# Patient Record
Sex: Male | Born: 1970 | Race: Black or African American | Hispanic: No | Marital: Married | Smoking: Former smoker
Health system: Southern US, Community
[De-identification: ages and names within clinical notes are randomized; demographics above are authoritative.]

## PROBLEM LIST (undated history)

## (undated) DIAGNOSIS — I5030 Unspecified diastolic (congestive) heart failure: Secondary | ICD-10-CM

## (undated) DIAGNOSIS — Z992 Dependence on renal dialysis: Secondary | ICD-10-CM

## (undated) DIAGNOSIS — N184 Chronic kidney disease, stage 4 (severe): Secondary | ICD-10-CM

## (undated) DIAGNOSIS — I77810 Thoracic aortic ectasia: Secondary | ICD-10-CM

## (undated) DIAGNOSIS — D649 Anemia, unspecified: Secondary | ICD-10-CM

## (undated) DIAGNOSIS — N186 End stage renal disease: Secondary | ICD-10-CM

## (undated) DIAGNOSIS — I340 Nonrheumatic mitral (valve) insufficiency: Secondary | ICD-10-CM

## (undated) DIAGNOSIS — I739 Peripheral vascular disease, unspecified: Secondary | ICD-10-CM

## (undated) DIAGNOSIS — E119 Type 2 diabetes mellitus without complications: Secondary | ICD-10-CM

## (undated) DIAGNOSIS — I10 Essential (primary) hypertension: Secondary | ICD-10-CM

## (undated) HISTORY — DX: Thoracic aortic ectasia: I77.810

## (undated) HISTORY — DX: Unspecified diastolic (congestive) heart failure: I50.30

## (undated) HISTORY — DX: Anemia, unspecified: D64.9

## (undated) HISTORY — DX: Peripheral vascular disease, unspecified: I73.9

## (undated) HISTORY — DX: Chronic kidney disease, stage 4 (severe): N18.4

## (undated) HISTORY — DX: Nonrheumatic mitral (valve) insufficiency: I34.0

## (undated) HISTORY — PX: LEG SURGERY: SHX1003

---

## 2008-02-05 ENCOUNTER — Emergency Department (HOSPITAL_COMMUNITY): Admission: EM | Admit: 2008-02-05 | Discharge: 2008-02-05 | Payer: Self-pay | Admitting: Emergency Medicine

## 2010-10-07 ENCOUNTER — Emergency Department (HOSPITAL_COMMUNITY)
Admission: EM | Admit: 2010-10-07 | Discharge: 2010-10-07 | Disposition: A | Discharge status: Home / Self Care | Payer: Self-pay | Attending: Emergency Medicine | Admitting: Emergency Medicine

## 2010-10-07 DIAGNOSIS — R5381 Other malaise: Secondary | ICD-10-CM | POA: Insufficient documentation

## 2010-10-07 DIAGNOSIS — R197 Diarrhea, unspecified: Secondary | ICD-10-CM | POA: Insufficient documentation

## 2010-10-07 DIAGNOSIS — Z833 Family history of diabetes mellitus: Secondary | ICD-10-CM | POA: Insufficient documentation

## 2010-10-07 DIAGNOSIS — R7309 Other abnormal glucose: Secondary | ICD-10-CM | POA: Insufficient documentation

## 2010-10-07 LAB — DIFFERENTIAL
Basophils Absolute: 0.1 10*3/uL (ref 0.0–0.1)
Eosinophils Relative: 3 % (ref 0–5)
Lymphocytes Relative: 48 % — ABNORMAL HIGH (ref 12–46)
Neutro Abs: 1.8 10*3/uL (ref 1.7–7.7)
Neutrophils Relative %: 34 % — ABNORMAL LOW (ref 43–77)

## 2010-10-07 LAB — BLOOD GAS, VENOUS
Bicarbonate: 21.1 mEq/L (ref 20.0–24.0)
O2 Saturation: 46.1 %
TCO2: 19.4 mmol/L (ref 0–100)
pH, Ven: 7.292 (ref 7.250–7.300)
pO2, Ven: 26 mmHg — CL (ref 30.0–45.0)

## 2010-10-07 LAB — URINALYSIS, ROUTINE W REFLEX MICROSCOPIC
Glucose, UA: >1000 mg/dL — AB
Hgb urine dipstick: NEGATIVE
Leukocytes, UA: NEGATIVE
Protein, ur: NEGATIVE mg/dL
Urobilinogen, UA: 0.2 mg/dL (ref 0.0–1.0)
pH: 5.5 (ref 5.0–8.0)

## 2010-10-07 LAB — BASIC METABOLIC PANEL
BUN: 18 mg/dL (ref 6–23)
CO2: 18 mEq/L — ABNORMAL LOW (ref 19–32)
Calcium: 10.1 mg/dL (ref 8.4–10.5)
Chloride: 94 mEq/L — ABNORMAL LOW (ref 96–112)
GFR calc Af Amer: >60 mL/min (ref >60)

## 2010-10-07 LAB — URINE MICROSCOPIC-ADD ON

## 2010-10-07 LAB — CBC
Hemoglobin: 15.4 g/dL (ref 13.0–17.0)
MCV: 81.6 fL (ref 78.0–100.0)
Platelets: 291 10*3/uL (ref 150–400)
RBC: 5.34 MIL/uL (ref 4.22–5.81)
WBC: 5.3 10*3/uL (ref 4.0–10.5)

## 2010-10-07 LAB — GLUCOSE, CAPILLARY
Glucose-Capillary: 212 mg/dL — ABNORMAL HIGH (ref 70–99)
Glucose-Capillary: 215 mg/dL — ABNORMAL HIGH (ref 70–99)

## 2014-11-09 ENCOUNTER — Emergency Department (INDEPENDENT_AMBULATORY_CARE_PROVIDER_SITE_OTHER)
Admission: EM | Admit: 2014-11-09 | Discharge: 2014-11-09 | Disposition: A | Discharge status: Home / Self Care | Payer: Self-pay | Source: Home / Self Care

## 2014-11-09 ENCOUNTER — Encounter (HOSPITAL_COMMUNITY): Payer: Self-pay | Admitting: Emergency Medicine

## 2014-11-09 DIAGNOSIS — R0789 Other chest pain: Secondary | ICD-10-CM

## 2014-11-09 DIAGNOSIS — M542 Cervicalgia: Secondary | ICD-10-CM

## 2014-11-09 MED ORDER — CYCLOBENZAPRINE HCL 10 MG PO TABS: 10.0000 mg | ORAL_TABLET | Freq: Two times a day (BID) | ORAL | Status: DC | PRN

## 2014-11-09 MED ORDER — NAPROXEN 375 MG PO TABS: 375.0000 mg | ORAL_TABLET | Freq: Two times a day (BID) | ORAL | Status: DC

## 2014-11-09 NOTE — Discharge Instructions (Signed)
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
## 2016-07-24 ENCOUNTER — Encounter (HOSPITAL_COMMUNITY): Payer: Self-pay

## 2016-07-24 ENCOUNTER — Emergency Department (HOSPITAL_COMMUNITY)
Admission: EM | Admit: 2016-07-24 | Discharge: 2016-07-24 | Disposition: A | Discharge status: Home / Self Care | Payer: Self-pay | Attending: Emergency Medicine | Admitting: Emergency Medicine

## 2016-07-24 DIAGNOSIS — F17210 Nicotine dependence, cigarettes, uncomplicated: Secondary | ICD-10-CM | POA: Insufficient documentation

## 2016-07-24 DIAGNOSIS — L0231 Cutaneous abscess of buttock: Secondary | ICD-10-CM | POA: Insufficient documentation

## 2016-07-24 DIAGNOSIS — E1165 Type 2 diabetes mellitus with hyperglycemia: Secondary | ICD-10-CM | POA: Insufficient documentation

## 2016-07-24 DIAGNOSIS — L03317 Cellulitis of buttock: Secondary | ICD-10-CM

## 2016-07-24 DIAGNOSIS — R739 Hyperglycemia, unspecified: Secondary | ICD-10-CM

## 2016-07-24 HISTORY — DX: Type 2 diabetes mellitus without complications: E11.9

## 2016-07-24 LAB — CBC WITH DIFFERENTIAL/PLATELET
BASOS PCT: 0 %
Basophils Absolute: 0 10*3/uL (ref 0.0–0.1)
EOS PCT: 2 %
Eosinophils Absolute: 0.3 10*3/uL (ref 0.0–0.7)
HCT: 38.9 % — ABNORMAL LOW (ref 39.0–52.0)
Hemoglobin: 13.7 g/dL (ref 13.0–17.0)
LYMPHS ABS: 2.3 10*3/uL (ref 0.7–4.0)
Lymphocytes Relative: 15 %
MCH: 29.6 pg (ref 26.0–34.0)
MCHC: 35.2 g/dL (ref 30.0–36.0)
MCV: 84 fL (ref 78.0–100.0)
MONO ABS: 1.2 10*3/uL — AB (ref 0.1–1.0)
Monocytes Relative: 8 %
NEUTROS ABS: 11.2 10*3/uL — AB (ref 1.7–7.7)
Neutrophils Relative %: 75 %
Platelets: 261 10*3/uL (ref 150–400)
RBC: 4.63 MIL/uL (ref 4.22–5.81)
RDW: 12.5 % (ref 11.5–15.5)
WBC: 15 10*3/uL — ABNORMAL HIGH (ref 4.0–10.5)

## 2016-07-24 LAB — BASIC METABOLIC PANEL
Anion gap: 11 (ref 5–15)
BUN: 11 mg/dL (ref 6–20)
CALCIUM: 8.9 mg/dL (ref 8.9–10.3)
CHLORIDE: 97 mmol/L — AB (ref 101–111)
CO2: 21 mmol/L — AB (ref 22–32)
CREATININE: 1.01 mg/dL (ref 0.61–1.24)
GFR calc non Af Amer: >60 mL/min (ref >60)
GLUCOSE: 390 mg/dL — AB (ref 65–99)
Potassium: 4.1 mmol/L (ref 3.5–5.1)
Sodium: 129 mmol/L — ABNORMAL LOW (ref 135–145)

## 2016-07-24 MED ORDER — HYDROMORPHONE HCL 1 MG/ML IJ SOLN
1.0000 mg | Freq: Once | INTRAMUSCULAR | Status: AC
  Administered 2016-07-24: 1 mg via INTRAMUSCULAR
  Filled 2016-07-24: qty 1

## 2016-07-24 MED ORDER — LIDOCAINE-EPINEPHRINE (PF) 2 %-1:200000 IJ SOLN
20.0000 mL | Freq: Once | INTRAMUSCULAR | Status: AC
  Administered 2016-07-24: 20 mL
  Filled 2016-07-24: qty 20

## 2016-07-24 MED ORDER — HYDROCODONE-ACETAMINOPHEN 5-325 MG PO TABS: 1.0000 | ORAL_TABLET | ORAL | 0 refills | Status: DC | PRN

## 2016-07-24 MED ORDER — IBUPROFEN 600 MG PO TABS: 600.0000 mg | ORAL_TABLET | Freq: Four times a day (QID) | ORAL | 0 refills | Status: DC | PRN

## 2016-07-24 MED ORDER — LIDOCAINE-EPINEPHRINE-TETRACAINE (LET) SOLUTION
3.0000 mL | Freq: Once | NASAL | Status: AC
  Administered 2016-07-24: 3 mL via TOPICAL
  Filled 2016-07-24: qty 3

## 2016-07-24 MED ORDER — SULFAMETHOXAZOLE-TRIMETHOPRIM 800-160 MG PO TABS: 1.0000 | ORAL_TABLET | Freq: Two times a day (BID) | ORAL | 0 refills | Status: AC

## 2016-07-24 MED ORDER — SULFAMETHOXAZOLE-TRIMETHOPRIM 800-160 MG PO TABS
1.0000 | ORAL_TABLET | Freq: Once | ORAL | Status: AC
  Administered 2016-07-24: 1 via ORAL
  Filled 2016-07-24: qty 1

## 2016-07-24 NOTE — Discharge Instructions (Signed)
Please come back in 2 days or to urgent care in 2 days for wound re-check. Your packing can be taken out in 2 days.  Take antibiotics as prescribed.do not forget to take your diabetes medication. Drink plenty of fluids.  Return without fail for worsening symptoms, including fever, intractable vomiting, confusion, worsening swelling/pain, or any other symptoms concerning ot you.

## 2016-07-24 NOTE — ED Notes (Signed)
ED Provider at bedside. 

## 2016-07-24 NOTE — ED Triage Notes (Signed)
Patient complains of abscess to buttocks x 4 days. No drainage, but pain and swelling. Described as near rectum, hx of same. NAD

## 2016-07-24 NOTE — ED Provider Notes (Signed)
[facility] DEPT Provider Note   CSN: [id number] Arrival date & time: 07/24/16  4098     History   Chief Complaint Chief Complaint  Patient presents with  . Abscess    HPI Samuel Gardner is a 46 y.o. male.  The history is provided by the patient.  Abscess  Location:  Pelvis Pelvic abscess location:  L buttock Abscess quality: fluctuance, induration, painful, redness and warmth   Abscess quality: not draining   Duration:  4 days Progression:  Worsening Pain details:    Quality:  Sharp, pressure and shooting   Severity:  Severe   Duration:  4 days   Timing:  Constant   Progression:  Worsening Chronicity:  New Context: diabetes   Relieved by:  Nothing Worsened by:  Nothing Ineffective treatments:  None tried Associated symptoms: fever (subjective)   Associated symptoms: no headaches   Risk factors: no prior abscess    46 year old male who presents with right buttock abscess. History of DM. Felt a pimple over right buttock 4 days ago, gradually enlarging and becoming more painful since trying to pop it. Subjective fevers and chills. No nausea, vomiting, malaise, confusion.   Past Medical History:  Diagnosis Date  . Diabetes mellitus without complication ([place])     There are no active problems to display for this patient.   History reviewed. No pertinent surgical history.     Home Medications    Prior to Admission medications   Medication Sig Start Date End Date Taking? Authorizing Provider  cyclobenzaprine (FLEXERIL) 10 MG tablet Take 1 tablet (10 mg total) by mouth 2 (two) times daily as needed for muscle spasms. Patient not taking: Reported on 07/24/2016 11/09/14   Lysbeth Penner, FNP  HYDROcodone-acetaminophen (NORCO/VICODIN) 5-325 MG tablet Take 1-2 tablets by mouth every 4 (four) hours as needed for severe pain. 07/24/16   Forde Dandy, MD  ibuprofen (ADVIL,MOTRIN) 600 MG tablet Take 1 tablet (600 mg total) by mouth every 6 (six) hours as needed for  moderate pain. 07/24/16   Forde Dandy, MD  naproxen (NAPROSYN) 375 MG tablet Take 1 tablet (375 mg total) by mouth 2 (two) times daily. Patient not taking: Reported on 07/24/2016 11/09/14   Lysbeth Penner, FNP  sulfamethoxazole-trimethoprim (BACTRIM DS,SEPTRA DS) 800-160 MG tablet Take 1 tablet by mouth 2 (two) times daily. 07/24/16 07/31/16  Forde Dandy, MD    Family History No family history on file.  Social History Social History  Substance Use Topics  . Smoking status: Current Every Day Smoker    Packs/day: 1.00    Types: Cigarettes  . Smokeless tobacco: Never Used  . Alcohol use Yes     Allergies   Patient has no known allergies.   Review of Systems Review of Systems  Constitutional: Positive for fever (subjective).  Respiratory: Negative for shortness of breath.   Cardiovascular: Negative for chest pain.  Skin:       Abscess   Allergic/Immunologic: Positive for immunocompromised state.  Neurological: Negative for headaches.  Hematological: Does not bruise/bleed easily.  Psychiatric/Behavioral: Negative for confusion.  All other systems reviewed and are negative.    Physical Exam Updated Vital Signs BP (!) 150/89 (BP Location: Right Arm)   Pulse (!) 105   Temp 99.5 F (37.5 C) (Oral)   Resp 16   SpO2 99%   Physical Exam Physical Exam  Nursing note and vitals reviewed. Constitutional: Well developed, well nourished, non-toxic, and in no acute distress Head: Normocephalic  and atraumatic.  Mouth/Throat: Oropharynx is clear and moist.  Neck: Normal range of motion. Neck supple.  Cardiovascular: Normal rate and regular rhythm.   Pulmonary/Chest: Effort normal and breath sounds normal.  Abdominal: Soft. There is no tenderness. There is no rebound and no guarding.  Musculoskeletal: Normal range of motion.  Neurological: Alert, no facial droop, fluent speech, moves all extremities symmetrically Skin: Skin is warm and dry. area of warmth induration w/  underlying fluctuanceoverlying left buttock periannally, with erythema and induration surrounding  Psychiatric: Cooperative   ED Treatments / Results  Labs (all labs ordered are listed, but only abnormal results are displayed) Labs Reviewed  CBC WITH DIFFERENTIAL/PLATELET - Abnormal; Notable for the following:       Result Value   WBC 15.0 (*)    HCT 38.9 (*)    All other components within normal limits  BASIC METABOLIC PANEL - Abnormal; Notable for the following:    Sodium 129 (*)    Chloride 97 (*)    CO2 21 (*)    Glucose, Bld 390 (*)    All other components within normal limits    EKG  EKG Interpretation None       Radiology No results found.  Procedures .Marland KitchenIncision and Drainage Date/Time: 07/24/2016 12:03 PM Performed by: Brantley Stage DUO Authorized by: Brantley Stage DUO   Consent:    Consent obtained:  Verbal   Consent given by:  Patient   Risks discussed:  Bleeding, incomplete drainage and infection   Alternatives discussed:  No treatment Location:    Type:  Abscess   Location:  Anogenital   Anogenital location:  Perianal Pre-procedure details:    Skin preparation:  Betadine Anesthesia (see MAR for exact dosages):    Anesthesia method:  Local infiltration and topical application   Topical anesthetic:  LET   Local anesthetic:  Lidocaine 2% WITH epi Procedure type:    Complexity:  Simple Procedure details:    Needle aspiration: no     Incision types:  Single straight   Incision depth:  Submucosal   Scalpel blade:  11   Wound management:  Probed and deloculated   Drainage:  Purulent   Drainage amount:  Copious   Wound treatment:  Wound left open   Packing materials:  1/2 in gauze Post-procedure details:    Patient tolerance of procedure:  Tolerated well, no immediate complications   (including critical care time) EMERGENCY DEPARTMENT US SOFT TISSUE INTERPRETATION "Study: Limited Soft Tissue Ultrasound"  INDICATIONS: Soft tissue infection Multiple  views of the body part were obtained in real-time with a multi-frequency linear probe  PERFORMED BY: Myself IMAGES ARCHIVED?: No SIDE:Right  BODY PART:buttock INTERPRETATION:  Abcess present and Cellulitis present    Medications Ordered in ED Medications  sulfamethoxazole-trimethoprim (BACTRIM DS,SEPTRA DS) 800-160 MG per tablet 1 tablet (not administered)  HYDROmorphone (DILAUDID) injection 1 mg (1 mg Intramuscular Given 07/24/16 1107)  lidocaine-EPINEPHrine (XYLOCAINE W/EPI) 2 %-1:200000 (PF) injection 20 mL (20 mLs Infiltration Given 07/24/16 1109)  lidocaine-EPINEPHrine-tetracaine (LET) solution (3 mLs Topical Given 07/24/16 1110)     Initial Impression / Assessment and Plan / ED Course  I have reviewed the triage vital signs and the nursing notes.  Pertinent labs & imaging results that were available during my care of the patient were reviewed by me and considered in my medical decision making (see chart for details).     History of diabetes with 4 days of right buttock swelling. There is evidence of cellulitis and  abscess involving the right buttock perianally. Incision and drainage performed. Will start on Bactrim. I do feel he is appropriate for outpatient treatment is no fevers or major signs or symptoms of systemic illness. He does have hyperglycemia, but no obvious DKA is no anion gap and bicarbonate 21. Discussed return to ED or urgent care in 2 days for wound recheck. Strict return and follow-up instructions reviewed. He expressed understanding of all discharge instructions and felt comfortable with the plan of care.   Final Clinical Impressions(s) / ED Diagnoses   Final diagnoses:  Abscess of buttock, right  Cellulitis of buttock  Hyperglycemia    New Prescriptions New Prescriptions   HYDROCODONE-ACETAMINOPHEN (NORCO/VICODIN) 5-325 MG TABLET    Take 1-2 tablets by mouth every 4 (four) hours as needed for severe pain.   IBUPROFEN (ADVIL,MOTRIN) 600 MG TABLET    Take  1 tablet (600 mg total) by mouth every 6 (six) hours as needed for moderate pain.   SULFAMETHOXAZOLE-TRIMETHOPRIM (BACTRIM DS,SEPTRA DS) 800-160 MG TABLET    Take 1 tablet by mouth 2 (two) times daily.     Forde Dandy, MD 07/24/16 [phone]

## 2018-07-13 ENCOUNTER — Emergency Department (HOSPITAL_COMMUNITY): Payer: No Typology Code available for payment source

## 2018-07-13 ENCOUNTER — Other Ambulatory Visit: Payer: Self-pay

## 2018-07-13 ENCOUNTER — Emergency Department (HOSPITAL_COMMUNITY)
Admission: EM | Admit: 2018-07-13 | Discharge: 2018-07-13 | Disposition: A | Discharge status: Home / Self Care | Payer: No Typology Code available for payment source | Attending: Emergency Medicine | Admitting: Emergency Medicine

## 2018-07-13 ENCOUNTER — Encounter (HOSPITAL_COMMUNITY): Payer: Self-pay | Admitting: Emergency Medicine

## 2018-07-13 DIAGNOSIS — M25512 Pain in left shoulder: Secondary | ICD-10-CM | POA: Insufficient documentation

## 2018-07-13 DIAGNOSIS — Y929 Unspecified place or not applicable: Secondary | ICD-10-CM | POA: Insufficient documentation

## 2018-07-13 DIAGNOSIS — M542 Cervicalgia: Secondary | ICD-10-CM | POA: Diagnosis not present

## 2018-07-13 DIAGNOSIS — M549 Dorsalgia, unspecified: Secondary | ICD-10-CM | POA: Diagnosis not present

## 2018-07-13 DIAGNOSIS — E119 Type 2 diabetes mellitus without complications: Secondary | ICD-10-CM | POA: Diagnosis not present

## 2018-07-13 DIAGNOSIS — Y999 Unspecified external cause status: Secondary | ICD-10-CM | POA: Insufficient documentation

## 2018-07-13 DIAGNOSIS — Y939 Activity, unspecified: Secondary | ICD-10-CM | POA: Diagnosis not present

## 2018-07-13 DIAGNOSIS — S4992XA Unspecified injury of left shoulder and upper arm, initial encounter: Secondary | ICD-10-CM | POA: Diagnosis present

## 2018-07-13 MED ORDER — OXYCODONE-ACETAMINOPHEN 5-325 MG PO TABS
1.0000 | ORAL_TABLET | Freq: Once | ORAL | Status: AC
  Administered 2018-07-13: 1 via ORAL
  Filled 2018-07-13: qty 1

## 2018-07-13 MED ORDER — HYDROCODONE-ACETAMINOPHEN 5-325 MG PO TABS: 1.0000 | ORAL_TABLET | Freq: Four times a day (QID) | ORAL | 0 refills | Status: DC | PRN
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
## 2018-07-13 NOTE — ED Notes (Signed)
Patient transported to X-ray 

## 2018-07-13 NOTE — Discharge Instructions (Addendum)
You were seen today for pain in your left shoulder. Please read and follow all provided instructions.    1. Medications: alternate ibuprofen and tylenol for pain control, usual home medications  2. Treatment: rest, ice, elevate and use sling, drink plenty of fluids, gentle stretching using the attached exercises.  Make sure you move the shoulder multiple times throughout the day to avoid getting frozen shoulder.  3. Follow Up: Please followup with orthopedics in the next 2-5 days for discussion of your diagnoses and further evaluation after today's visit; Please return to the ER for worsening symptoms or other concerns

## 2018-07-17 ENCOUNTER — Ambulatory Visit: Payer: Self-pay | Admitting: Orthopaedic Surgery

## 2018-07-17 ENCOUNTER — Other Ambulatory Visit: Payer: Self-pay | Admitting: Physician Assistant

## 2018-07-17 ENCOUNTER — Other Ambulatory Visit: Payer: Self-pay

## 2018-07-17 ENCOUNTER — Encounter: Payer: Self-pay | Admitting: Orthopaedic Surgery

## 2018-07-17 ENCOUNTER — Telehealth: Payer: Self-pay | Admitting: Orthopaedic Surgery

## 2018-07-17 VITALS — Ht 67.0 in | Wt 165.0 lb

## 2018-07-17 DIAGNOSIS — S43102A Unspecified dislocation of left acromioclavicular joint, initial encounter: Secondary | ICD-10-CM | POA: Insufficient documentation

## 2018-07-17 MED ORDER — HYDROCODONE-ACETAMINOPHEN 5-325 MG PO TABS: 1.0000 | ORAL_TABLET | Freq: Three times a day (TID) | ORAL | 0 refills | Status: DC | PRN

## 2018-07-17 NOTE — Telephone Encounter (Signed)
Please advise 

## 2018-07-17 NOTE — Telephone Encounter (Signed)
Patient called left voicemail message needing something for pain. The number to contact patient is [phone]
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
## 2018-07-17 NOTE — Telephone Encounter (Signed)
Just sent in.  Can tear up paper rx

## 2018-07-27 ENCOUNTER — Telehealth: Payer: Self-pay | Admitting: Orthopaedic Surgery

## 2018-07-27 NOTE — Telephone Encounter (Signed)
Patient called to request refill of Hydrocodone.  Please call patient to advise.  [phone]

## 2018-07-30 MED ORDER — HYDROCODONE-ACETAMINOPHEN 5-325 MG PO TABS: 1.0000 | ORAL_TABLET | Freq: Two times a day (BID) | ORAL | 0 refills | Status: DC | PRN

## 2018-07-30 NOTE — Telephone Encounter (Signed)
Please advise 

## 2018-07-30 NOTE — Telephone Encounter (Signed)
Just sent in

## 2018-08-07 ENCOUNTER — Encounter: Payer: Self-pay | Admitting: Orthopaedic Surgery

## 2018-08-07 ENCOUNTER — Ambulatory Visit (INDEPENDENT_AMBULATORY_CARE_PROVIDER_SITE_OTHER): Payer: Self-pay | Admitting: Orthopaedic Surgery

## 2018-08-07 ENCOUNTER — Other Ambulatory Visit: Payer: Self-pay

## 2018-08-07 DIAGNOSIS — S43102D Unspecified dislocation of left acromioclavicular joint, subsequent encounter: Secondary | ICD-10-CM

## 2018-08-07 MED ORDER — TRAMADOL HCL 50 MG PO TABS: 50.0000 mg | ORAL_TABLET | Freq: Four times a day (QID) | ORAL | 0 refills | Status: DC | PRN

## 2018-08-07 NOTE — Progress Notes (Signed)
   Office Visit Note   Patient: Samuel Gardner           Date of Birth: January 12, 1971           MRN: [id number] Visit Date: 08/07/2018              Requested by: No referring provider defined for this encounter. PCP: Patient, No Pcp Per   Assessment & Plan: Visit Diagnoses:  1. AC separation, type 2, left, subsequent encounter     Plan: Impression is 3-1/2 weeks status post grade 2 AC separation, left shoulder.  We will start the patient in formal physical therapy for range of motion strengthening exercises.  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  He notes that there is no light duty option.  He will follow-up with [place] in 3 weeks time for repeat evaluation.  Call with concerns or questions in the meantime.  Follow-Up Instructions: Return in about 3 weeks (around 08/28/2018).   Orders:  No orders of the defined types were placed in this encounter.  Meds ordered this encounter  Medications  . traMADol (ULTRAM) 50 MG tablet    Sig: Take 1 tablet (50 mg total) by mouth every 6 (six) hours as needed.    Dispense:  20 tablet    Refill:  0      Procedures: No procedures performed   Clinical Data: No additional findings.   Subjective: Chief Complaint  Patient presents with  . Left Shoulder - Pain    HPI patient is a pleasant 48 year old gentleman who presents our clinic today approximately 3-1/2 weeks out left shoulder grade 2 AC separation, date of injury 07/13/2018.  He has been doing well.  He has been compliant on weightbearing left upper extremity.  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  He has been taking Norco for pain.  Review of Systems as detailed in HPI.  All others reviewed and are negative.   Objective: Vital Signs: There were no vitals taken for this visit.  Physical Exam well-developed well-nourished gentleman in no acute distress.  Alert and oriented  x3.  Ortho Exam examination of his left shoulder reveals moderate tenderness the AC joint.  He has approximately 75% active range of motion, but with pain.  He has neurovascularly intact distally.  Specialty Comments:  No specialty comments available.  Imaging: No new imaging   PMFS History: There are no active problems to display for this patient.  Past Medical History:  Diagnosis Date  . Diabetes mellitus without complication ([place])     History reviewed. No pertinent family history.  Past Surgical History:  Procedure Laterality Date  . LEG SURGERY Right    teenager   Social History   Occupational History  . Not on file  Tobacco Use  . Smoking status: Current Every Day Smoker    Packs/day: 1.00    Types: Cigarettes  . Smokeless tobacco: Never Used  Substance and Sexual Activity  . Alcohol use: Yes  . Drug use: No  . Sexual activity: Not on file

## 2018-08-11 ENCOUNTER — Telehealth: Payer: Self-pay | Admitting: Cardiology

## 2018-08-11 NOTE — Telephone Encounter (Signed)
Attempted to call pt, no answer and voice mail not set up.  Call was in regards to recent office visit 5/26 and possible Covid exposure and to offer testing.

## 2018-08-17 ENCOUNTER — Telehealth: Payer: Self-pay | Admitting: *Deleted
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
## 2018-08-21 ENCOUNTER — Telehealth: Payer: Self-pay | Admitting: Orthopaedic Surgery

## 2018-08-21 ENCOUNTER — Other Ambulatory Visit: Payer: Self-pay

## 2018-08-21 ENCOUNTER — Telehealth: Payer: Self-pay | Admitting: *Deleted

## 2018-08-21 ENCOUNTER — Ambulatory Visit: Payer: No Typology Code available for payment source | Admitting: Physical Therapy

## 2018-08-21 DIAGNOSIS — Z20822 Contact with and (suspected) exposure to covid-19: Secondary | ICD-10-CM

## 2018-08-21 MED ORDER — TRAMADOL HCL 50 MG PO TABS: 50.0000 mg | ORAL_TABLET | Freq: Three times a day (TID) | ORAL | 0 refills | Status: DC | PRN

## 2018-08-21 NOTE — Telephone Encounter (Signed)
Patient called requesting a refill on pain medication traMADol (ULTRAM) 50 MG tablet. Please call patient if patient can get refill or not

## 2018-08-21 NOTE — Telephone Encounter (Signed)
Just sent in

## 2018-08-21 NOTE — Telephone Encounter (Signed)
Pt scheduled for covid 19 testing at [facility] site today. Possible exposure through [facility] visit. States is 14 days out asymptomatic but P.T would not see until pt tested.   Testing process reviewed; directions, drive through, advised to wear mask,  as well as any passengers in vehicle. Verbalizes understanding.   Pts Cb # [phone]

## 2018-08-22 LAB — NOVEL CORONAVIRUS, NAA: SARS-CoV-2, NAA: NOT DETECTED

## 2018-08-23 ENCOUNTER — Telehealth: Payer: Self-pay | Admitting: General Practice

## 2018-08-23 NOTE — Telephone Encounter (Signed)
Advised patient COVID 19 test results are negative.

## 2018-08-27 ENCOUNTER — Telehealth: Payer: Self-pay | Admitting: Orthopaedic Surgery

## 2018-08-27 NOTE — Telephone Encounter (Signed)
Patient called in wanting to know if someone could get his PT set up being that his Covid results came back negative,. Also patient is requesting to come in tomorrow because his work note releases him tomorrow and he doesn't think he should be release just yet.  Please call patient and advise.

## 2018-08-27 NOTE — Telephone Encounter (Signed)
Talked with patient and advised him that he would need to call PT and R/S his appointment.  Patient has appointment for 08/28/2018 with Dr. Erlinda Hong.

## 2018-08-28 ENCOUNTER — Other Ambulatory Visit: Payer: Self-pay

## 2018-08-28 ENCOUNTER — Ambulatory Visit (INDEPENDENT_AMBULATORY_CARE_PROVIDER_SITE_OTHER): Payer: Self-pay | Admitting: Orthopaedic Surgery

## 2018-08-28 ENCOUNTER — Encounter: Payer: Self-pay | Admitting: Orthopaedic Surgery

## 2018-08-28 DIAGNOSIS — S43102D Unspecified dislocation of left acromioclavicular joint, subsequent encounter: Secondary | ICD-10-CM

## 2018-08-28 NOTE — Progress Notes (Signed)
   Office Visit Note   Patient: Samuel Gardner           Date of Birth: 08/26/1970           MRN: [id number] Visit Date: 08/28/2018              Requested by: No referring provider defined for this encounter. PCP: Patient, No Pcp Per   Assessment & Plan: Visit Diagnoses:  1. AC separation, type 2, left, subsequent encounter     Plan: Impression is 6 weeks status post left type II AC separation.  Patient is doing well.  At this point he may return to work on light duty with less than 10 pounds of lifting but it sounds like there is no light duty at his job therefore we will hold him out for another 6 weeks until he can go back to work 100%.  Follow-Up Instructions: Return in about 6 weeks (around 10/09/2018).   Orders:  No orders of the defined types were placed in this encounter.  No orders of the defined types were placed in this encounter.     Procedures: No procedures performed   Clinical Data: No additional findings.   Subjective: Chief Complaint  Patient presents with  . Left Shoulder - Follow-up    Samuel Gardner follows up today for his type II left AC separation.  He is 6 weeks from the injury.  He states that he is feeling much better and physical therapy has improved his symptoms significantly.  He still has a constant soreness that is worse with lifting.   Review of Systems   Objective: Vital Signs: There were no vitals taken for this visit.  Physical Exam  Ortho Exam Left shoulder exam is unchanged.  He has a lot less discomfort with cross body adduction. Specialty Comments:  No specialty comments available.  Imaging: No results found.   PMFS History: There are no active problems to display for this patient.  Past Medical History:  Diagnosis Date  . Diabetes mellitus without complication ([place])     History reviewed. No pertinent family history.  Past Surgical History:  Procedure Laterality Date  . LEG SURGERY Right    teenager   Social  History   Occupational History  . Not on file  Tobacco Use  . Smoking status: Current Every Day Smoker    Packs/day: 1.00    Types: Cigarettes  . Smokeless tobacco: Never Used  Substance and Sexual Activity  . Alcohol use: Yes  . Drug use: No  . Sexual activity: Not on file

## 2018-08-30 ENCOUNTER — Other Ambulatory Visit: Payer: Self-pay

## 2018-08-30 ENCOUNTER — Ambulatory Visit: Payer: No Typology Code available for payment source | Attending: Orthopaedic Surgery | Admitting: Physical Therapy

## 2018-08-30 ENCOUNTER — Encounter: Payer: Self-pay | Admitting: Physical Therapy

## 2018-08-30 DIAGNOSIS — M6281 Muscle weakness (generalized): Secondary | ICD-10-CM | POA: Insufficient documentation

## 2018-08-30 DIAGNOSIS — M25512 Pain in left shoulder: Secondary | ICD-10-CM | POA: Diagnosis not present
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
## 2018-09-06 ENCOUNTER — Ambulatory Visit: Payer: No Typology Code available for payment source | Admitting: Physical Therapy

## 2018-09-06 ENCOUNTER — Other Ambulatory Visit: Payer: Self-pay

## 2018-09-06 ENCOUNTER — Encounter: Payer: Self-pay | Admitting: Physical Therapy

## 2018-09-06 ENCOUNTER — Telehealth: Payer: Self-pay | Admitting: Orthopaedic Surgery

## 2018-09-06 DIAGNOSIS — M25512 Pain in left shoulder: Secondary | ICD-10-CM | POA: Diagnosis not present

## 2018-09-06 DIAGNOSIS — M6281 Muscle weakness (generalized): Secondary | ICD-10-CM
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
## 2018-09-06 NOTE — Telephone Encounter (Signed)
pls advise

## 2018-09-06 NOTE — Telephone Encounter (Signed)
Patient called needing Rx refilled (Hydrocodone) The number to contact patient is [phone]

## 2018-09-07 ENCOUNTER — Other Ambulatory Visit: Payer: Self-pay

## 2018-09-07 ENCOUNTER — Ambulatory Visit: Payer: No Typology Code available for payment source | Admitting: Physical Therapy

## 2018-09-07 ENCOUNTER — Telehealth: Payer: Self-pay | Admitting: Orthopaedic Surgery

## 2018-09-07 ENCOUNTER — Encounter: Payer: Self-pay | Admitting: Physical Therapy

## 2018-09-07 DIAGNOSIS — M6281 Muscle weakness (generalized): Secondary | ICD-10-CM

## 2018-09-07 DIAGNOSIS — M25512 Pain in left shoulder: Secondary | ICD-10-CM

## 2018-09-07 MED ORDER — TRAMADOL HCL 50 MG PO TABS: 50.0000 mg | ORAL_TABLET | Freq: Three times a day (TID) | ORAL | 0 refills | Status: DC | PRN

## 2018-09-07 NOTE — Telephone Encounter (Signed)
Called patient back VM not set up.

## 2018-09-07 NOTE — Telephone Encounter (Signed)
Can call in tramadol, but nothing stronger.  Let me know if he would like that

## 2018-09-07 NOTE — Therapy (Signed)
[place], [place], [address] Phone: [phone]   Fax:  [phone]  Physical Therapy Treatment  Patient Details  Name: Samuel Gardner MRN: [id number] Date of Birth: 09-Sep-1970 Referring Provider (PT): Frankey Shown, MD   Encounter Date: 09/07/2018  PT End of Session - 09/07/18 1134    Visit Number  3    Number of Visits  12    Date for PT Re-Evaluation  10/11/18    Authorization Type  Medpay/Medpay assurance    PT Start Time  1128    PT Stop Time  1213    PT Time Calculation (min)  45 min    Activity Tolerance  Patient tolerated treatment well    Behavior During Therapy  [facility] for tasks assessed/performed       Past Medical History:  Diagnosis Date  . Diabetes mellitus without complication [facility])     Past Surgical History:  Procedure Laterality Date  . LEG SURGERY Right    teenager    There were no vitals filed for this visit.  Subjective Assessment - 09/07/18 1129    Subjective  Pt arriving to therapy reporting 2/10 in his left shoulder. Pt reporting more tightness than pain. Pt stated he has been doing his HEP and working with the bands but he didn't want to "over do it."    Pertinent History  Diabetic, no other significant PMH    Limitations  Lifting    Diagnostic tests  X-rays    Patient Stated Goals  Improve shoulder strength and reaching ability, return to work and PLOF    Currently in Pain?  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    Referring Provider (PT)  Frankey Shown, MD    Onset Date/Surgical Date  07/13/18    Hand Dominance  Right      AROM   Left Shoulder Flexion  160 Degrees    Left Shoulder ABduction  150 Degrees    Left Shoulder  External Rotation  55 Degrees                   [facility] Adult PT Treatment/Exercise - 09/07/18 0001      Exercises   Exercises  Shoulder      Shoulder Exercises: Prone   Other Prone Exercises   Y's, T's and W's x15 reps      Shoulder Exercises: Standing   External Rotation  Strengthening;15 reps;Theraband    Theraband Level (Shoulder External Rotation)  Level 3 (Green)    Internal Rotation  Strengthening;15 reps;Theraband    Theraband Level (Shoulder Internal Rotation)  Level 3 (Green)    Flexion  Strengthening;15 reps;Theraband    Theraband Level (Shoulder Flexion)  Level 3 (Green)    Extension  Strengthening;15 reps;Theraband    Theraband Level (Shoulder Extension)  Level 3 (Green)    Row  Strengthening;Both;15 reps;Theraband    Theraband Level (Shoulder Row)  Level 3 (Green)    Diagonals  Strengthening;Left;15 reps;Theraband   DI and D2   Theraband Level (Shoulder Diagonals)  Level 2 (Red)  Diagonals Limitations  D2 with yellow theraband    Other Standing Exercises  wall pushups elbows in and then out x 15 reps    Other Standing Exercises  rebounder 5 pound ball both hands, red ball left hand only [id number]      Shoulder Exercises: Pulleys   Flexion  2 minutes    Scaption  2 minutes      Shoulder Exercises: ROM/Strengthening   UBE (Upper Arm Bike)  Level 1.0, 2 minutes forward and back      Manual Therapy   Manual Therapy  Passive ROM    Passive ROM  flexion, IR/ER, abduction             PT Education - 09/07/18 1134    Education Details  posture awareness    Person(s) Educated  Patient    Methods  Explanation;Demonstration    Comprehension  Verbalized understanding;Returned demonstration          PT Long Term Goals - 09/07/18 1140      PT LONG TERM GOAL #1   Title  Independent with HEP including advanced HEP for strengthening progression to assist return to work duties    Time  6    Period  Weeks    Status  On-going      PT LONG TERM GOAL  #2   Title  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    Baseline  42% limitation on 09/06/2018    Time  6    Period  Weeks    Status  Unable to assess            Plan - 09/07/18 1135    Clinical Impression Statement  Pt presenting today with more tightness in his L shoulder reporting 2/10 pain. Pt with improved AROM measurment today L shoulder flexion: 160 degrees, L shoulder ER 55degrees, L shoulder abduction: 150 degrees.  Pt tolerating all exercises well with no increased pain reported during session. Pt reporting feeling better after PROM. Continue with skilled PT to progress toward back to work goals.    Personal Factors and Comorbidities  Comorbidity 1    Comorbidities  Diabetic    Examination-Activity Limitations  Bathing;Hygiene/Grooming;Lift;Sleep;Dressing;Reach Overhead    Stability/Clinical Decision Making  Stable/Uncomplicated    PT Frequency  2x / week    PT Duration  6 weeks    PT Treatment/Interventions  ADLs/Self Care Home Management;Electrical Stimulation;Iontophoresis 4mg /ml Dexamethasone;Moist Heat;Ultrasound;Cryotherapy;Therapeutic activities;Therapeutic exercise;Neuromuscular re-education;Manual techniques;Dry needling;Passive range of motion;Taping    PT Next Visit Plan  Review HEP as needed, focus  left shoulder ROM and strengthening as pain permits, for flexion try pulleys, PROM as  needed, AROM most limited in flexion and IR, modalities prn for pain and inflammation    PT Home Exercise Plan  Theraband ER, IR, row, ext, [place] flexion, bicep curl, tricep ext, performing ROM also with wall walks/slides    Consulted and Agree with Plan of Care  Patient       Patient will benefit from skilled therapeutic intervention in order to improve the following deficits and impairments:  Pain, Impaired UE functional use, Decreased strength, Decreased range of motion  Visit Diagnosis: 1. Acute pain of left shoulder   2. Muscle weakness (generalized)        Problem List There are no active problems to display for this patient.   Oretha Caprice, PT 09/07/2018, 12:23 PM  [facility] [address] [place], [place], [address] Phone: [phone]   Fax:  [phone]  Name: Mayjor Ager MRN: [id number] Date of Birth: 1970-09-06

## 2018-09-07 NOTE — Telephone Encounter (Signed)
Called patient to advise on message below. No answer. Could not leave voicemail. Mailbox is not set up.

## 2018-09-10 ENCOUNTER — Other Ambulatory Visit: Payer: Self-pay

## 2018-09-10 ENCOUNTER — Encounter: Payer: Self-pay | Admitting: Physical Therapy

## 2018-09-10 ENCOUNTER — Ambulatory Visit: Payer: No Typology Code available for payment source | Admitting: Physical Therapy

## 2018-09-10 DIAGNOSIS — M25512 Pain in left shoulder: Secondary | ICD-10-CM

## 2018-09-10 DIAGNOSIS — M6281 Muscle weakness (generalized): Secondary | ICD-10-CM

## 2018-09-10 NOTE — Therapy (Signed)
[place], [place], [address] Phone: [phone]   Fax:  [phone]  Physical Therapy Treatment  Patient Details  Name: Samuel Gardner MRN: [id number] Date of Birth: Mar 15, 1970 Referring Provider (PT): Frankey Shown, MD   Encounter Date: 09/10/2018  PT End of Session - 09/10/18 1601    Visit Number  4    Number of Visits  12    Date for PT Re-Evaluation  10/11/18    Authorization Type  Medpay/Medpay assurance    PT Start Time  0358    PT Stop Time  0436    PT Time Calculation (min)  38 min       Past Medical History:  Diagnosis Date  . Diabetes mellitus without complication [facility])     Past Surgical History:  Procedure Laterality Date  . LEG SURGERY Right    teenager    There were no vitals filed for this visit.  Subjective Assessment - 09/10/18 1559    Subjective  Shoulder is getting better. I can reach better and I can lay down without as much pain.    Currently in Pain?  Yes    Pain Score  2     Pain Location  Shoulder    Pain Orientation  Left    Pain Descriptors / Indicators  Tightness    Pain Type  Acute pain    Aggravating Factors   holding arm out too long    Pain Relieving Factors  exercises, meds                       [facility] Adult PT Treatment/Exercise - 09/10/18 0001      Shoulder Exercises: Supine   Flexion  20 reps    Shoulder Flexion Weight (lbs)  2    Other Supine Exercises  flexion pullovers with green band isometric hold x 20       Shoulder Exercises: Prone   Other Prone Exercises  I, Y, T, W x 20 each       Shoulder Exercises: Sidelying   External Rotation  20 reps    External Rotation Weight (lbs)  2    ABduction  20 reps    ABduction Weight (lbs)  2      Shoulder Exercises: Standing   External Rotation  20 reps    Theraband Level (Shoulder External Rotation)  Level 3 (Green)    Internal Rotation  20 reps    Theraband Level (Shoulder Internal  Rotation)  Level 3 (Green)    Flexion  Strengthening;20 reps;Theraband    Theraband Level (Shoulder Flexion)  Level 3 (Green)    Extension  20 reps    Theraband Level (Shoulder Extension)  Level 3 (Green)    Row  20 reps    Theraband Level (Shoulder Row)  Level 3 (Green)    Diagonals  Strengthening;Left;15 reps;Theraband   DI and D2   Theraband Level (Shoulder Diagonals)  Level 2 (Red)    Diagonals Limitations  D2 with yellow theraband    Other Standing Exercises  wall pushups elbows in and then out x 15 reps      Shoulder Exercises: ROM/Strengthening   UBE (Upper Arm Bike)  Level 2.0, 2 minutes forward and back      Shoulder Exercises: Stretch   Corner Stretch Limitations  door way stretch x 3     Internal Rotation Stretch  3 reps    Internal Rotation Stretch Limitations  30 sec     Other Shoulder Stretches  flexion stretch in doorway, overhead x 3       Manual Therapy   Passive ROM  flexion, IR/ER, abduction                  PT Long Term Goals - 09/07/18 1140      PT LONG TERM GOAL #1   Title  Independent with HEP including advanced HEP for strengthening progression to assist return to work duties    Time  6    Period  Weeks    Status  On-going      PT LONG TERM GOAL #2   Title  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    Baseline  42% limitation on 09/06/2018    Time  6     Period  Weeks    Status  Unable to assess            Plan - 09/10/18 1638    Clinical Impression Statement  Pt reports significant improvement in pain. He no longer has pain with reaching or laying. He does note pain with sustaining a prolonged reach. Continued with scapular and RTC strengthening. Pain with end range IR. Began internal rotation stretching.    PT Next Visit Plan  Review HEP as needed, focus left shoulder ROM and strengthening, progress    PT Home Exercise Plan  Theraband ER, IR, row, ext, [place] flexion, bicep curl, tricep ext, performing ROM also with wall walks/slides       Patient will benefit from skilled therapeutic intervention in order to improve the following deficits and impairments:  Pain, Impaired UE functional use, Decreased strength, Decreased range of motion  Visit Diagnosis: 1. Acute pain of left shoulder   2. Muscle weakness (generalized)        Problem List There are no active problems to display for this patient.   Hessie Diener [place], [place] 09/10/2018, 4:42 PM  [facility] [address] [place], [place], [address] Phone: [phone]   Fax:  [phone]  Name: Samuel Gardner MRN: [id number] Date of Birth: 1970/11/16

## 2018-09-12 ENCOUNTER — Other Ambulatory Visit: Payer: Self-pay

## 2018-09-12 ENCOUNTER — Ambulatory Visit: Payer: No Typology Code available for payment source | Attending: Orthopaedic Surgery | Admitting: Physical Therapy

## 2018-09-12 ENCOUNTER — Encounter: Payer: Self-pay | Admitting: Physical Therapy

## 2018-09-12 DIAGNOSIS — M25512 Pain in left shoulder: Secondary | ICD-10-CM | POA: Diagnosis present

## 2018-09-12 DIAGNOSIS — M6281 Muscle weakness (generalized): Secondary | ICD-10-CM | POA: Insufficient documentation

## 2018-09-12 NOTE — Therapy (Signed)
[place], [place], [address] Phone: [phone]   Fax:  [phone]  Physical Therapy Treatment  Patient Details  Name: Samuel Gardner MRN: [id number] Date of Birth: 07/17/1970 Referring Provider (PT): Frankey Shown, MD   Encounter Date: 09/12/2018  PT End of Session - 09/12/18 1606    Visit Number  5    Number of Visits  12    Date for PT Re-Evaluation  10/11/18    Authorization Type  Medpay/Medpay assurance    PT Start Time  1522    PT Stop Time  1600    PT Time Calculation (min)  38 min    Activity Tolerance  Patient tolerated treatment well    Behavior During Therapy  [facility] for tasks assessed/performed       Past Medical History:  Diagnosis Date  . Diabetes mellitus without complication [facility])     Past Surgical History:  Procedure Laterality Date  . LEG SURGERY Right    teenager    There were no vitals filed for this visit.  Subjective Assessment - 09/12/18 1523    Subjective  Some increased stiffness in shoulder noted after last weekend but reports shoulder continues to get better.         [facility] PT Assessment - 09/12/18 0001      Strength   Left Shoulder Flexion  4/5    Left Shoulder ABduction  4/5    Left Shoulder Internal Rotation  4+/5    Left Shoulder External Rotation  4+/5    Left Elbow Flexion  5/5    Left Elbow Extension  4+/5                   [facility] Adult PT Treatment/Exercise - 09/12/18 0001      Elbow Exercises   Elbow Flexion  AROM;Strengthening;Left   3x10 with 4 lb. weight     Shoulder Exercises: Supine   Flexion  20 reps    Shoulder Flexion Weight (lbs)  3    Flexion Limitations  short arc flexion punch to serratus punch    Other Supine Exercises  supine rhythmic stabilization at 90 deg flexion 20 sec x 3    Other Supine Exercises  flexion pullovers with green band isometric hold x 20       Shoulder Exercises: Sidelying   External Rotation  20 reps     External Rotation Weight (lbs)  2    ABduction  20 reps    ABduction Weight (lbs)  2      Shoulder Exercises: Standing   External Rotation  AROM;Strengthening;20 reps    Theraband Level (Shoulder External Rotation)  Level 3 (Green)    External Rotation Limitations  with isometric into flexion punch every 3rd rep    Internal Rotation  Both;20 reps    Theraband Level (Shoulder Internal Rotation)  Level 4 (Blue)    Internal Rotation Limitations  crossing bilat. IR    Row  Strengthening;Left   3x10   Row Limitations  cable row on Freemotion with 7 lbs.    Diagonals  Strengthening;Left;20 reps   D1   Theraband Level (Shoulder Diagonals)  Level 2 (Red)    Other Standing Exercises  lift crate back and forth from 30 in. to 66 in. shelf height-started 10 lbs. but no challeng so increased to 20 lbs. total including crate weight and performed 8 repetitions    Other Standing Exercises  left unilat. Rockwood flexion with Freemotion  cable 7 lbs. 3x10      Shoulder Exercises: ROM/Strengthening   UBE (Upper Arm Bike)  Level 2.0, 5 min total with 2.5 min ea. fw/rev      Manual Therapy   Passive ROM  4-way PROM             PT Education - 09/12/18 1606    Education Details  exercises, progress/status with strength    Person(s) Educated  Patient    Methods  Explanation;Demonstration;Verbal cues    Comprehension  Verbalized understanding;Returned demonstration          PT Long Term Goals - 09/12/18 1609      PT LONG TERM GOAL #1   Title  Independent with HEP including advanced HEP for strengthening progression to assist return to work duties    Baseline  updated at eval    Time  6    Period  Weeks    Status  On-going      PT LONG TERM GOAL #2   Title  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 today but still with intermittent soreness    Time  6    Period  Weeks    Status  On-going      PT LONG TERM GOAL #4   Title  Return to work full duty pending MD clearance    Baseline  unable to work    Time  6    Period  Weeks    Status  On-going      PT LONG TERM GOAL #5   Title  Pt will improve FOTO score to </= 29% limitation.    Baseline  42% limitation on 09/06/2018    Time  6    Period  Weeks    Status  On-going            Plan - 09/12/18 1606    Clinical Impression Statement  Improving from baseline with left shoulder ROM. Strength also progressing but still with left shoulder weakness (see objective for MMTs) with associated functional limitations particularly for lifting requirements for return to full duty at work. Porgressed functional strengthening with crate lifts to simulate work activities-able to perform lifts with 20 lbs. using bilat. UE to 66 in. height but with associated muscle fatigue.    Stability/Clinical Decision Making  Stable/Uncomplicated    Clinical Decision Making  Low    Rehab Potential  Good    PT Frequency  2x / week    PT Duration  6 weeks    PT Treatment/Interventions  ADLs/Self Care Home Management;Electrical Stimulation;Iontophoresis 4mg /ml Dexamethasone;Moist Heat;Ultrasound;Cryotherapy;Therapeutic activities;Therapeutic exercise;Neuromuscular re-education;Manual techniques;Dry needling;Passive range of motion;Taping    PT Next Visit Plan  Continut strengthening progression emphasis    PT Home Exercise Plan  Theraband ER, IR, row, ext, [place] flexion, bicep curl, tricep ext, performing ROM also with wall walks/slides    Consulted and Agree with Plan of Care  Patient       Patient will benefit from skilled therapeutic intervention in order to improve the  following deficits and  impairments:  Pain, Impaired UE functional use, Decreased strength, Decreased range of motion  Visit Diagnosis: 1. Acute pain of left shoulder   2. Muscle weakness (generalized)        Problem List There are no active problems to display for this patient.   Beaulah Dinning, PT, DPT 09/12/18 4:11 PM  [facility] [address] [place], [place], [address] Phone: [phone]   Fax:  [phone]  Name: Sasha Rueth MRN: [id number] Date of Birth: 1970/04/02

## 2018-09-17 ENCOUNTER — Other Ambulatory Visit: Payer: Self-pay

## 2018-09-17 ENCOUNTER — Ambulatory Visit: Payer: No Typology Code available for payment source | Admitting: Physical Therapy

## 2018-09-17 ENCOUNTER — Encounter: Payer: Self-pay | Admitting: Physical Therapy

## 2018-09-17 DIAGNOSIS — M25512 Pain in left shoulder: Secondary | ICD-10-CM | POA: Diagnosis not present

## 2018-09-17 DIAGNOSIS — M6281 Muscle weakness (generalized): Secondary | ICD-10-CM

## 2018-09-17 NOTE — Therapy (Signed)
[place], [place], [address] Phone: [phone]   Fax:  [phone]  Physical Therapy Treatment  Patient Details  Name: Samuel Gardner MRN: [id number] Date of Birth: 1970-07-29 Referring Provider (PT): Frankey Shown, MD   Encounter Date: 09/17/2018  PT End of Session - 09/17/18 1021    Visit Number  6    Number of Visits  12    Date for PT Re-Evaluation  10/11/18    Authorization Type  Medpay/Medpay assurance    PT Start Time  1007    PT Stop Time  1047    PT Time Calculation (min)  40 min    Activity Tolerance  Patient tolerated treatment well    Behavior During Therapy  [facility] for tasks assessed/performed       Past Medical History:  Diagnosis Date  . Diabetes mellitus without complication [facility])     Past Surgical History:  Procedure Laterality Date  . LEG SURGERY Right    teenager    There were no vitals filed for this visit.  Subjective Assessment - 09/17/18 1008    Subjective  Pt reporting some stiffness/tightness over the weekend, but arm feels much better.    Pertinent History  Diabetic, no other significant PMH    Limitations  Lifting    Diagnostic tests  X-rays    Patient Stated Goals  Improve shoulder strength and reaching ability, return to work and PLOF    Currently in Pain?  No/denies    Aggravating Factors   lifting, reaching    Pain Relieving Factors  exercises, meds    Multiple Pain Sites  No                       [facility] Adult PT Treatment/Exercise - 09/17/18 0001      Shoulder Exercises: Supine   Flexion  20 reps    Shoulder Flexion Weight (lbs)  3    Other Supine Exercises  supine rhythmic stabilization at 90 deg flexion 20 sec x 3      Shoulder Exercises: Sidelying   External Rotation  20 reps    External Rotation Weight (lbs)  3    ABduction  20 reps    ABduction Weight (lbs)  3      Shoulder Exercises: Standing   External Rotation   AROM;Strengthening;20 reps    Theraband Level (Shoulder External Rotation)  Level 3 (Green)    External Rotation Limitations  with isometric into flexion punch every 3rd rep    Internal Rotation  Both;20 reps    Theraband Level (Shoulder Internal Rotation)  Level 4 (Blue)    Internal Rotation Limitations  crossing bilat. IR    Row  Strengthening;Left   3x10   Row Limitations  cable row on Freemotion with 7 lbs.    Diagonals  Strengthening;Left;20 reps   D1   Theraband Level (Shoulder Diagonals)  Level 2 (Red)    Other Standing Exercises  lifting crate from 33 inches to 66 inches with 20lbs including crate x 20 reps, pt reporting shoulder fatigue    Other Standing Exercises  lifting crate from floor to 33 inch height 20lbs x 20 reps, pt reporting fatigue during and after exercise      Shoulder Exercises: ROM/Strengthening   UBE (Upper Arm Bike)  Level 2.0, 6 min total with 2.5 min ea. fw/rev      Manual Therapy   Manual therapy comments  5 minutes  Passive ROM  PROM - 4 way   pt reporting PROM releaves the stiffness            PT Education - 09/17/18 1020    Education Details  exercise progression and PT POC    Person(s) Educated  Patient    Methods  Explanation    Comprehension  Verbalized understanding          PT Long Term Goals - 09/17/18 1028      PT LONG TERM GOAL #1   Title  Independent with HEP including advanced HEP for strengthening progression to assist return to work duties    Baseline  updated at eval    Time  6    Period  Weeks    Status  On-going      PT LONG TERM GOAL #2   Title  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 today but still with intermittent soreness    Time  6    Period  Weeks    Status  On-going      PT LONG TERM GOAL #4   Title  Return to work full duty pending MD clearance    Baseline  unable to work    Time  6    Period  Weeks      PT LONG TERM GOAL #5   Title  Pt will improve FOTO score to </= 29% limitation.    Baseline  42% limitation on 09/06/2018    Time  6    Period  Weeks    Status  On-going            Plan - 09/17/18 1022    Clinical Impression Statement  Pt making progress toward baseline level of functional mobility. Pt still not able to meet requirements for work due to lifting requirements. Pt reporting no pain during session only fatiue in left shoulder after exercises and lifting.    Personal Factors and Comorbidities  Comorbidity 1    Comorbidities  Diabetic    Examination-Activity Limitations  Bathing;Hygiene/Grooming;Lift;Sleep;Dressing;Reach Overhead    Stability/Clinical Decision Making  Stable/Uncomplicated    Rehab Potential  Good    PT Frequency  2x / week    PT Duration  6 weeks    PT Treatment/Interventions  ADLs/Self Care Home Management;Electrical Stimulation;Iontophoresis 4mg /ml Dexamethasone;Moist Heat;Ultrasound;Cryotherapy;Therapeutic activities;Therapeutic exercise;Neuromuscular re-education;Manual techniques;Dry needling;Passive range of motion;Taping    PT Next Visit Plan  Continut strengthening progression emphasis    PT Home Exercise Plan  Theraband ER, IR, row, ext, [place] flexion, bicep curl, tricep ext, performing ROM also with wall walks/slides    Consulted and Agree with Plan of Care  Patient       Patient will benefit from skilled therapeutic intervention in order to improve the following deficits and impairments:  Pain, Impaired UE functional use, Decreased strength, Decreased range of motion  Visit Diagnosis: 1. Acute pain of left shoulder   2. Muscle weakness (generalized)        Problem List There are  no active problems  to display for this patient.   Oretha Caprice, PT 09/17/2018, 10:52 AM  [facility] [address] [place], [place], [address] Phone: [phone]   Fax:  [phone]  Name: Samuel Gardner MRN: [id number] Date of Birth: 1970/04/05

## 2018-09-20 ENCOUNTER — Encounter: Payer: Self-pay | Admitting: Physical Therapy

## 2018-09-20 ENCOUNTER — Other Ambulatory Visit: Payer: Self-pay

## 2018-09-20 ENCOUNTER — Ambulatory Visit: Payer: No Typology Code available for payment source | Admitting: Physical Therapy

## 2018-09-20 DIAGNOSIS — M6281 Muscle weakness (generalized): Secondary | ICD-10-CM

## 2018-09-20 DIAGNOSIS — M25512 Pain in left shoulder: Secondary | ICD-10-CM | POA: Diagnosis not present

## 2018-09-20 NOTE — Therapy (Signed)
[place], [place], [address] Phone: [phone]   Fax:  [phone]  Physical Therapy Treatment  Patient Details  Name: Samuel Gardner MRN: [id number] Date of Birth: 19-Feb-1971 Referring Provider (PT): Frankey Shown, MD   Encounter Date: 09/20/2018  PT End of Session - 09/20/18 1312    Visit Number  7    Number of Visits  12    Date for PT Re-Evaluation  10/11/18    Authorization Type  Medpay/Medpay assurance    PT Start Time  1301    PT Stop Time  1340    PT Time Calculation (min)  39 min    Activity Tolerance  Patient tolerated treatment well    Behavior During Therapy  [facility] for tasks assessed/performed       Past Medical History:  Diagnosis Date  . Diabetes mellitus without complication [facility])     Past Surgical History:  Procedure Laterality Date  . LEG SURGERY Right    teenager    There were no vitals filed for this visit.  Subjective Assessment - 09/20/18 1302    Subjective  Shoulder doing "pretty good" today. No major soreness noted after last tx. session on Monday.                       [facility] Adult PT Treatment/Exercise - 09/20/18 0001      Elbow Exercises   Elbow Flexion  Strengthening;Left   5 lbs. 3x10     Shoulder Exercises: Supine   Other Supine Exercises  supine rhythmic stabilization at 90 deg flexion 20 sec x 3    Other Supine Exercises  serratus punch 4 lbs. 2x10      Shoulder Exercises: Standing   External Rotation  Strengthening;Left   3x10   Theraband Level (Shoulder External Rotation)  Level 4 (Blue)    Internal Rotation  Strengthening;Both;20 reps    Theraband Level (Shoulder Internal Rotation)  Level 4 (Blue)    Internal Rotation Limitations  crossing bilat. IR    Flexion  Strengthening;Left;20 reps    Shoulder Flexion Weight (lbs)  3    ABduction  Strengthening;Left;20 reps    Shoulder ABduction Weight (lbs)  3    Row  Strengthening;Left   3x10    Row Limitations  cable row on Freemotion with 7 lbs.    Diagonals  Strengthening;Left;20 reps    Theraband Level (Shoulder Diagonals)  Level 2 (Red)    Other Standing Exercises  lift crate from 33" to 66" height, 20.5 lbs. total including empty crate weight, performed 2x10    Other Standing Exercises  lift crate floot to 33" shelf woith 90 deg turn 20.5 lbs. 2x10 (includes empty crate weight), cable press left unilat. 7 lbs. 3x10      Shoulder Exercises: ROM/Strengthening   UBE (Upper Arm Bike)  L2 x 5 min, 2.5 min ea. 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 today but still with intermittent soreness    Time  6    Period  Weeks    Status  On-going      PT LONG TERM GOAL #4   Title  Return to work full duty pending MD clearance    Baseline  unable to work    Time  6    Period  Weeks      PT LONG TERM GOAL #5   Title  Pt will improve FOTO score to </= 29% limitation.    Baseline  42% limitation on 09/06/2018    Time  6    Period  Weeks    Status  On-going             Plan - 09/20/18 1316    Clinical Impression Statement  Continued previous tx. emphasis on left UE strengthening to assist return to work. Some leg soreness noted with crate lifts from floor to 33 in. shelf likely consistent with delayed onset muscle soreness after session earlier this week. Continues to improve with shoulder strength/function but will need further/ongoing strengthening due to heavy lifting requirements for job. For ROM IR reach behind back but still with some posterior capsular tightness so included manual tx. to address.    Stability/Clinical Decision Making  Stable/Uncomplicated    Clinical Decision Making  Low    Rehab Potential  Good    PT Frequency  2x / week    PT Duration  6 weeks    PT Treatment/Interventions  ADLs/Self Care Home Management;Electrical Stimulation;Iontophoresis 4mg /ml Dexamethasone;Moist Heat;Ultrasound;Cryotherapy;Therapeutic activities;Therapeutic exercise;Neuromuscular re-education;Manual techniques;Dry needling;Passive range of motion;Taping    PT Next Visit Plan  Continut strengthening progression emphasis, monitor leg soreness with crate lifts    PT Home Exercise Plan  Theraband ER, IR, row, ext, [place] flexion, bicep curl, tricep ext, performing ROM also with wall walks/slides    Consulted and Agree with Plan of Care  Patient       Patient will benefit from skilled therapeutic intervention in order to improve the following deficits and impairments:  Pain, Impaired UE functional use, Decreased strength, Decreased range of motion  Visit Diagnosis: 1. Acute pain of left shoulder   2. Muscle weakness (generalized)        Problem List There are no active problems to display for this patient.   Beaulah Dinning, PT, DPT 09/20/18 1:43 PM  [facility] [address] [place], [place], [address] Phone: [phone]   Fax:  [phone]  Name: Samuel Gardner MRN:  [id number] Date of Birth: 08/09/1970

## 2018-09-21 ENCOUNTER — Telehealth: Payer: Self-pay | Admitting: Orthopaedic Surgery

## 2018-09-21 NOTE — Telephone Encounter (Signed)
Pt called in requesting a refill on his medication of Tramadol 50MG .  Please have that sent to [facility] on [address].  [phone]

## 2018-09-21 NOTE — Telephone Encounter (Signed)
Send refill requests to [facility] if you don't mind.  Thanks.

## 2018-09-21 NOTE — Telephone Encounter (Signed)
Please advise 

## 2018-09-21 NOTE — Telephone Encounter (Signed)
Can you advise please  

## 2018-09-22 ENCOUNTER — Other Ambulatory Visit: Payer: Self-pay | Admitting: Physician Assistant

## 2018-09-22 MED ORDER — TRAMADOL HCL 50 MG PO TABS: 50.0000 mg | ORAL_TABLET | Freq: Three times a day (TID) | ORAL | 0 refills | Status: DC | PRN

## 2018-09-22 NOTE — Telephone Encounter (Signed)
Sent in

## 2018-09-24 ENCOUNTER — Other Ambulatory Visit: Payer: Self-pay

## 2018-09-24 ENCOUNTER — Ambulatory Visit: Payer: No Typology Code available for payment source | Admitting: Physical Therapy

## 2018-09-24 ENCOUNTER — Encounter: Payer: Self-pay | Admitting: Physical Therapy

## 2018-09-24 DIAGNOSIS — M6281 Muscle weakness (generalized): Secondary | ICD-10-CM

## 2018-09-24 DIAGNOSIS — M25512 Pain in left shoulder: Secondary | ICD-10-CM | POA: Diagnosis not present

## 2018-09-24 NOTE — Therapy (Signed)
[place], [place], [address] Phone: [phone]   Fax:  [phone]  Physical Therapy Treatment  Patient Details  Name: Samuel Gardner MRN: [id number] Date of Birth: 1970-08-04 Referring Provider (PT): Frankey Shown, MD   Encounter Date: 09/24/2018  PT End of Session - 09/24/18 1347    Visit Number  8    Number of Visits  12    Date for PT Re-Evaluation  10/11/18    Authorization Type  Medpay/Medpay assurance    PT Start Time  6010    PT Stop Time  1355    PT Time Calculation (min)  38 min    Activity Tolerance  Patient tolerated treatment well    Behavior During Therapy  [facility] for tasks assessed/performed       Past Medical History:  Diagnosis Date  . Diabetes mellitus without complication [facility])     Past Surgical History:  Procedure Laterality Date  . LEG SURGERY Right    teenager    There were no vitals filed for this visit.  Subjective Assessment - 09/24/18 1316    Subjective  Pt. reports still having some stiffness in shoulder. Still improving but as previously shoulder/left UE weakness has been limiting factor. Leg soreness previously noted after lifting crates in therapy improved.    Diagnostic tests  X-rays    Patient Stated Goals  Improve shoulder strength and reaching ability, return to work and PLOF    Currently in Pain?  No/denies         [facility] PT Assessment - 09/24/18 0001      Strength   Left Shoulder Flexion  4+/5    Left Shoulder ABduction  4+/5    Left Shoulder Internal Rotation  5/5    Left Shoulder External Rotation  4+/5                   [facility] Adult PT Treatment/Exercise - 09/24/18 0001      Elbow Exercises   Elbow Flexion  Strengthening;Left   3x10 with 5 lbs.     Shoulder Exercises: Standing   External Rotation  Strengthening;Left   3x10   Theraband Level (Shoulder External Rotation)  Level 4 (Blue)    Internal Rotation Limitations  crossing  bilat. IR with blue band x 20    Flexion  Strengthening;Left;20 reps    Shoulder Flexion Weight (lbs)  3    ABduction  Strengthening;Left;20 reps    Shoulder ABduction Weight (lbs)  3    Row Limitations  see machines    Diagonals  Strengthening;Left;20 reps   D1   Diagonals Weight (lbs)  3    Other Standing Exercises  lift/move crate 20.5 lb. total including crate weight 2x10 from 33 in. to 66 in. shelves      Shoulder Exercises: ROM/Strengthening   UBE (Upper Arm Bike)  L2 x 5 min    Lat Pull Limitations  Freemotion left unilat. lat pulldown in staggered stande 10 lbs. 3x10    Cybex Press Limitations  machine chest press vertical handles 3x10 with 20 lbs.    Cybex Row Limitations  3x10 25 lb. vertical grips      Manual Therapy   Manual Therapy  Joint mobilization;Soft tissue mobilization;Passive ROM    Joint Mobilization  grade I-III AP GH mobilization    Soft tissue mobilization  trigger point STM left infraspinatus    Passive ROM  Left shoulder IR focus  PT Education - 09/24/18 1347    Education Details  exercises, OK massage at home but avoid excesive pressure to [facility] region    Person(s) Educated  Patient    Methods  Explanation    Comprehension  Verbalized understanding          PT Long Term Goals - 09/24/18 1357      PT LONG TERM GOAL #1   Title  Independent with HEP including advanced HEP for strengthening progression to assist return to work duties    Baseline  updates ongoing    Time  6    Period  Weeks    Status  On-going      PT LONG TERM GOAL #2   Title  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 today but still with intermittent soreness    Time  6    Period  Weeks    Status  On-going       PT LONG TERM GOAL #4   Title  Return to work full duty pending MD clearance    Baseline  unable to work, no light duty available    Time  6    Period  Weeks    Status  On-going      PT LONG TERM GOAL #5   Title  Pt will improve FOTO score to </= 29% limitation.    Baseline  42% limitation on 09/06/2018    Time  6    Period  Weeks    Status  On-going            Plan - 09/24/18 1359    Clinical Impression Statement  Pt. doing well with exercise/strengthening progression but still with weakness in left UE vs. contralteral UE. Stiffness most noted in IR with posterior capsulae tightness.    Personal Factors and Comorbidities  Comorbidity 1    Comorbidities  Diabetic    Examination-Activity Limitations  Bathing;Hygiene/Grooming;Lift;Sleep;Dressing;Reach Overhead    Stability/Clinical Decision Making  Stable/Uncomplicated    Clinical Decision Making  Low    Rehab Potential  Good    PT Frequency  2x / week    PT Duration  6 weeks    PT Treatment/Interventions  ADLs/Self Care Home Management;Electrical Stimulation;Iontophoresis 4mg /ml Dexamethasone;Moist Heat;Ultrasound;Cryotherapy;Therapeutic activities;Therapeutic exercise;Neuromuscular re-education;Manual techniques;Dry needling;Passive range of motion;Taping    PT Next Visit Plan  Check FOTO, continue strengthening progression emphasis as tolerated, manual tx. as needed for stiffness    PT Home Exercise Plan  Theraband ER, IR, row, ext, [place] flexion, bicep curl, tricep ext, performing ROM also with wall walks/slides    Consulted and Agree with Plan of Care  Patient       Patient will benefit from skilled therapeutic intervention in order to improve the following deficits and impairments:  Pain, Impaired UE functional use, Decreased strength, Decreased range of motion  Visit Diagnosis: 1. Acute pain of left shoulder   2. Muscle weakness (generalized)        Problem List There are no active problems to display for  this patient.   Beaulah Dinning, PT, DPT 09/24/18 2:02 PM  [facility]  [place], [place], [address] Phone: [phone]   Fax:  [phone]  Name: Samuel Gardner MRN: [id number] Date of Birth: 1971-02-11

## 2018-09-26 ENCOUNTER — Encounter: Payer: Self-pay | Admitting: Physical Therapy

## 2018-09-26 ENCOUNTER — Ambulatory Visit: Payer: No Typology Code available for payment source | Admitting: Physical Therapy

## 2018-09-26 ENCOUNTER — Other Ambulatory Visit: Payer: Self-pay

## 2018-09-26 DIAGNOSIS — M25512 Pain in left shoulder: Secondary | ICD-10-CM | POA: Diagnosis not present

## 2018-09-26 DIAGNOSIS — M6281 Muscle weakness (generalized): Secondary | ICD-10-CM

## 2018-09-26 NOTE — Therapy (Signed)
[place], [place], [address] Phone: [phone]   Fax:  [phone]  Physical Therapy Treatment  Patient Details  Name: Samuel Gardner MRN: [id number] Date of Birth: 1970/10/10 Referring Provider (PT): Frankey Shown, MD   Encounter Date: 09/26/2018  PT End of Session - 09/26/18 1454    Visit Number  9    Number of Visits  12    Date for PT Re-Evaluation  10/11/18    Authorization Type  Medpay/Medpay assurance    PT Start Time  1452    PT Stop Time  1530    PT Time Calculation (min)  38 min    Activity Tolerance  Patient tolerated treatment well    Behavior During Therapy  [facility] for tasks assessed/performed       Past Medical History:  Diagnosis Date  . Diabetes mellitus without complication [facility])     Past Surgical History:  Procedure Laterality Date  . LEG SURGERY Right    teenager    There were no vitals filed for this visit.  Subjective Assessment - 09/26/18 1453    Subjective  Shoulder feeling looser today. No pain pre-tx.    Currently in Pain?  No/denies         [facility] PT Assessment - 09/26/18 0001      Observation/Other Assessments   Focus on Therapeutic Outcomes (FOTO)   8% limited                   [facility] Adult PT Treatment/Exercise - 09/26/18 0001      Exercises   Exercises  Elbow      Elbow Exercises   Elbow Flexion  Strengthening;Left   3x10 freemotion cable with 10 lbs. resistance    Elbow Flexion Limitations  unilat. for 1st 2 sets, bilat. for last set    Elbow Extension  Strengthening;Left   3x8 with freemotion cable with 13 lbs. resistance     Shoulder Exercises: Supine   Other Supine Exercises  supine rhythmic stabilization at 90 deg flexion 30 sec x 3    Other Supine Exercises  serratus punch 4 lbs. 2x10      Shoulder Exercises: Standing   External Rotation  Strengthening;Left;20 reps   2x10 with freemotion cable 3 lbs.   Internal Rotation   Strengthening;Left;20 reps   freemotion cable 2x10 with 7 lbs.   Flexion  Strengthening;Left;20 reps    Shoulder Flexion Weight (lbs)  3    ABduction  Strengthening;Left;20 reps    Shoulder ABduction Weight (lbs)  3    Other Standing Exercises  lift/move crate 20.5 lb. total including crate weight 2x10 from 33 in. to 66 in. shelves      Shoulder Exercises: ROM/Strengthening   UBE (Upper Arm Bike)  L3 x 5 min with 2.5 fw/2.5 rev    Lat Pull Limitations  Freemotion left unilat. lat pulldown in staggered stande 17 lbs. 3x10    Cybex Press Limitations  machine chest press vertical handles 3x10 with 25 lbs.    Cybex Row Limitations  3x10 with 45 lbs.      Manual Therapy   Joint Mobilization  grade I-III AP GH mobilization    Soft tissue mobilization  trigger point STM left infraspinatus    Passive ROM  Left shoulder IR focus             PT Education - 09/26/18 1533    Education Details  POC, theracane use to help with muscle  tightness    Person(s) Educated  Patient    Methods  Explanation    Comprehension  Verbalized understanding          PT Long Term Goals - 09/26/18 1535      PT LONG TERM GOAL #1   Title  Independent with HEP including advanced HEP for strengthening progression to assist return to work duties    Baseline  updates ongoing    Time  6    Period  Weeks    Status  On-going      PT LONG TERM GOAL #2   Title  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 today but still with intermittent soreness    Time  6    Period  Weeks    Status  On-going      PT LONG TERM GOAL #4   Title  Return to work full duty pending MD  clearance    Baseline  unable to work, no light duty available    Time  6    Period  Weeks    Status  On-going      PT LONG TERM GOAL #5   Title  Pt will improve FOTO score to </= 29% limitation.    Baseline  8%    Time  6    Period  Weeks    Status  Achieved            Plan - 09/26/18 1534    Clinical Impression Statement  Pt. continues to improve with shoulder strengthening from previous status. Functional status improving as noted by FOTO score improvements. In discussing status with patient tentative plan is to finish out PT plan of care and follow up with Dr. Erlinda Hong regarding work status pending continued progress with therapy.    Stability/Clinical Decision Making  Stable/Uncomplicated    Clinical Decision Making  Low    PT Frequency  2x / week    PT Duration  6 weeks    PT Treatment/Interventions  ADLs/Self Care Home Management;Electrical Stimulation;Iontophoresis 4mg /ml Dexamethasone;Moist Heat;Ultrasound;Cryotherapy;Therapeutic activities;Therapeutic exercise;Neuromuscular re-education;Manual techniques;Dry needling;Passive range of motion;Taping    PT Next Visit Plan  continue strengthening progression emphasis as tolerated, manual tx. as needed for stiffness    PT Home Exercise Plan  Theraband ER, IR, row, ext, [place] flexion, bicep curl, tricep ext, performing ROM also with wall walks/slides    Consulted and Agree with Plan of Care  Patient       Patient will benefit from skilled therapeutic intervention in order to improve the following deficits and impairments:  Pain, Impaired UE functional use, Decreased strength, Decreased range of motion  Visit Diagnosis: 1. Acute pain of left shoulder   2. Muscle weakness (generalized)        Problem List There are no active problems to display for this patient.   Beaulah Dinning, PT, DPT 09/26/18 3:37 PM  [facility] [address] [place], [place], [address] Phone:  [phone]   Fax:  [phone]  Name: Samuel Gardner  MRN: [id number] Date of Birth: [birth year]

## 2018-10-03 ENCOUNTER — Other Ambulatory Visit: Payer: Self-pay

## 2018-10-03 ENCOUNTER — Encounter: Payer: Self-pay | Admitting: Orthopaedic Surgery

## 2018-10-03 ENCOUNTER — Ambulatory Visit (INDEPENDENT_AMBULATORY_CARE_PROVIDER_SITE_OTHER): Payer: Self-pay | Admitting: Orthopaedic Surgery

## 2018-10-03 DIAGNOSIS — S43102D Unspecified dislocation of left acromioclavicular joint, subsequent encounter: Secondary | ICD-10-CM

## 2018-10-03 NOTE — Progress Notes (Signed)
   Office Visit Note   Patient: Samuel Gardner           Date of Birth: 12-09-1970           MRN: [id number] Visit Date: 10/03/2018              Requested by: No referring provider defined for this encounter. PCP: Patient, No Pcp Per   Assessment & Plan: Visit Diagnoses:  1. AC separation, type 2, left, subsequent encounter     Plan: Impression is healed type II left AC separation.  The patient will advance with activity as tolerated.  We will allow him to return to work without restrictions this coming Monday, 10/08/2018.  Work note provided.  He will follow-up with [place] as needed.  Follow-Up Instructions: Return if symptoms worsen or fail to improve.   Orders:  No orders of the defined types were placed in this encounter.  No orders of the defined types were placed in this encounter.     Procedures: No procedures performed   Clinical Data: No additional findings.   Subjective: Chief Complaint  Patient presents with  . Left Shoulder - Follow-up    HPI patient is a pleasant 48 year old gentleman who presents our clinic today nearly 12 weeks status post left type 2 AC joint separation.  He has recently finished formal physical therapy.  He has regained full strength and range of motion.  He denies any pain.  He is ready to return to work full duty.  Review of Systems as detailed in HPI.  All others reviewed and are negative.   Objective: Vital Signs: There were no vitals taken for this visit.  Physical Exam well-developed and well-nourished gentleman in no acute distress.  Alert and oriented x3.  Ortho Exam examination of the left shoulder reveals a palpable deformity.  This is painless.  He has full active range of motion in all planes.  He has full strength throughout.  He is neurovascularly intact distally.  Specialty Comments:  No specialty comments available.  Imaging: No new imaging   PMFS History: There are no active problems to display for this patient.   Past Medical History:  Diagnosis Date  . Diabetes mellitus without complication ([place])     History reviewed. No pertinent family history.  Past Surgical History:  Procedure Laterality Date  . LEG SURGERY Right    teenager   Social History   Occupational History  . Not on file  Tobacco Use  . Smoking status: Current Every Day Smoker    Packs/day: 1.00    Types: Cigarettes  . Smokeless tobacco: Never Used  Substance and Sexual Activity  . Alcohol use: Yes  . Drug use: No  . Sexual activity: Not on file

## 2018-10-05 ENCOUNTER — Telehealth: Payer: Self-pay | Admitting: Orthopaedic Surgery

## 2018-10-05 NOTE — Telephone Encounter (Signed)
Please advise 

## 2018-10-05 NOTE — Telephone Encounter (Signed)
FYI I called patient and advised. He states that he was just trying to prepare himself in the instance he needed anything when he resumed work. I advised he would need to follow up if problems after work resumption.

## 2018-10-05 NOTE — Telephone Encounter (Signed)
He has healed. No need to refill any longer

## 2018-10-05 NOTE — Telephone Encounter (Signed)
Patient called requesting refill Tramadol.  Please call patient @ [phone]

## 2018-10-05 NOTE — Telephone Encounter (Signed)
Ok, thanks.

## 2018-10-05 NOTE — Telephone Encounter (Signed)
noted 

## 2018-11-07 NOTE — Therapy (Signed)
[place], [place], [address] Phone: [phone]   Fax:  [phone]  Physical Therapy Treatment/Discharge  Patient Details  Name: Samuel Gardner MRN: [id number] Date of Birth: 08-08-1970 Referring Provider (PT): Frankey Shown, MD   Encounter Date: 09/26/2018    Past Medical History:  Diagnosis Date  . Diabetes mellitus without complication [facility])     Past Surgical History:  Procedure Laterality Date  . LEG SURGERY Right    teenager    There were no vitals filed for this visit.                                 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 today but still with intermittent soreness    Time  6    Period  Weeks    Status  On-going      PT LONG TERM GOAL #4   Title  Return to work full duty pending MD clearance    Baseline  unable to work, no light duty available    Time  6    Period  Weeks    Status  On-going      PT LONG TERM GOAL #5   Title  Pt will improve FOTO score to </= 29% limitation.    Baseline  8%    Time  6    Period  Weeks    Status  Achieved              Patient will benefit from skilled therapeutic intervention in order to improve the following deficits and impairments:  Pain, Impaired UE functional use,  Decreased strength, Decreased range of motion  Visit Diagnosis: Acute pain of left shoulder  Muscle weakness (generalized)     Problem List There are no active problems to display for this patient.     PHYSICAL THERAPY DISCHARGE SUMMARY  Visits from Start of Care: 9  Current functional level related to goals / functional outcomes: Patient did not return for further therapy after last visit 09/26/18-he has returned to work without restrictions and no further formal therapy planned at this time.   Remaining deficits: NA   Education / Equipment: NA Plan: Patient agrees to discharge.  Patient goals were partially met. Patient is being discharged due to being pleased with the current functional level.  ?????           Beaulah Dinning, PT, DPT 11/07/18 1:22 PM    [facility] [place],  [place], [address] Phone: [phone]   Fax:  [phone]  Name: Samuel Gardner MRN: [id number] Date of Birth: 12/07/1970
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
## 2022-01-29 ENCOUNTER — Ambulatory Visit (HOSPITAL_COMMUNITY)
Admission: EM | Admit: 2022-01-29 | Discharge: 2022-01-29 | Disposition: A | Discharge status: Home / Self Care | Payer: Self-pay | Attending: Family Medicine | Admitting: Family Medicine

## 2022-01-29 ENCOUNTER — Encounter (HOSPITAL_COMMUNITY): Payer: Self-pay

## 2022-01-29 ENCOUNTER — Other Ambulatory Visit: Payer: Self-pay

## 2022-01-29 ENCOUNTER — Inpatient Hospital Stay (HOSPITAL_COMMUNITY)
Admission: EM | Admit: 2022-01-29 | Discharge: 2022-02-02 | DRG: 291 | Disposition: A | Discharge status: Home / Self Care | Payer: Self-pay | Attending: Internal Medicine | Admitting: Internal Medicine

## 2022-01-29 ENCOUNTER — Emergency Department (HOSPITAL_COMMUNITY): Payer: Self-pay

## 2022-01-29 DIAGNOSIS — R0600 Dyspnea, unspecified: Secondary | ICD-10-CM

## 2022-01-29 DIAGNOSIS — I160 Hypertensive urgency: Secondary | ICD-10-CM | POA: Diagnosis present

## 2022-01-29 DIAGNOSIS — E1122 Type 2 diabetes mellitus with diabetic chronic kidney disease: Secondary | ICD-10-CM | POA: Diagnosis present

## 2022-01-29 DIAGNOSIS — J441 Chronic obstructive pulmonary disease with (acute) exacerbation: Secondary | ICD-10-CM | POA: Diagnosis present

## 2022-01-29 DIAGNOSIS — R7989 Other specified abnormal findings of blood chemistry: Secondary | ICD-10-CM

## 2022-01-29 DIAGNOSIS — I10 Essential (primary) hypertension: Secondary | ICD-10-CM

## 2022-01-29 DIAGNOSIS — I509 Heart failure, unspecified: Secondary | ICD-10-CM

## 2022-01-29 DIAGNOSIS — N183 Chronic kidney disease, stage 3 unspecified: Secondary | ICD-10-CM | POA: Diagnosis present

## 2022-01-29 DIAGNOSIS — N179 Acute kidney failure, unspecified: Secondary | ICD-10-CM | POA: Diagnosis present

## 2022-01-29 DIAGNOSIS — N189 Chronic kidney disease, unspecified: Secondary | ICD-10-CM

## 2022-01-29 DIAGNOSIS — Z79899 Other long term (current) drug therapy: Secondary | ICD-10-CM

## 2022-01-29 DIAGNOSIS — I130 Hypertensive heart and chronic kidney disease with heart failure and stage 1 through stage 4 chronic kidney disease, or unspecified chronic kidney disease: Principal | ICD-10-CM | POA: Diagnosis present

## 2022-01-29 DIAGNOSIS — F17210 Nicotine dependence, cigarettes, uncomplicated: Secondary | ICD-10-CM | POA: Diagnosis present

## 2022-01-29 DIAGNOSIS — Z91199 Patient's noncompliance with other medical treatment and regimen due to unspecified reason: Secondary | ICD-10-CM

## 2022-01-29 DIAGNOSIS — Z833 Family history of diabetes mellitus: Secondary | ICD-10-CM

## 2022-01-29 DIAGNOSIS — D631 Anemia in chronic kidney disease: Secondary | ICD-10-CM | POA: Diagnosis present

## 2022-01-29 DIAGNOSIS — I5033 Acute on chronic diastolic (congestive) heart failure: Secondary | ICD-10-CM | POA: Diagnosis present

## 2022-01-29 DIAGNOSIS — Z91148 Patient's other noncompliance with medication regimen for other reason: Secondary | ICD-10-CM

## 2022-01-29 DIAGNOSIS — I2489 Other forms of acute ischemic heart disease: Secondary | ICD-10-CM | POA: Diagnosis present

## 2022-01-29 DIAGNOSIS — E1165 Type 2 diabetes mellitus with hyperglycemia: Secondary | ICD-10-CM | POA: Diagnosis present

## 2022-01-29 DIAGNOSIS — R609 Edema, unspecified: Secondary | ICD-10-CM

## 2022-01-29 DIAGNOSIS — I5031 Acute diastolic (congestive) heart failure: Secondary | ICD-10-CM

## 2022-01-29 DIAGNOSIS — E785 Hyperlipidemia, unspecified: Secondary | ICD-10-CM | POA: Diagnosis present

## 2022-01-29 HISTORY — DX: Essential (primary) hypertension: I10

## 2022-01-29 LAB — CBC
HCT: 37.1 % — ABNORMAL LOW (ref 39.0–52.0)
Hemoglobin: 12.4 g/dL — ABNORMAL LOW (ref 13.0–17.0)
MCH: 30 pg (ref 26.0–34.0)
MCHC: 33.4 g/dL (ref 30.0–36.0)
MCV: 89.8 fL (ref 80.0–100.0)
Platelets: 233 10*3/uL (ref 150–400)
RBC: 4.13 MIL/uL — ABNORMAL LOW (ref 4.22–5.81)
RDW: 12.9 % (ref 11.5–15.5)
WBC: 9.6 10*3/uL (ref 4.0–10.5)
nRBC: 0 % (ref 0.0–0.2)

## 2022-01-29 LAB — BASIC METABOLIC PANEL
Anion gap: 8 (ref 5–15)
BUN: 24 mg/dL — ABNORMAL HIGH (ref 6–20)
CO2: 22 mmol/L (ref 22–32)
Calcium: 8.5 mg/dL — ABNORMAL LOW (ref 8.9–10.3)
Chloride: 107 mmol/L (ref 98–111)
Creatinine, Ser: 2.5 mg/dL — ABNORMAL HIGH (ref 0.61–1.24)
GFR, Estimated: 30 mL/min — ABNORMAL LOW (ref >60)
Glucose, Bld: 111 mg/dL — ABNORMAL HIGH (ref 70–99)
Potassium: 4.7 mmol/L (ref 3.5–5.1)
Sodium: 137 mmol/L (ref 135–145)

## 2022-01-29 NOTE — ED Triage Notes (Signed)
Patient here with complaint of intermittent shortness of breath that started three days ago. Currently speaking in complete sentences, patient is alert, oriented, and in no apparent distress at this time.

## 2022-01-29 NOTE — ED Notes (Signed)
Patient is being discharged from the [facility]gent [facility] and sent to the Emergency Department via private vehicle with significant other. Per Dr Mannie Stabile, patient is stable but in need of higher level of care due to HTN, dyspnea, BLE edema. Patient is aware and verbalizes understanding of plan of care.  Vitals:   01/29/22 1636 01/29/22 1640  BP: (!) 232/104 (!) 240/108  Pulse: 86 93  Resp: 18 18  Temp: 98.4 F (36.9 C)   SpO2: 99% 98%

## 2022-01-29 NOTE — ED Triage Notes (Signed)
C/O dyspnea and BLE swelling x 3 days.  Denies any pain. Skin warm and dry.

## 2022-01-30 ENCOUNTER — Encounter (HOSPITAL_COMMUNITY): Payer: Self-pay | Admitting: Family Medicine

## 2022-01-30 ENCOUNTER — Inpatient Hospital Stay (HOSPITAL_COMMUNITY): Payer: Self-pay

## 2022-01-30 ENCOUNTER — Other Ambulatory Visit: Payer: Self-pay

## 2022-01-30 DIAGNOSIS — I160 Hypertensive urgency: Secondary | ICD-10-CM | POA: Diagnosis present

## 2022-01-30 DIAGNOSIS — N183 Chronic kidney disease, stage 3 unspecified: Secondary | ICD-10-CM | POA: Insufficient documentation

## 2022-01-30 DIAGNOSIS — I10 Essential (primary) hypertension: Secondary | ICD-10-CM

## 2022-01-30 DIAGNOSIS — N19 Unspecified kidney failure: Secondary | ICD-10-CM

## 2022-01-30 DIAGNOSIS — R0789 Other chest pain: Secondary | ICD-10-CM

## 2022-01-30 DIAGNOSIS — N189 Chronic kidney disease, unspecified: Secondary | ICD-10-CM

## 2022-01-30 DIAGNOSIS — I5031 Acute diastolic (congestive) heart failure: Secondary | ICD-10-CM

## 2022-01-30 DIAGNOSIS — N179 Acute kidney failure, unspecified: Secondary | ICD-10-CM

## 2022-01-30 DIAGNOSIS — I509 Heart failure, unspecified: Secondary | ICD-10-CM

## 2022-01-30 DIAGNOSIS — R7989 Other specified abnormal findings of blood chemistry: Secondary | ICD-10-CM

## 2022-01-30 LAB — LIPID PANEL
Cholesterol: 208 mg/dL — ABNORMAL HIGH (ref 0–200)
HDL: 55 mg/dL (ref >40)
LDL Cholesterol: 132 mg/dL — ABNORMAL HIGH (ref 0–99)
Total CHOL/HDL Ratio: 3.8 RATIO
Triglycerides: 106 mg/dL (ref <150)
VLDL: 21 mg/dL (ref 0–40)

## 2022-01-30 LAB — CBC WITH DIFFERENTIAL/PLATELET
Abs Immature Granulocytes: 0.04 10*3/uL (ref 0.00–0.07)
Basophils Absolute: 0.1 10*3/uL (ref 0.0–0.1)
Basophils Relative: 1 %
Eosinophils Absolute: 0.1 10*3/uL (ref 0.0–0.5)
Eosinophils Relative: 2 %
HCT: 34.4 % — ABNORMAL LOW (ref 39.0–52.0)
Hemoglobin: 11.2 g/dL — ABNORMAL LOW (ref 13.0–17.0)
Immature Granulocytes: 0 %
Lymphocytes Relative: 14 %
Lymphs Abs: 1.3 10*3/uL (ref 0.7–4.0)
MCH: 29.6 pg (ref 26.0–34.0)
MCHC: 32.6 g/dL (ref 30.0–36.0)
MCV: 91 fL (ref 80.0–100.0)
Monocytes Absolute: 1 10*3/uL (ref 0.1–1.0)
Monocytes Relative: 11 %
Neutro Abs: 6.9 10*3/uL (ref 1.7–7.7)
Neutrophils Relative %: 72 %
Platelets: 217 10*3/uL (ref 150–400)
RBC: 3.78 MIL/uL — ABNORMAL LOW (ref 4.22–5.81)
RDW: 13.1 % (ref 11.5–15.5)
WBC: 9.4 10*3/uL (ref 4.0–10.5)
nRBC: 0 % (ref 0.0–0.2)

## 2022-01-30 LAB — CREATININE, SERUM
Creatinine, Ser: 2.62 mg/dL — ABNORMAL HIGH (ref 0.61–1.24)
GFR, Estimated: 29 mL/min — ABNORMAL LOW (ref >60)

## 2022-01-30 LAB — RAPID URINE DRUG SCREEN, HOSP PERFORMED
Amphetamines: NOT DETECTED
Barbiturates: NOT DETECTED
Benzodiazepines: NOT DETECTED
Cocaine: POSITIVE — AB
Opiates: NOT DETECTED
Tetrahydrocannabinol: NOT DETECTED

## 2022-01-30 LAB — CBG MONITORING, ED
Glucose-Capillary: 80 mg/dL (ref 70–99)
Glucose-Capillary: 99 mg/dL (ref 70–99)
Glucose-Capillary: 82 mg/dL (ref 70–99)

## 2022-01-30 LAB — TROPONIN I (HIGH SENSITIVITY)
Troponin I (High Sensitivity): 81 ng/L — ABNORMAL HIGH (ref <18)
Troponin I (High Sensitivity): 90 ng/L — ABNORMAL HIGH (ref <18)

## 2022-01-30 LAB — ECHOCARDIOGRAM COMPLETE
Area-P 1/2: 4.06 cm2
S' Lateral: 3.4 cm

## 2022-01-30 LAB — HIV ANTIBODY (ROUTINE TESTING W REFLEX): HIV Screen 4th Generation wRfx: NONREACTIVE

## 2022-01-30 LAB — BRAIN NATRIURETIC PEPTIDE: B Natriuretic Peptide: 758.7 pg/mL — ABNORMAL HIGH (ref 0.0–100.0)

## 2022-01-30 LAB — MAGNESIUM: Magnesium: 1.8 mg/dL (ref 1.7–2.4)

## 2022-01-30 LAB — GLUCOSE, CAPILLARY: Glucose-Capillary: 111 mg/dL — ABNORMAL HIGH (ref 70–99)

## 2022-01-30 LAB — PHOSPHORUS: Phosphorus: 3.7 mg/dL (ref 2.5–4.6)

## 2022-01-30 MED ORDER — ALBUTEROL SULFATE (2.5 MG/3ML) 0.083% IN NEBU: 2.5000 mg | INHALATION_SOLUTION | RESPIRATORY_TRACT | Status: DC | PRN

## 2022-01-30 MED ORDER — HYDRALAZINE HCL 20 MG/ML IJ SOLN
10.0000 mg | INTRAMUSCULAR | Status: DC | PRN
  Administered 2022-01-30 – 2022-01-31 (×4): 10 mg via INTRAVENOUS
  Filled 2022-01-30 (×4): qty 1

## 2022-01-30 MED ORDER — IPRATROPIUM-ALBUTEROL 0.5-2.5 (3) MG/3ML IN SOLN
3.0000 mL | Freq: Four times a day (QID) | RESPIRATORY_TRACT | Status: DC
  Administered 2022-01-30 – 2022-02-01 (×6): 3 mL via RESPIRATORY_TRACT
  Filled 2022-01-30 (×8): qty 3

## 2022-01-30 MED ORDER — SODIUM CHLORIDE 0.9% FLUSH
3.0000 mL | Freq: Two times a day (BID) | INTRAVENOUS | Status: DC
  Administered 2022-01-30 – 2022-02-01 (×5): 3 mL via INTRAVENOUS

## 2022-01-30 MED ORDER — SODIUM CHLORIDE 0.9 % IV SOLN: 250.0000 mL | INTRAVENOUS | Status: DC | PRN

## 2022-01-30 MED ORDER — ONDANSETRON HCL 4 MG/2ML IJ SOLN: 4.0000 mg | Freq: Four times a day (QID) | INTRAMUSCULAR | Status: DC | PRN

## 2022-01-30 MED ORDER — INSULIN ASPART 100 UNIT/ML IJ SOLN: 0.0000 [IU] | Freq: Every day | INTRAMUSCULAR | Status: DC

## 2022-01-30 MED ORDER — FUROSEMIDE 10 MG/ML IJ SOLN
40.0000 mg | INTRAMUSCULAR | Status: AC
  Administered 2022-01-30: 40 mg via INTRAVENOUS
  Filled 2022-01-30: qty 4

## 2022-01-30 MED ORDER — NICOTINE 14 MG/24HR TD PT24
14.0000 mg | MEDICATED_PATCH | Freq: Every day | TRANSDERMAL | Status: DC
  Filled 2022-01-30 (×4): qty 1

## 2022-01-30 MED ORDER — FUROSEMIDE 10 MG/ML IJ SOLN
40.0000 mg | Freq: Two times a day (BID) | INTRAMUSCULAR | Status: DC
  Administered 2022-01-30 (×2): 40 mg via INTRAVENOUS
  Filled 2022-01-30 (×2): qty 4

## 2022-01-30 MED ORDER — AMLODIPINE BESYLATE 10 MG PO TABS
10.0000 mg | ORAL_TABLET | Freq: Every day | ORAL | Status: DC
  Administered 2022-01-30 – 2022-02-02 (×4): 10 mg via ORAL
  Filled 2022-01-30 (×2): qty 1
  Filled 2022-01-30: qty 2
  Filled 2022-01-30: qty 1

## 2022-01-30 MED ORDER — HEPARIN SODIUM (PORCINE) 5000 UNIT/ML IJ SOLN
5000.0000 [IU] | Freq: Three times a day (TID) | INTRAMUSCULAR | Status: DC
  Administered 2022-01-30 – 2022-02-01 (×8): 5000 [IU] via SUBCUTANEOUS
  Filled 2022-01-30 (×8): qty 1

## 2022-01-30 MED ORDER — SODIUM CHLORIDE 0.9% FLUSH: 3.0000 mL | INTRAVENOUS | Status: DC | PRN

## 2022-01-30 MED ORDER — INSULIN ASPART 100 UNIT/ML IJ SOLN
0.0000 [IU] | Freq: Three times a day (TID) | INTRAMUSCULAR | Status: DC
  Administered 2022-01-31: 2 [IU] via SUBCUTANEOUS
  Administered 2022-01-31: 3 [IU] via SUBCUTANEOUS
  Administered 2022-02-01 – 2022-02-02 (×3): 2 [IU] via SUBCUTANEOUS

## 2022-01-30 MED ORDER — CARVEDILOL 6.25 MG PO TABS
6.2500 mg | ORAL_TABLET | Freq: Two times a day (BID) | ORAL | Status: DC
  Administered 2022-01-30 – 2022-02-01 (×5): 6.25 mg via ORAL
  Filled 2022-01-30 (×2): qty 2
  Filled 2022-01-30 (×3): qty 1
  Filled 2022-01-30: qty 2

## 2022-01-30 MED ORDER — ACETAMINOPHEN 325 MG PO TABS: 650.0000 mg | ORAL_TABLET | ORAL | Status: DC | PRN

## 2022-01-30 NOTE — Progress Notes (Signed)
Patient admitted early this morning for hypertensive urgency and CHF exacerbation and has been started on IV Lasix.  Cardiology has been consulted.  I have reviewed this patient's medical records including this morning's H&P, current vitals, labs and medications myself.  Continue IV Lasix.  Follow cardiology recommendations.  Strict input and output.  Daily weights.  Follow 2D echo.

## 2022-01-30 NOTE — Progress Notes (Incomplete)
{  Select Note:[id number]} 

## 2022-01-30 NOTE — Consult Note (Signed)
Cardiology Consultation   Patient ID: Samuel Gardner MRN: [id number]; DOB: 11-19-1970  Admit date: 01/29/2022 Date of Consult: 01/30/2022  PCP:  Patient, No Pcp Per   [facility] Prov[facility] Cardiologist:  New to [facility] - Dr. Radford Pax  Patient Profile:   Samuel Gardner is a 51 y.o. male with a hx of HTN, Type 2 DM, tobacco use and medication noncompliance who is being seen 01/30/2022 for the evaluation of CHF at the request of Dr. Claria Dice.  History of Present Illness:   [name] presented to [facility] ED on 01/29/2022 for evaluation of worsening dyspnea and lower extremity edema for the past 3 days. 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 Denies any exertional chest pain or dyspnea on exertion with these activities. Over the past 3 days, he started to experience dyspnea on exertion along with orthopnea, PND and lower extremity edema. Says he has been out of work for the past several days and was consuming more fast food. Reports having a nonproductive cough as well and has experienced some chest discomfort with coughing but there is no association with exertion.  Says he was told he had elevated BP in the past but was on medical therapy for this. He is also aware of being told he had diabetes in the past but has not been on medications. He reports a strong family history of hypertension and kidney disease. No specific family history of cardiac issues.  BP was initially elevated at 234/102 while in the ED. Initial labs showed WBC 9.6, Hgb 12.4, platelets 233, Na+ 137, K+ 4.7 and creatinine 2.50 (no recent values available for comparison but was previously at 1.01 in 2018). BNP 758. Initial and repeat Hs Troponin values at 90 and 81. Hgb A1c pending. FLP shows total cholesterol 208, triglycerides 106, HDL 55 and LDL 132. CXR with no  active cardiopulmonary disease. EKG shows normal sinus rhythm, heart rate 84 with LVH and slight ST depression along the inferior leads which is possibly secondary to repolarization abnormalities. No prior tracings available for comparison.  He received 40 mg of IV Lasix while in the ED and has been started on 40 mg twice daily. He was also started on Amlodipine 10 mg daily and Coreg 6.25 mg twice daily for BP control. BP was at 157/94 on most recent check. While I's and O's has not been recorded, he reports filling his urinal at least 3 times since this morning.   Past Medical History:  Diagnosis Date   Diabetes mellitus without complication ([place])    Hypertension     Past Surgical History:  Procedure Laterality Date   LEG SURGERY Right    teenager     Home Medications:  Prior to Admission medications   Medication Sig Start Date End Date Taking? Authorizing Provider  cyclobenzaprine (FLEXERIL) 10 MG tablet Take 1 tablet (10 mg total) by mouth 2 (two) times daily as needed for muscle spasms. Patient not taking: Reported on 07/24/2016 11/09/14   Lysbeth Penner, FNP  HYDROcodone-acetaminophen (NORCO) 5-325 MG tablet Take 1 tablet by mouth 2 (two) times daily as needed for moderate pain. Patient not taking: Reported on 01/30/2022 07/30/18   Aundra Dubin, [name]  ibuprofen (ADVIL,MOTRIN) 600 MG tablet Take 1 tablet (600 mg total) by mouth every 6 (six) hours as needed for moderate  pain. Patient not taking: Reported on 07/13/2018 07/24/16   Forde Dandy, MD  naproxen (NAPROSYN) 375 MG tablet Take 1 tablet (375 mg total) by mouth 2 (two) times daily. Patient not taking: Reported on 07/24/2016 11/09/14   Lysbeth Penner, FNP  traMADol (ULTRAM) 50 MG tablet Take 1 tablet (50 mg total) by mouth 3 (three) times daily as needed. Patient not taking: Reported on 01/30/2022 09/22/18   Aundra Dubin, [name]    Inpatient Medications: Scheduled Meds:  amLODipine  10 mg Oral Daily   carvedilol  6.25  mg Oral BID WC   furosemide  40 mg Intravenous BID   heparin  5,000 Units Subcutaneous Q8H   insulin aspart  0-15 Units Subcutaneous TID WC   insulin aspart  0-5 Units Subcutaneous QHS   nicotine  14 mg Transdermal Daily   sodium chloride flush  3 mL Intravenous Q12H   Continuous Infusions:  sodium chloride     PRN Meds: sodium chloride, acetaminophen, hydrALAZINE, ondansetron (ZOFRAN) IV, sodium chloride flush  Allergies:   No Known Allergies  Social History:   Social History   Socioeconomic History   Marital status: Single    Spouse name: Not on file   Number of children: Not on file   Years of education: Not on file   Highest education level: Not on file  Occupational History   Not on file  Tobacco Use   Smoking status: Every Day    Packs/day: 1.00    Types: Cigarettes   Smokeless tobacco: Never  Substance and Sexual Activity   Alcohol use: Yes   Drug use: No   Sexual activity: Not on file  Other Topics Concern   Not on file  Social History Narrative   Not on file   Social Determinants of Health   Financial Resource Strain: Not on file  Food Insecurity: Not on file  Transportation Needs: Not on file  Physical Activity: Not on file  Stress: Not on file  Social Connections: Not on file  Intimate Partner Violence: Not on file    Family History:    Family History  Problem Relation Age of Onset   Diabetes Mother    Hypertension Mother    Kidney disease Mother    Diabetes Sister    Hypertension Sister      ROS:  Please see the history of present illness.   All other ROS reviewed and negative.     Physical Exam/Data:   Vitals:   01/30/22 0630 01/30/22 0645 01/30/22 0700 01/30/22 0715  BP: (!) 149/84 (!) 150/82 (!) 150/83 (!) 157/94  Pulse: 70 81 71 74  Resp: 15 (!) 21 18 20   Temp:      TempSrc:      SpO2: 97% 96% 98% 98%   No intake or output data in the 24 hours ending 01/30/22 0943    07/17/2018    8:54 AM 07/13/2018    3:20 PM  Last 3  Weights  Weight (lbs) 165 lb 165 lb  Weight (kg) 74.844 kg 74.844 kg     There is no height or weight on file to calculate BMI.  General: Pleasant male appearing in no acute distress.  HEENT: normal Neck: JVD at 9-10 cm.  Vascular: No carotid bruits; Distal pulses 2+ bilaterally Cardiac:  normal S1, S2; RRR;  Lungs:  clear to auscultation bilaterally, no wheezing, rhonchi or rales  Abd: soft, nontender, no hepatomegaly  Ext: 1+ pitting edema bilaterally.  Musculoskeletal:  No deformities, BUE and BLE strength normal and equal Skin: warm and dry  Neuro:  CNs 2-12 intact, no focal abnormalities noted Psych:  Normal affect   EKG:  The EKG was personally reviewed and demonstrates: Normal sinus rhythm, heart rate 84 with LVH and slight ST depression along the inferior leads which is possibly secondary to repolarization abnormalities.  Telemetry:  Telemetry was personally reviewed and demonstrates: NSR, HR in 70's to 80's.   Relevant CV Studies:  Echocardiogram: Pending  Laboratory Data:  High Sensitivity Troponin:   Recent Labs  Lab 01/30/22 0003 01/30/22 0351  TROPONINIHS 90* 81*     Chemistry Recent Labs  Lab 01/29/22 1716 01/30/22 0351 01/30/22 0630  NA 137  --   --   K 4.7  --   --   CL 107  --   --   CO2 22  --   --   GLUCOSE 111*  --   --   BUN 24*  --   --   CREATININE 2.50* 2.62*  --   CALCIUM 8.5*  --   --   MG  --   --  1.8  GFRNONAA 30* 29*  --   ANIONGAP 8  --   --     No results for input(s): "PROT", "ALBUMIN", "AST", "ALT", "ALKPHOS", "BILITOT" in the last 168 hours. Lipids  Recent Labs  Lab 01/30/22 0351  CHOL 208*  TRIG 106  HDL 55  LDLCALC 132*  CHOLHDL 3.8    Hematology Recent Labs  Lab 01/29/22 1716 01/30/22 0351  WBC 9.6 9.4  RBC 4.13* 3.78*  HGB 12.4* 11.2*  HCT 37.1* 34.4*  MCV 89.8 91.0  MCH 30.0 29.6  MCHC 33.4 32.6  RDW 12.9 13.1  PLT 233 217   Thyroid No results for input(s): "TSH", "FREET4" in the last 168 hours.   BNP Recent Labs  Lab 01/30/22 0003  BNP 758.7*    DDimer No results for input(s): "DDIMER" in the last 168 hours.   Radiology/Studies:  DG Chest 2 View  Result Date: 01/29/2022 CLINICAL DATA:  Shortness of breath.  Cough. EXAM: CHEST - 2 VIEW COMPARISON:  February 05, 2008 FINDINGS: Mild cardiomegaly. The hila and mediastinum are unremarkable. No pneumothorax. No nodules or masses. No focal infiltrates. IMPRESSION: No active cardiopulmonary disease. Electronically Signed   By: Dorise Bullion III M.D.   On: 01/29/2022 17:53     Assessment and Plan:   1. Acute CHF Exacerbation (Echo pending to determine HFpEF or HFrEF) - Presents with dyspnea and lower extremity edema which is overall concerning for an acute CHF exacerbation. BNP elevated at 758. An echocardiogram is pending to assess for any structural abnormalities. Suspect this is secondary to his uncontrolled hypertension. - He has been started on IV Lasix 40 mg twice daily and will follow I's and O's along with daily weights. Continue to follow renal function closely as it is unclear what his baseline is. Will adjust and titrate GDMT based off echocardiogram results and reassessment of renal function. Hold off on adding SGLT2 for now.   2. Hypertensive Emergency - His BP was at 234/102 while in the ED and had improved to 157/94 on most recent check. Would not lower too quickly as this is likely longstanding. He has been started Amlodipine 10 mg daily and Coreg 6.25 mg twice daily (not ideal in the setting of acute CHF but overall tolerating well at this time). Would add Hydralazine next.  3. Elevated Troponin Values -  Initial and repeat troponin values were flat at 90 and 81. Suspect this is secondary to demand ischemia in the setting of hypertensive emergency in the setting of his acute CHF exacerbation. Recent chest pain is more pleuritic and not consistent with angina. An echocardiogram is pending to assess for any structural  abnormalities.    4. AKI - His creatinine was elevated at 2.50 on admission with no recent values available for comparison. Was previously at 1.01 in 2018. Hopefully this will improve with diuresis.  For questions or updates, please contact [place] Please consult [email or web address] for contact info under    Signed, Erma Heritage, [name]  01/30/2022 9:43 AM

## 2022-01-30 NOTE — H&P (Addendum)
PCP:   Patient, No Pcp Per   Chief Complaint:  Atypical chest pains  HPI: This is a 51 year old male with past medical history of hypertension for 5 years, diabetes mellitus 8 years.  He has never treated either.  3 days ago he developed shortness of breath and sweating.  His legs also started swelling.  Earlier today while doing chores he developed central located chest pains, shortness of breath and again became diaphoretic.  He attributes the chest pain to dry hacking cough he has developed in the past 3 days.  He states his chest is sore after coughing.  His chest pain is not affected by movement he denies heart palpitation.  He he vaguely endorsed some nausea.  At its worst the pain was 6/10, currently 1/10.  Patient has received no nitro in the ER.  He denies fever, chills, lightheadedness or diarrhea.  On presenting to the ER patient's blood pressure was 234/102, EKG without acute ischemic changes.  BNP 758, which initial troponin 90, second troponin 81.  Chest x-ray without evidence of edema.  History provided by the patient is alert and oriented.  Review of Systems:  The patient denies anorexia, fever, weight loss,, vision loss, decreased hearing, hoarseness, chest pain, syncope, dyspnea on exertion, peripheral edema, balance deficits, hemoptysis, abdominal pain, melena, hematochezia, severe indigestion/heartburn, hematuria, incontinence, genital sores, muscle weakness, suspicious skin lesions, transient blindness, difficulty walking, depression, unusual weight change, abnormal bleeding, enlarged lymph nodes, angioedema, and breast masses. Positives: Headaches, chest pain, shortness of breath, diaphoresis  Past Medical History: Past Medical History:  Diagnosis Date   Diabetes mellitus without complication ([place])    Hypertension    Past Surgical History:  Procedure Laterality Date   LEG SURGERY Right    teenager    Medications: Prior to Admission medications   Medication Sig  Start Date End Date Taking? Authorizing Provider  cyclobenzaprine (FLEXERIL) 10 MG tablet Take 1 tablet (10 mg total) by mouth 2 (two) times daily as needed for muscle spasms. Patient not taking: Reported on 07/24/2016 11/09/14   Lysbeth Penner, FNP  HYDROcodone-acetaminophen (NORCO) 5-325 MG tablet Take 1 tablet by mouth 2 (two) times daily as needed for moderate pain. 07/30/18   Aundra Dubin, [name]  ibuprofen (ADVIL,MOTRIN) 600 MG tablet Take 1 tablet (600 mg total) by mouth every 6 (six) hours as needed for moderate pain. Patient not taking: Reported on 07/13/2018 07/24/16   Forde Dandy, MD  naproxen (NAPROSYN) 375 MG tablet Take 1 tablet (375 mg total) by mouth 2 (two) times daily. Patient not taking: Reported on 07/24/2016 11/09/14   Lysbeth Penner, FNP  traMADol (ULTRAM) 50 MG tablet Take 1 tablet (50 mg total) by mouth 3 (three) times daily as needed. 09/22/18   Aundra Dubin, [name]    Allergies:  No Known Allergies  Social History:  reports that he has been smoking cigarettes. He has been smoking an average of 1 pack per day. He has never used smokeless tobacco. He reports current alcohol use. He reports that he does not use drugs.  Family History: DM   Physical Exam: Vitals:   01/29/22 1710 01/29/22 2104  BP: (!) 198/136 (!) 208/105  Pulse: 73 92  Resp: 20 18  Temp: 98.9 F (37.2 C) 98.3 F (36.8 C)  TempSrc: Oral Oral  SpO2: 99% 99%    General:  Alert and oriented times three, well developed and nourished, no acute distress Eyes: PERRLA, pink conjunctiva, no scleral icterus ENT:  Moist oral mucosa, neck supple, no thyromegaly Lungs: clear to ascultation, no wheeze, no crackles, no use of accessory muscles Cardiovascular: regular rate and rhythm, no regurgitation, no gallops, no murmurs. No carotid bruits, positive 1+ JVD, some reproducible chest wall pain Abdomen: soft, positive BS, non-tender, non-distended, no organomegaly, not an acute abdomen GU: not  examined Neuro: CN II - XII grossly intact, sensation intact Musculoskeletal: strength 5/5 all extremities, no clubbing, cyanosis. B/L LE edema R>L (right w chronic swelling secondary to ORIF) edema. 2+ B/L Skin: no rash, no subcutaneous crepitation, no decubitus Psych: appropriate patient   Labs on Admission:  Recent Labs    01/29/22 1716  NA 137  K 4.7  CL 107  CO2 22  GLUCOSE 111*  BUN 24*  CREATININE 2.50*  CALCIUM 8.5*    Recent Labs    01/29/22 1716  WBC 9.6  HGB 12.4*  HCT 37.1*  MCV 89.8  PLT 233      Radiological Exams on Admission: DG Chest 2 View  Result Date: 01/29/2022 CLINICAL DATA:  Shortness of breath.  Cough. EXAM: CHEST - 2 VIEW COMPARISON:  February 05, 2008 FINDINGS: Mild cardiomegaly. The hila and mediastinum are unremarkable. No pneumothorax. No nodules or masses. No focal infiltrates. IMPRESSION: No active cardiopulmonary disease. Electronically Signed   By: Dorise Bullion III M.D.   On: 01/29/2022 17:53    Assessment/Plan Present on Admission:  Hypertensive urgency -admit to med telemetry -coreg and norvasc started, 1st doses now -As needed Lopressor SBP>160 -no ACEI/ARB, unclear if acute or chronic kidney failure  CHF/fluid overload, mild -CHF ordset initiated -2D echo ordered -IV lasix, I/O, daily weight -Nutrition consulted for CHF, DM, HTN dietary education -Swelling RLE>LLE.  Patient with rod RLE, resulting in chronic swelling.  Atypical chest pain/elevated troponin -Elevated troponins secondary to elevated blood pressure -Doubt NSTEMI -Cardiology consult placed  DM uncontrolled -SSI, A1c -lipid panel ordered  CKD stage 3 -unclear if acute or chronic -add mag, phos level -ACE/ARB not initiated -Inpatient vs outpatient nephrology consult -Avoid nephrotoxic agents -Unable to hydrate given fluid overload -I/O's  Tobacco use -nicotine patch -Tobacco cessation recommended  Rhyan Wolters 01/30/2022, 1:55 AM

## 2022-01-30 NOTE — ED Provider Notes (Signed)
[facility] Provider Note   CSN: [id number] Arrival date & time: 01/29/22  1704     History  Chief Complaint  Patient presents with   Shortness of Breath    Samuel Gardner is a 51 y.o. male.  The history is provided by the patient and medical records.  Shortness of Breath  51 y.o. M with hx of DM and HTN (not currently on any medications) presenting to the ED for SOB.  States for the past 3 days he has felt SOB.  Some cough and fells congestion in the chest but not not producing any mucus.  He denies any fever.  States symptoms are worse with lying flat or exertional activity.  Also has some lower extremity edema.  Initially went to urgent care and sent here due to significantly elevated blood pressure.  States he has not been on medications for this in quite some time.  He is a daily smoker, approx 1 PPD.  Denies drug use, specifically crack/cocaine use.  Home Medications Prior to Admission medications   Medication Sig Start Date End Date Taking? Authorizing Provider  cyclobenzaprine (FLEXERIL) 10 MG tablet Take 1 tablet (10 mg total) by mouth 2 (two) times daily as needed for muscle spasms. Patient not taking: Reported on 07/24/2016 11/09/14   Lysbeth Penner, FNP  HYDROcodone-acetaminophen (NORCO) 5-325 MG tablet Take 1 tablet by mouth 2 (two) times daily as needed for moderate pain. 07/30/18   Aundra Dubin, [name]  ibuprofen (ADVIL,MOTRIN) 600 MG tablet Take 1 tablet (600 mg total) by mouth every 6 (six) hours as needed for moderate pain. Patient not taking: Reported on 07/13/2018 07/24/16   Forde Dandy, MD  naproxen (NAPROSYN) 375 MG tablet Take 1 tablet (375 mg total) by mouth 2 (two) times daily. Patient not taking: Reported on 07/24/2016 11/09/14   Lysbeth Penner, FNP  traMADol (ULTRAM) 50 MG tablet Take 1 tablet (50 mg total) by mouth 3 (three) times daily as needed. 09/22/18   Aundra Dubin, [name]      Allergies    Patient has  no known allergies.    Review of Systems   Review of Systems  Respiratory:  Positive for shortness of breath.   All other systems reviewed and are negative.   Physical Exam Updated Vital Signs BP (!) 208/105 (BP Location: Left Arm)   Pulse 92   Temp 98.3 F (36.8 C) (Oral)   Resp 18   SpO2 99%   Physical Exam Vitals and nursing note reviewed.  Constitutional:      Appearance: He is well-developed.  HENT:     Head: Normocephalic and atraumatic.  Eyes:     Conjunctiva/sclera: Conjunctivae normal.     Pupils: Pupils are equal, round, and reactive to light.  Cardiovascular:     Rate and Rhythm: Normal rate and regular rhythm.     Heart sounds: Normal heart sounds.  Pulmonary:     Effort: Pulmonary effort is normal.     Breath sounds: Normal breath sounds. No wheezing, rhonchi or rales.  Abdominal:     General: Bowel sounds are normal.     Palpations: Abdomen is soft.  Musculoskeletal:        General: Normal range of motion.     Cervical back: Normal range of motion.     Comments: 2+ pitting edema BLE, grossly symmetric, no overlying erythema/induration  Skin:    General: Skin is warm and dry.  Neurological:  Mental Status: He is alert and oriented to person, place, and time.     ED Results / Procedures / Treatments   Labs (all labs ordered are listed, but only abnormal results are displayed) Labs Reviewed  BASIC METABOLIC PANEL - Abnormal; Notable for the following components:      Result Value   Glucose, Bld 111 (*)    BUN 24 (*)    Creatinine, Ser 2.50 (*)    Calcium 8.5 (*)    GFR, Estimated 30 (*)    All other components within normal limits  CBC - Abnormal; Notable for the following components:   RBC 4.13 (*)    Hemoglobin 12.4 (*)    HCT 37.1 (*)    All other components within normal limits  BRAIN NATRIURETIC PEPTIDE - Abnormal; Notable for the following components:   B Natriuretic Peptide 758.7 (*)    All other components within normal limits   TROPONIN I (HIGH SENSITIVITY) - Abnormal; Notable for the following components:   Troponin I (High Sensitivity) 90 (*)    All other components within normal limits  TROPONIN I (HIGH SENSITIVITY)    EKG None  Radiology DG Chest 2 View  Result Date: 01/29/2022 CLINICAL DATA:  Shortness of breath.  Cough. EXAM: CHEST - 2 VIEW COMPARISON:  February 05, 2008 FINDINGS: Mild cardiomegaly. The hila and mediastinum are unremarkable. No pneumothorax. No nodules or masses. No focal infiltrates. IMPRESSION: No active cardiopulmonary disease. Electronically Signed   By: Dorise Bullion III M.D.   On: 01/29/2022 17:53    Procedures Procedures    CRITICAL CARE Performed by: Larene Pickett   Total critical care time: 35 minutes  Critical care time was exclusive of separately billable procedures and treating other patients.  Critical care was necessary to treat or prevent imminent or life-threatening deterioration.  Critical care was time spent personally by me on the following activities: development of treatment plan with patient and/or surrogate as well as nursing, discussions with consultants, evaluation of patient's response to treatment, examination of patient, obtaining history from patient or surrogate, ordering and performing treatments and interventions, ordering and review of laboratory studies, ordering and review of radiographic studies, pulse oximetry and re-evaluation of patient's condition.   Medications Ordered in ED Medications  furosemide (LASIX) injection 40 mg (has no administration in time range)  nicotine (NICODERM CQ - dosed in mg/24 hours) patch 14 mg (has no administration in time range)  sodium chloride flush (NS) 0.9 % injection 3 mL (has no administration in time range)  sodium chloride flush (NS) 0.9 % injection 3 mL (has no administration in time range)  0.9 %  sodium chloride infusion (has no administration in time range)  acetaminophen (TYLENOL) tablet 650 mg  (has no administration in time range)  ondansetron (ZOFRAN) injection 4 mg (has no administration in time range)  heparin injection 5,000 Units (has no administration in time range)  furosemide (LASIX) injection 40 mg (has no administration in time range)  carvedilol (COREG) tablet 6.25 mg (has no administration in time range)  hydrALAZINE (APRESOLINE) injection 10 mg (has no administration in time range)  insulin aspart (novoLOG) injection 0-15 Units (has no administration in time range)  insulin aspart (novoLOG) injection 0-5 Units (has no administration in time range)    ED Course/ Medical Decision Making/ A&P                           Medical Decision  Making Amount and/or Complexity of Data Reviewed Labs: ordered. Radiology: ordered and independent interpretation performed. ECG/medicine tests: ordered and independent interpretation performed.  Risk Prescription drug management. Decision regarding hospitalization.   51 year old male presenting to the ED for shortness of breath.  Ongoing over the past 3 days.  He also endorses lower extremity edema, nighttime orthopnea, and dyspnea on exertion.  History of hypertension, has not been on medication in quite some time.  He is awake, alert, oriented on my exam.  He is nontoxic in appearance.  He does not appear to be in any acute distress on air.  He is able to speak in full sentences.  He does have 2+ pitting edema of the bilateral lower extremities.  He is hypertensive, 564'P systolic.  EKG non-ischemic.  Labs as above-- SrCr 2.5, last on record from 5 years ago and was normal at that time.  BNP 758.  Trop 90.  Suspect likely some demand ischemia.  He does not have any active chest pain.  He does appear fluid overloaded on exam.  Will give dose of lasix.  Suspect clinical picture brought on by uncontrolled HTN-- has not taken meds in quite some time.  Will require admission for ongoing care.  Discussed with Dr. Claria Dice-- will admit for  ongoing care.  Final Clinical Impression(s) / ED Diagnoses Final diagnoses:  Acute congestive heart failure, unspecified heart failure type ([place])  Hypertension, unspecified type    Rx / DC Orders ED Discharge Orders     None         Larene Pickett, [name] 01/30/22 0346    Orpah Greek, MD 01/30/22 [phone]

## 2022-01-30 NOTE — Progress Notes (Signed)
  Echocardiogram 2D Echocardiogram has been performed.  Samuel Gardner 01/30/2022, 3:50 PM

## 2022-01-31 ENCOUNTER — Inpatient Hospital Stay (HOSPITAL_COMMUNITY): Payer: Self-pay

## 2022-01-31 DIAGNOSIS — N183 Chronic kidney disease, stage 3 unspecified: Secondary | ICD-10-CM

## 2022-01-31 DIAGNOSIS — I5031 Acute diastolic (congestive) heart failure: Secondary | ICD-10-CM

## 2022-01-31 LAB — GLUCOSE, CAPILLARY
Glucose-Capillary: 165 mg/dL — ABNORMAL HIGH (ref 70–99)
Glucose-Capillary: 135 mg/dL — ABNORMAL HIGH (ref 70–99)
Glucose-Capillary: 122 mg/dL — ABNORMAL HIGH (ref 70–99)
Glucose-Capillary: 170 mg/dL — ABNORMAL HIGH (ref 70–99)

## 2022-01-31 LAB — HEPATIC FUNCTION PANEL
ALT: 18 U/L (ref 0–44)
AST: 25 U/L (ref 15–41)
Albumin: 2.1 g/dL — ABNORMAL LOW (ref 3.5–5.0)
Alkaline Phosphatase: 57 U/L (ref 38–126)
Bilirubin, Direct: <0.1 mg/dL (ref 0.0–0.2)
Total Bilirubin: 0.4 mg/dL (ref 0.3–1.2)
Total Protein: 4.8 g/dL — ABNORMAL LOW (ref 6.5–8.1)

## 2022-01-31 LAB — BASIC METABOLIC PANEL
Anion gap: 7 (ref 5–15)
BUN: 29 mg/dL — ABNORMAL HIGH (ref 6–20)
CO2: 23 mmol/L (ref 22–32)
Calcium: 8.4 mg/dL — ABNORMAL LOW (ref 8.9–10.3)
Chloride: 109 mmol/L (ref 98–111)
Creatinine, Ser: 3.21 mg/dL — ABNORMAL HIGH (ref 0.61–1.24)
GFR, Estimated: 22 mL/min — ABNORMAL LOW (ref >60)
Glucose, Bld: 87 mg/dL (ref 70–99)
Potassium: 4.5 mmol/L (ref 3.5–5.1)
Sodium: 139 mmol/L (ref 135–145)

## 2022-01-31 LAB — MAGNESIUM: Magnesium: 1.7 mg/dL (ref 1.7–2.4)

## 2022-01-31 NOTE — Progress Notes (Addendum)
Rounding Note    Patient Name: Samuel Gardner Date of Encounter: 01/31/2022  [facility] Cardiologist: Fransico Him, MD   Subjective   Found sitting up in bed, feels well, no CP or SOB  Inpatient Medications    Scheduled Meds:  amLODipine  10 mg Oral Daily   carvedilol  6.25 mg Oral BID WC   heparin  5,000 Units Subcutaneous Q8H   insulin aspart  0-15 Units Subcutaneous TID WC   insulin aspart  0-5 Units Subcutaneous QHS   ipratropium-albuterol  3 mL Nebulization Q6H   nicotine  14 mg Transdermal Daily   sodium chloride flush  3 mL Intravenous Q12H   Continuous Infusions:  sodium chloride     PRN Meds: sodium chloride, acetaminophen, albuterol, hydrALAZINE, ondansetron (ZOFRAN) IV, sodium chloride flush   Vital Signs    Vitals:   01/31/22 0021 01/31/22 0414 01/31/22 0749 01/31/22 0841  BP: (!) 142/63 (!) 153/60 (!) 159/70   Pulse: 92 87 86   Resp: 16 16 18    Temp: 100 F (37.8 C) 99.4 F (37.4 C) 99.4 F (37.4 C)   TempSrc: Oral Oral Oral   SpO2: 96% 99% 100% 97%  Weight:      Height:        Intake/Output Summary (Last 24 hours) at 01/31/2022 1007 Last data filed at 01/31/2022 0500 Gross per 24 hour  Intake 960 ml  Output 2150 ml  Net -1190 ml      01/30/2022    8:22 PM 01/30/2022    4:55 PM 07/17/2018    8:54 AM  Last 3 Weights  Weight (lbs) 162 lb 6.4 oz 164 lb 14.5 oz 165 lb  Weight (kg) 73.664 kg 74.8 kg 74.844 kg      Telemetry    Sinus rhythm 60-80s - Personally Reviewed  ECG    Sinus rhythm with HR  95, LVH, old anterior infarct, Inferior and lateral STD, no prior for comparison - Personally Reviewed  Physical Exam   GEN: No acute distress.   Neck: No JVD Cardiac: RRR, no murmurs, rubs, or gallops.  Respiratory: wheezing throughout GI: Soft, nontender, non-distended  MS: No edema; No deformity. Neuro:  Nonfocal  Psych: Normal affect   Labs    High Sensitivity Troponin:   Recent Labs  Lab 01/30/22 0003  01/30/22 0351  TROPONINIHS 90* 81*     Chemistry Recent Labs  Lab 01/29/22 1716 01/30/22 0351 01/30/22 0630 01/31/22 0207  NA 137  --   --  139  K 4.7  --   --  4.5  CL 107  --   --  109  CO2 22  --   --  23  GLUCOSE 111*  --   --  87  BUN 24*  --   --  29*  CREATININE 2.50* 2.62*  --  3.21*  CALCIUM 8.5*  --   --  8.4*  MG  --   --  1.8 1.7  PROT  --   --   --  4.8*  ALBUMIN  --   --   --  2.1*  AST  --   --   --  25  ALT  --   --   --  18  ALKPHOS  --   --   --  57  BILITOT  --   --   --  0.4  GFRNONAA 30* 29*  --  22*  ANIONGAP 8  --   --  7    Lipids  Recent Labs  Lab 01/30/22 0351  CHOL 208*  TRIG 106  HDL 55  LDLCALC 132*  CHOLHDL 3.8    Hematology Recent Labs  Lab 01/29/22 1716 01/30/22 0351  WBC 9.6 9.4  RBC 4.13* 3.78*  HGB 12.4* 11.2*  HCT 37.1* 34.4*  MCV 89.8 91.0  MCH 30.0 29.6  MCHC 33.4 32.6  RDW 12.9 13.1  PLT 233 217   Thyroid No results for input(s): "TSH", "FREET4" in the last 168 hours.  BNP Recent Labs  Lab 01/30/22 0003  BNP 758.7*    DDimer No results for input(s): "DDIMER" in the last 168 hours.   Radiology    US RENAL  Result Date: 01/31/2022 CLINICAL DATA:  Renal failure EXAM: RENAL / URINARY TRACT ULTRASOUND COMPLETE COMPARISON:  None available FINDINGS: Right Kidney: Renal measurements: 11.0 x 5.1 x 5.7 cm = volume: 166 mL. Normal cortical thickness. Increased cortical echogenicity. No mass, hydronephrosis, or shadowing calcification. Left Kidney: Renal measurements: 10.8 x 6.3 x 7.1 cm = volume: 252 mL. Normal cortical thickness. Increased cortical echogenicity. No mass, hydronephrosis, or shadowing calcification. Bladder: Appears normal for degree of bladder distention. Other: N/A IMPRESSION: Medical renal disease changes of both kidneys. No evidence of renal mass or hydronephrosis. Electronically Signed   By: Lavonia Dana M.D.   On: 01/31/2022 08:45   ECHOCARDIOGRAM COMPLETE  Result Date: 01/30/2022     ECHOCARDIOGRAM REPORT   Patient Name:   Samuel Gardner Date of Exam: 01/30/2022 Medical Rec #:  [id number]       Height:       67.0 in Accession #:    [id number]      Weight:       165.0 lb Date of Birth:  01-18-1971        BSA:          1.863 m Patient Age:    51 years        BP:           178/102 mmHg Patient Gender: M               HR:           91 bpm. Exam Location:  Inpatient Procedure: 2D Echo, 3D Echo, Cardiac Doppler and Color Doppler Indications:    CHF-Acute Diastolic X38.18  History:        Patient has no prior history of Echocardiogram examinations.                 Signs/Symptoms:Shortness of Breath and Chest Pain; Risk                 Factors:Current Smoker.  Sonographer:    Greer Pickerel Referring Phys: [phone] DEBBY CROSLEY  Sonographer Comments: Image acquisition challenging due to respiratory motion. IMPRESSIONS  1. False tendon in the LV apex of no clinical significance     . Left ventricular ejection fraction, by estimation, is 55 to 60%. The left ventricle has normal function. The left ventricle has no regional wall motion abnormalities. There is severe left ventricular hypertrophy of the infero-lateral segment. Left ventricular diastolic parameters are consistent with Grade I diastolic dysfunction (impaired relaxation). Elevated left ventricular end-diastolic pressure.  2. Right ventricular systolic function is normal. The right ventricular size is normal. Tricuspid regurgitation signal is inadequate for assessing PA pressure.  3. Left atrial size was mildly dilated.  4. A small pericardial effusion is present. The pericardial effusion is anterior to the right  ventricle and localized near the right atrium.  5. The mitral valve is normal in structure. Trivial mitral valve regurgitation. No evidence of mitral stenosis.  6. The aortic valve is tricuspid. Aortic valve regurgitation is not visualized. Aortic valve sclerosis/calcification is present, without any evidence of aortic stenosis.  7. The  inferior vena cava is normal in size with greater than 50% respiratory variability, suggesting right atrial pressure of 3 mmHg. FINDINGS  Left Ventricle: False tendon in the LV apex of no clinical significance. Left ventricular ejection fraction, by estimation, is 55 to 60%. The left ventricle has normal function. The left ventricle has no regional wall motion abnormalities. 3D left ventricular ejection fraction analysis performed but not reported based on interpreter judgement due to suboptimal tracking. The left ventricular internal cavity size was normal in size. There is severe left ventricular hypertrophy of the infero-lateral segment. Left ventricular diastolic parameters are consistent with Grade I diastolic dysfunction (impaired relaxation). Elevated left ventricular end-diastolic pressure. Right Ventricle: The right ventricular size is normal. No increase in right ventricular wall thickness. Right ventricular systolic function is normal. Tricuspid regurgitation signal is inadequate for assessing PA pressure. Left Atrium: Left atrial size was mildly dilated. Right Atrium: Right atrial size was normal in size. Pericardium: A small pericardial effusion is present. The pericardial effusion is anterior to the right ventricle and localized near the right atrium. Mitral Valve: The mitral valve is normal in structure. Trivial mitral valve regurgitation. No evidence of mitral valve stenosis. Tricuspid Valve: The tricuspid valve is normal in structure. Tricuspid valve regurgitation is not demonstrated. No evidence of tricuspid stenosis. Aortic Valve: The aortic valve is tricuspid. Aortic valve regurgitation is not visualized. Aortic valve sclerosis/calcification is present, without any evidence of aortic stenosis. Pulmonic Valve: The pulmonic valve was normal in structure. Pulmonic valve regurgitation is not visualized. No evidence of pulmonic stenosis. Aorta: The aortic root is normal in size and structure. Venous:  The inferior vena cava is normal in size with greater than 50% respiratory variability, suggesting right atrial pressure of 3 mmHg. IAS/Shunts: No atrial level shunt detected by color flow Doppler. Additional Comments: There is pleural effusion in the left lateral region.  LEFT VENTRICLE PLAX 2D LVIDd:         4.60 cm   Diastology LVIDs:         3.40 cm   LV e' medial:    5.77 cm/s LV PW:         1.60 cm   LV E/e' medial:  18.4 LV IVS:        1.20 cm   LV e' lateral:   6.20 cm/s LVOT diam:     1.70 cm   LV E/e' lateral: 17.1 LV SV:         47 LV SV Index:   25 LVOT Area:     2.27 cm                           3D Volume EF:                          3D EF:        49 %                          LV EDV:       177 ml  LV ESV:       91 ml                          LV SV:        87 ml RIGHT VENTRICLE RV S prime:     21.40 cm/s TAPSE (M-mode): 3.1 cm LEFT ATRIUM             Index        RIGHT ATRIUM           Index LA diam:        3.70 cm 1.99 cm/m   RA Area:     18.30 cm LA Vol (A2C):   83.8 ml 44.97 ml/m  RA Volume:   47.60 ml  25.54 ml/m LA Vol (A4C):   57.3 ml 30.75 ml/m LA Biplane Vol: 72.6 ml 38.96 ml/m  AORTIC VALVE LVOT Vmax:   137.00 cm/s LVOT Vmean:  89.400 cm/s LVOT VTI:    0.209 m  AORTA Ao Root diam: 3.50 cm Ao Asc diam:  2.70 cm MITRAL VALVE MV Area (PHT): 4.06 cm     SHUNTS MV Decel Time: 187 msec     Systemic VTI:  0.21 m MV E velocity: 106.00 cm/s  Systemic Diam: 1.70 cm MV A velocity: 145.00 cm/s MV E/A ratio:  0.73 Fransico Him MD Electronically signed by Fransico Him MD Signature Date/Time: 01/30/2022/5:46:32 PM    Final    DG Chest 2 View  Result Date: 01/29/2022 CLINICAL DATA:  Shortness of breath.  Cough. EXAM: CHEST - 2 VIEW COMPARISON:  February 05, 2008 FINDINGS: Mild cardiomegaly. The hila and mediastinum are unremarkable. No pneumothorax. No nodules or masses. No focal infiltrates. IMPRESSION: No active cardiopulmonary disease. Electronically Signed   By: Dorise Bullion III M.D.   On: 01/29/2022 17:53    Cardiac Studies   Echo 01/30/22: 1. False tendon in the LV apex of no clinical significance     Left ventricular ejection fraction, by estimation, is 55 to 60%. The  left ventricle has normal function. The left ventricle has no regional  wall motion abnormalities. There is severe left ventricular hypertrophy of  the infero-lateral segment. Left  ventricular diastolic parameters are consistent with Grade I diastolic  dysfunction (impaired relaxation). Elevated left ventricular end-diastolic  pressure.   2. Right ventricular systolic function is normal. The right ventricular  size is normal. Tricuspid regurgitation signal is inadequate for assessing  PA pressure.   3. Left atrial size was mildly dilated.   4. A small pericardial effusion is present. The pericardial effusion is  anterior to the right ventricle and localized near the right atrium.   5. The mitral valve is normal in structure. Trivial mitral valve  regurgitation. No evidence of mitral stenosis.   6. The aortic valve is tricuspid. Aortic valve regurgitation is not  visualized. Aortic valve sclerosis/calcification is present, without any  evidence of aortic stenosis.   7. The inferior vena cava is normal in size with greater than 50%  respiratory variability, suggesting right atrial pressure of 3 mmHg.   Patient Profile     51 y.o. male  with a hx of HTN, Type 2 DM, tobacco use and medication noncompliance who is being seen for the evaluation of CHF and HTN.  Assessment & Plan    Uncontrolled hypertension Hypertensive urgency - presenting BP was 234/102 - echo this admission with preserved EF, mild DD, and severe LVH - 10  mg amlodipine, 6.25 mg coreg (should be OK in the setting of cocaine) - SBP now in the 140-150s - no ACEI/ARB/ARNI/MRA give renal function - HR in the 80s on telemetry, but in the 60s on exam, could potentially increase coreg vs addition of  hydralazine/nitrate, although TID dosing will likely cause compliance issues   Severe LVH, Grade 1 DD Hypertensive heart disease Likely due to longstanding uncontrolled hypertension BNP 760, albumin 2.1 Was diuresing on 40 mg IV lasix BID, now held for worsening renal function    Shortness of breath, chest pain Elevated troponin HST 90 --> 81 - suspect demand ischemia in the setting of hypertensive urgency - echo with preserved EF and no RWMA - hold off on LHC, but may consider nuclear stress test vs CT coronary if renal function improves - no further chest pain or SOB this admission   AKI Normal renal function in 2018 sCr rising 2.50 --> 3.21 today Question a component of CKD given uncontrolled HTN and DM Renal ultrasound, per primary   DM with hyperglycemia A1c in process Per primary May benefit from SGLT2i from a cardiac standpoint   Medication noncompliance Stressed importance of controlling BP and DM He states he is able to take medications twice daily, he is not working right now.   Substance abuse UDS positive for cocaine this admission   Tobacco abuse / current smoker Suspect likely COPD   Disposition We had a discussion regarding drug use, smoking, medication compliance, low salt diet, and blood pressure monitoring, He seems motivated and reports that he will attempt to stop smoking. I also described risk of BB with cocaine use and cautioned against continued cocaine use. He expressed understanding. Wife is at bedside. He does not have a primary care provider and is open to case management help to get established. I also mentioned hypertension clinic at follow up.     For questions or updates, please contact [place] Please consult [email or web address] for contact info under        Signed, Ledora Bottcher, PA  01/31/2022, 10:07 AM    I have seen and examined the patient along with Ledora Bottcher, PA .  I have reviewed the chart, notes  and new data.  I agree with PA/NP's note.  Key new complaints: no current dyspnea or chest pain Key examination changes: fair BP control, SBP slightly above target Key new findings / data: worsening renal dysfunction. Creat was 1.0 in 2018, nothing available in the intervening 5 years.  PLAN: Diuretics on hold for AKI. Suspect that long term he will require a diuretic as part of his BP control regimen. Plan amlodipine 10 mg daily. There is room to increase the carvedilol to 12.5 mg twice daily. Agree we should avoid hydralazine due to compliance issues. Not on RAAS inh due to AKI. Minor increase in hsTrop I is not indicative of CAD. Not a candidate for contrast based procedures at this time. Avoid cocaine/other stimulants. Low Na diet.  Sanda Klein, MD, [place] [phone] 01/31/2022, 12:55 PM

## 2022-01-31 NOTE — Progress Notes (Signed)
PROGRESS NOTE    Marvel Mcphillips  GDJ:[id number] DOB: 1970-04-10 DOA: 01/29/2022 PCP: Patient, No Pcp Per   Brief Narrative:  51 year old male with history of hypertension, diabetes mellitus type 2, not on any medications for these presented with chest pains along with shortness of breath.  On presentation, blood pressure was 234/102.  EKG showed no acute ischemic changes.  BNP of 758, initial troponin of 92nd troponin of 81.  Chest x-ray without evidence of edema.  He was started on IV Lasix.  Cardiology was consulted.  Assessment & Plan:   Acute unspecified CHF exacerbation -Echo showed EF of 55 to 60% with grade 1 diastolic dysfunction.  Cardiology following.  Strict input and output.  Daily weights.  Fluid restriction.  Continue Coreg. -Negative balance of 1190 cc has admission. -Hold IV Lasix today because of worsening kidney function.  Elevated troponin/chest pain/shortness of breath -Possibly from demand ischemia from above and hypertensive urgency -Echo as above.  Cardiology following.  Troponins did not trend upwards.  Hypertensive urgency -Presented with blood pressure of 234/102.  Does not take any medications at home.  Blood pressure improving.  Continue amlodipine, Coreg.  IV Lasix on hold.  AKI with possible chronic kidney disease stage IIIa or b (unknown) -Creatinine 2.5 on presentation.  Probably has some sort of chronic kidney disease; last creatinine documented was 1.01 in 2015. -Creatinine worsening to 3.21.  Check renal ultrasound.  Possible anemia of chronic disease -Possibly from kidney disease  Tobacco abuse -Currently on nicotine patch.  Tobacco cessation recommended  Hyperlipidemia -LDL 130.  Cholesterol 208.  She will follow-up with PCP for treatment of the same: Diet and exercise with or without medications  DVT prophylaxis: Heparin subcutaneous Code Status: Full Family Communication: None at bedside  disposition Plan: Status is: Inpatient Remains  inpatient appropriate because: Of severity of illness    Consultants: Cardiology  Procedures: Echo  Antimicrobials: None   Subjective: Patient seen and examined at bedside.  Had some fever overnight.  Still complains of intermittent shortness of breath but improving.  Denies any current chest pain.  No abdominal pain, vomiting reported.  Objective: Vitals:   01/30/22 2022 01/30/22 2215 01/31/22 0021 01/31/22 0414  BP: (!) 170/99 (!) 158/137 (!) 142/63 (!) 153/60  Pulse: 95 84 92 87  Resp:   16 16  Temp: (!) 100.4 F (38 C)  100 F (37.8 C) 99.4 F (37.4 C)  TempSrc: Oral  Oral Oral  SpO2: 96% 93% 96% 99%  Weight: 73.7 kg     Height: 5\' 6"  (1.676 m)       Intake/Output Summary (Last 24 hours) at 01/31/2022 0731 Last data filed at 01/31/2022 0500 Gross per 24 hour  Intake 960 ml  Output 2150 ml  Net -1190 ml   Filed Weights   01/30/22 1655 01/30/22 2022  Weight: 74.8 kg 73.7 kg    Examination:  General exam: Appears calm and comfortable.  Currently on room air Respiratory system: Bilateral decreased breath sounds at baseswith scattered crackles Cardiovascular system: S1 & S2 heard, Rate controlled Gastrointestinal system: Abdomen is nondistended, soft and nontender. Normal bowel sounds heard. Extremities: No cyanosis, clubbing; bilateral lower extremity present Central nervous system: Alert and oriented. No focal neurological deficits. Moving extremities Skin: No rashes, lesions or ulcers Psychiatry: Judgement and insight appear normal. Mood & affect appropriate.     Data Reviewed: I have personally reviewed following labs and imaging studies  CBC: Recent Labs  Lab 01/29/22 1716 01/30/22 0351  WBC 9.6 9.4  NEUTROABS  --  6.9  HGB 12.4* 11.2*  HCT 37.1* 34.4*  MCV 89.8 91.0  PLT 233 604   Basic Metabolic Panel: Recent Labs  Lab 01/29/22 1716 01/30/22 0351 01/30/22 0630 01/31/22 0207  NA 137  --   --  139  K 4.7  --   --  4.5  CL 107  --   --   109  CO2 22  --   --  23  GLUCOSE 111*  --   --  87  BUN 24*  --   --  29*  CREATININE 2.50* 2.62*  --  3.21*  CALCIUM 8.5*  --   --  8.4*  MG  --   --  1.8 1.7  PHOS  --   --  3.7  --    GFR: Estimated Creatinine Clearance: 24.6 mL/min (A) (by C-G formula based on SCr of 3.21 mg/dL (H)). Liver Function Tests: Recent Labs  Lab 01/31/22 0207  AST 25  ALT 18  ALKPHOS 57  BILITOT 0.4  PROT 4.8*  ALBUMIN 2.1*   No results for input(s): "LIPASE", "AMYLASE" in the last 168 hours. No results for input(s): "AMMONIA" in the last 168 hours. Coagulation Profile: No results for input(s): "INR", "PROTIME" in the last 168 hours. Cardiac Enzymes: No results for input(s): "CKTOTAL", "CKMB", "CKMBINDEX", "TROPONINI" in the last 168 hours. BNP (last 3 results) No results for input(s): "PROBNP" in the last 8760 hours. HbA1C: No results for input(s): "HGBA1C" in the last 72 hours. CBG: Recent Labs  Lab 01/30/22 0931 01/30/22 1144 01/30/22 1635 01/30/22 2219  GLUCAP 80 99 82 111*   Lipid Profile: Recent Labs    01/30/22 0351  CHOL 208*  HDL 55  LDLCALC 132*  TRIG 106  CHOLHDL 3.8   Thyroid Function Tests: No results for input(s): "TSH", "T4TOTAL", "FREET4", "T3FREE", "THYROIDAB" in the last 72 hours. Anemia Panel: No results for input(s): "VITAMINB12", "FOLATE", "FERRITIN", "TIBC", "IRON", "RETICCTPCT" in the last 72 hours. Sepsis Labs: No results for input(s): "PROCALCITON", "LATICACIDVEN" in the last 168 hours.  No results found for this or any previous visit (from the past 240 hour(s)).       Radiology Studies: ECHOCARDIOGRAM COMPLETE  Result Date: 01/30/2022    ECHOCARDIOGRAM REPORT   Patient Name:   CORYDON SCHWEISS Date of Exam: 01/30/2022 Medical Rec #:  [id number]       Height:       67.0 in Accession #:    [id number]      Weight:       165.0 lb Date of Birth:  [birth year]        BSA:          1.863 m Patient Age:    71 years        BP:           178/102 mmHg  Patient Gender: M               HR:           91 bpm. Exam Location:  Inpatient Procedure: 2D Echo, 3D Echo, Cardiac Doppler and Color Doppler Indications:    CHF-Acute Diastolic Y86.57  History:        Patient has no prior history of Echocardiogram examinations.                 Signs/Symptoms:Shortness of Breath and Chest Pain; Risk  Factors:Current Smoker.  Sonographer:    Greer Pickerel Referring Phys: [phone] DEBBY CROSLEY  Sonographer Comments: Image acquisition challenging due to respiratory motion. IMPRESSIONS  1. False tendon in the LV apex of no clinical significance     . Left ventricular ejection fraction, by estimation, is 55 to 60%. The left ventricle has normal function. The left ventricle has no regional wall motion abnormalities. There is severe left ventricular hypertrophy of the infero-lateral segment. Left ventricular diastolic parameters are consistent with Grade I diastolic dysfunction (impaired relaxation). Elevated left ventricular end-diastolic pressure.  2. Right ventricular systolic function is normal. The right ventricular size is normal. Tricuspid regurgitation signal is inadequate for assessing PA pressure.  3. Left atrial size was mildly dilated.  4. A small pericardial effusion is present. The pericardial effusion is anterior to the right ventricle and localized near the right atrium.  5. The mitral valve is normal in structure. Trivial mitral valve regurgitation. No evidence of mitral stenosis.  6. The aortic valve is tricuspid. Aortic valve regurgitation is not visualized. Aortic valve sclerosis/calcification is present, without any evidence of aortic stenosis.  7. The inferior vena cava is normal in size with greater than 50% respiratory variability, suggesting right atrial pressure of 3 mmHg. FINDINGS  Left Ventricle: False tendon in the LV apex of no clinical significance. Left ventricular ejection fraction, by estimation, is 55 to 60%. The left ventricle has normal  function. The left ventricle has no regional wall motion abnormalities. 3D left ventricular ejection fraction analysis performed but not reported based on interpreter judgement due to suboptimal tracking. The left ventricular internal cavity size was normal in size. There is severe left ventricular hypertrophy of the infero-lateral segment. Left ventricular diastolic parameters are consistent with Grade I diastolic dysfunction (impaired relaxation). Elevated left ventricular end-diastolic pressure. Right Ventricle: The right ventricular size is normal. No increase in right ventricular wall thickness. Right ventricular systolic function is normal. Tricuspid regurgitation signal is inadequate for assessing PA pressure. Left Atrium: Left atrial size was mildly dilated. Right Atrium: Right atrial size was normal in size. Pericardium: A small pericardial effusion is present. The pericardial effusion is anterior to the right ventricle and localized near the right atrium. Mitral Valve: The mitral valve is normal in structure. Trivial mitral valve regurgitation. No evidence of mitral valve stenosis. Tricuspid Valve: The tricuspid valve is normal in structure. Tricuspid valve regurgitation is not demonstrated. No evidence of tricuspid stenosis. Aortic Valve: The aortic valve is tricuspid. Aortic valve regurgitation is not visualized. Aortic valve sclerosis/calcification is present, without any evidence of aortic stenosis. Pulmonic Valve: The pulmonic valve was normal in structure. Pulmonic valve regurgitation is not visualized. No evidence of pulmonic stenosis. Aorta: The aortic root is normal in size and structure. Venous: The inferior vena cava is normal in size with greater than 50% respiratory variability, suggesting right atrial pressure of 3 mmHg. IAS/Shunts: No atrial level shunt detected by color flow Doppler. Additional Comments: There is pleural effusion in the left lateral region.  LEFT VENTRICLE PLAX 2D LVIDd:          4.60 cm   Diastology LVIDs:         3.40 cm   LV e' medial:    5.77 cm/s LV PW:         1.60 cm   LV E/e' medial:  18.4 LV IVS:        1.20 cm   LV e' lateral:   6.20 cm/s LVOT diam:  1.70 cm   LV E/e' lateral: 17.1 LV SV:         47 LV SV Index:   25 LVOT Area:     2.27 cm                           3D Volume EF:                          3D EF:        49 %                          LV EDV:       177 ml                          LV ESV:       91 ml                          LV SV:        87 ml RIGHT VENTRICLE RV S prime:     21.40 cm/s TAPSE (M-mode): 3.1 cm LEFT ATRIUM             Index        RIGHT ATRIUM           Index LA diam:        3.70 cm 1.99 cm/m   RA Area:     18.30 cm LA Vol (A2C):   83.8 ml 44.97 ml/m  RA Volume:   47.60 ml  25.54 ml/m LA Vol (A4C):   57.3 ml 30.75 ml/m LA Biplane Vol: 72.6 ml 38.96 ml/m  AORTIC VALVE LVOT Vmax:   137.00 cm/s LVOT Vmean:  89.400 cm/s LVOT VTI:    0.209 m  AORTA Ao Root diam: 3.50 cm Ao Asc diam:  2.70 cm MITRAL VALVE MV Area (PHT): 4.06 cm     SHUNTS MV Decel Time: 187 msec     Systemic VTI:  0.21 m MV E velocity: 106.00 cm/s  Systemic Diam: 1.70 cm MV A velocity: 145.00 cm/s MV E/A ratio:  0.73 Fransico Him MD Electronically signed by Fransico Him MD Signature Date/Time: 01/30/2022/5:46:32 PM    Final    DG Chest 2 View  Result Date: 01/29/2022 CLINICAL DATA:  Shortness of breath.  Cough. EXAM: CHEST - 2 VIEW COMPARISON:  February 05, 2008 FINDINGS: Mild cardiomegaly. The hila and mediastinum are unremarkable. No pneumothorax. No nodules or masses. No focal infiltrates. IMPRESSION: No active cardiopulmonary disease. Electronically Signed   By: Dorise Bullion III M.D.   On: 01/29/2022 17:53        Scheduled Meds:  amLODipine  10 mg Oral Daily   carvedilol  6.25 mg Oral BID WC   heparin  5,000 Units Subcutaneous Q8H   insulin aspart  0-15 Units Subcutaneous TID WC   insulin aspart  0-5 Units Subcutaneous QHS   ipratropium-albuterol  3 mL  Nebulization Q6H   nicotine  14 mg Transdermal Daily   sodium chloride flush  3 mL Intravenous Q12H   Continuous Infusions:  sodium chloride            Aline August, MD [facility] 01/31/2022, 7:31 AM

## 2022-01-31 NOTE — Care Management (Signed)
  Transition of Care [facility]) Screening Note   Patient Details  Name: Suhan Paci Date of Birth: [birth year]   Transition of Care [facility]) CM/SW Contact:    Bethena Roys, RN Phone Number: 01/31/2022, 2:07 PM    Transition of Care Department [facility]) has reviewed the patient. Case Manager received a consult for PCP needs. Case Manager scheduled an appointment at the [facility]-Information placed on the AVS. Case Manager will continue to monitor patient advancement through interdisciplinary progression rounds. If new patient transition needs arise, please place a TOC consult.

## 2022-01-31 NOTE — Plan of Care (Signed)
Nutrition Education Note  RD consulted for nutrition education regarding HTN, CHF and diabetes.  No results found for: "HGBA1C"   RD provided "Heart Healthy, Consistent Carbohydrate Nutrition Therapy" handout from the [employer] of [employer]. Reviewed patient's dietary recall. Which includes beef, a variety of fruits and vegetables and carbohydrates. He also reports that he uses lots of salt for flavoring.   Provided examples on ways to decrease sodium intake in diet and provided substitutions. Discouraged intake of processed foods and use of salt shaker. Encouraged fresh fruits and vegetables as well as whole grain sources of carbohydrates to maximize fiber intake.   RD discussed why it is important for patient to adhere to diet recommendations, and emphasized the role of fluids, foods to avoid, and importance of weighing self daily.   Discussed different food groups and their effects on blood sugar, emphasizing carbohydrate-containing foods. Provided list of carbohydrates and recommended serving sizes of common foods.  Discussed importance of controlled and consistent carbohydrate intake throughout the day. Provided examples of ways to balance meals/snacks and encouraged intake of high-fiber, whole grain complex carbohydrates. Teach back method used.  Expect good compliance. He is highly motivated to take control of his health and making healthy nutrition changes.   Body mass index is 26.21 kg/m.   Current diet order is Heart Healthy, 159ml fluid restriction, patient reports that he is consuming approximately 100% of meals at this time. Labs and medications reviewed. No further nutrition interventions warranted at this time. RD contact information provided. If additional nutrition issues arise, please re-consult RD.   Clayborne Dana, RDN, LDN Clinical Nutrition

## 2022-01-31 NOTE — Progress Notes (Signed)
Heart Failure Navigator Progress Note  Assessed for Heart & Vascular TOC clinic readiness.  Patient does not meet criteria due to admission for Hypertensive Urgency vs Acute heart failure.   Navigator will sign off at this time.   Earnestine Leys, BSN, Clinical cytogeneticist Only

## 2022-02-01 ENCOUNTER — Telehealth: Payer: Self-pay

## 2022-02-01 LAB — CBC WITH DIFFERENTIAL/PLATELET
Abs Immature Granulocytes: 0.02 10*3/uL (ref 0.00–0.07)
Basophils Absolute: 0 10*3/uL (ref 0.0–0.1)
Basophils Relative: 1 %
Eosinophils Absolute: 0.1 10*3/uL (ref 0.0–0.5)
Eosinophils Relative: 3 %
HCT: 32.3 % — ABNORMAL LOW (ref 39.0–52.0)
Hemoglobin: 10.8 g/dL — ABNORMAL LOW (ref 13.0–17.0)
Immature Granulocytes: 0 %
Lymphocytes Relative: 25 %
Lymphs Abs: 1.4 10*3/uL (ref 0.7–4.0)
MCH: 29.7 pg (ref 26.0–34.0)
MCHC: 33.4 g/dL (ref 30.0–36.0)
MCV: 88.7 fL (ref 80.0–100.0)
Monocytes Absolute: 1.4 10*3/uL — ABNORMAL HIGH (ref 0.1–1.0)
Monocytes Relative: 25 %
Neutro Abs: 2.6 10*3/uL (ref 1.7–7.7)
Neutrophils Relative %: 46 %
Platelets: 212 10*3/uL (ref 150–400)
RBC: 3.64 MIL/uL — ABNORMAL LOW (ref 4.22–5.81)
RDW: 12.9 % (ref 11.5–15.5)
WBC: 5.6 10*3/uL (ref 4.0–10.5)
nRBC: 0 % (ref 0.0–0.2)

## 2022-02-01 LAB — COMPREHENSIVE METABOLIC PANEL
ALT: 19 U/L (ref 0–44)
AST: 23 U/L (ref 15–41)
Albumin: 2 g/dL — ABNORMAL LOW (ref 3.5–5.0)
Alkaline Phosphatase: 54 U/L (ref 38–126)
Anion gap: 8 (ref 5–15)
BUN: 40 mg/dL — ABNORMAL HIGH (ref 6–20)
CO2: 24 mmol/L (ref 22–32)
Calcium: 8.2 mg/dL — ABNORMAL LOW (ref 8.9–10.3)
Chloride: 105 mmol/L (ref 98–111)
Creatinine, Ser: 3.79 mg/dL — ABNORMAL HIGH (ref 0.61–1.24)
GFR, Estimated: 18 mL/min — ABNORMAL LOW (ref >60)
Glucose, Bld: 109 mg/dL — ABNORMAL HIGH (ref 70–99)
Potassium: 4 mmol/L (ref 3.5–5.1)
Sodium: 137 mmol/L (ref 135–145)
Total Bilirubin: 0.1 mg/dL — ABNORMAL LOW (ref 0.3–1.2)
Total Protein: 5 g/dL — ABNORMAL LOW (ref 6.5–8.1)

## 2022-02-01 LAB — HEMOGLOBIN A1C
Hgb A1c MFr Bld: 5.9 % — ABNORMAL HIGH (ref 4.8–5.6)
Mean Plasma Glucose: 123 mg/dL

## 2022-02-01 LAB — GLUCOSE, CAPILLARY
Glucose-Capillary: 145 mg/dL — ABNORMAL HIGH (ref 70–99)
Glucose-Capillary: 113 mg/dL — ABNORMAL HIGH (ref 70–99)
Glucose-Capillary: 145 mg/dL — ABNORMAL HIGH (ref 70–99)
Glucose-Capillary: 156 mg/dL — ABNORMAL HIGH (ref 70–99)

## 2022-02-01 LAB — MAGNESIUM: Magnesium: 2 mg/dL (ref 1.7–2.4)

## 2022-02-01 MED ORDER — SODIUM CHLORIDE 0.9 % IV SOLN: INTRAVENOUS | Status: DC

## 2022-02-01 MED ORDER — ISOSORBIDE MONONITRATE ER 30 MG PO TB24
30.0000 mg | ORAL_TABLET | Freq: Every day | ORAL | Status: DC
  Administered 2022-02-01 – 2022-02-02 (×2): 30 mg via ORAL
  Filled 2022-02-01 (×2): qty 1

## 2022-02-01 MED ORDER — CARVEDILOL 12.5 MG PO TABS
12.5000 mg | ORAL_TABLET | Freq: Two times a day (BID) | ORAL | Status: DC
  Administered 2022-02-01 – 2022-02-02 (×2): 12.5 mg via ORAL
  Filled 2022-02-01 (×2): qty 1

## 2022-02-01 NOTE — Progress Notes (Signed)
PROGRESS NOTE    Samuel Gardner  MBE:[id number] DOB: [birth year] DOA: 01/29/2022 PCP: Patient, No Pcp Per   Brief Narrative:  51 year old male with history of hypertension, diabetes mellitus type 2, not on any medications for these presented with chest pains along with shortness of breath.  On presentation, blood pressure was 234/102.  EKG showed no acute ischemic changes.  BNP of 758, initial troponin of 92nd troponin of 81.  Chest x-ray without evidence of edema.  He was started on IV Lasix.  Cardiology was consulted.  Lasix held on 01/31/2022 because of rising creatinine.  Assessment & Plan:   Acute unspecified CHF exacerbation -Echo showed EF of 55 to 60% with grade 1 diastolic dysfunction.  Cardiology following.  Strict input and output.  Daily weights.  Fluid restriction.  Continue Coreg. -Negative balance of 1070 cc has admission. -Lasix held on 01/31/2022 because of rising creatinine.  Elevated troponin/chest pain/shortness of breath -Possibly from demand ischemia from above and hypertensive urgency -Echo as above.  Cardiology following.  Troponins did not trend upwards.  Hypertensive urgency -Presented with blood pressure of 234/102.  Does not take any medications at home.  Blood pressure improving.  Continue amlodipine, Coreg.   AKI with possible chronic kidney disease stage IIIa or b (unknown) -Creatinine 2.5 on presentation.  Probably has some sort of chronic kidney disease; last creatinine documented was 1.01 in 2015. -Creatinine worsening to 3.79 today.  Renal ultrasound was negative for hydronephrosis.  Start gentle hydration with normal saline.  Repeat a.m. labs.    Possible anemia of chronic disease -Possibly from kidney disease  Tobacco abuse -Currently on nicotine patch.  Tobacco cessation recommended  Hyperlipidemia -LDL 130.  Cholesterol 208.  He will follow-up with PCP for treatment of the same: Diet and exercise with or without medications  DVT prophylaxis:  Heparin subcutaneous Code Status: Full Family Communication: None at bedside  disposition Plan: Status is: Inpatient Remains inpatient appropriate because: Of severity of illness.  Worsening creatinine.  Need for IV fluids.    Consultants: Cardiology  Procedures: Echo  Antimicrobials: None   Subjective: Patient seen and examined at bedside.  No fever, vomiting, chest pain reported. Objective: Vitals:   01/31/22 2039 02/01/22 0025 02/01/22 0421 02/01/22 0828  BP: 139/70 134/77 (!) 148/78 (!) 150/94  Pulse: 73 69 76 76  Resp: 16 16 16    Temp: 98.8 F (37.1 C) 99.5 F (37.5 C) 98.9 F (37.2 C)   TempSrc: Oral Oral Oral   SpO2: 93% 96% 98%   Weight:   74.4 kg   Height:        Intake/Output Summary (Last 24 hours) at 02/01/2022 0833 Last data filed at 01/31/2022 2130 Gross per 24 hour  Intake 120 ml  Output --  Net 120 ml    Filed Weights   01/30/22 1655 01/30/22 2022 02/01/22 0421  Weight: 74.8 kg 73.7 kg 74.4 kg    Examination:  General: On room air.  No distress ENT/neck: No thyromegaly.  JVD is not elevated  respiratory: Decreased breath sounds at bases bilaterally with some crackles; no wheezing  CVS: S1-S2 heard, rate controlled currently Abdominal: Soft, nontender, slightly distended; no organomegaly, bowel sounds are heard Extremities: Trace lower extremity edema; no cyanosis  CNS: Awake and alert.  No focal neurologic deficit.  Moves extremities Lymph: No obvious lymphadenopathy Skin: No obvious ecchymosis/lesions  psych: Affect, judgment and mood are normal  musculoskeletal: No obvious joint swelling/deformity     Data Reviewed: I have personally  reviewed following labs and imaging studies  CBC: Recent Labs  Lab 01/29/22 1716 01/30/22 0351 02/01/22 0133  WBC 9.6 9.4 5.6  NEUTROABS  --  6.9 2.6  HGB 12.4* 11.2* 10.8*  HCT 37.1* 34.4* 32.3*  MCV 89.8 91.0 88.7  PLT 233 217 350    Basic Metabolic Panel: Recent Labs  Lab  01/29/22 1716 01/30/22 0351 01/30/22 0630 01/31/22 0207 02/01/22 0133  NA 137  --   --  139 137  K 4.7  --   --  4.5 4.0  CL 107  --   --  109 105  CO2 22  --   --  23 24  GLUCOSE 111*  --   --  87 109*  BUN 24*  --   --  29* 40*  CREATININE 2.50* 2.62*  --  3.21* 3.79*  CALCIUM 8.5*  --   --  8.4* 8.2*  MG  --   --  1.8 1.7 2.0  PHOS  --   --  3.7  --   --     GFR: Estimated Creatinine Clearance: 20.8 mL/min (A) (by C-G formula based on SCr of 3.79 mg/dL (H)). Liver Function Tests: Recent Labs  Lab 01/31/22 0207 02/01/22 0133  AST 25 23  ALT 18 19  ALKPHOS 57 54  BILITOT 0.4 0.1*  PROT 4.8* 5.0*  ALBUMIN 2.1* 2.0*    No results for input(s): "LIPASE", "AMYLASE" in the last 168 hours. No results for input(s): "AMMONIA" in the last 168 hours. Coagulation Profile: No results for input(s): "INR", "PROTIME" in the last 168 hours. Cardiac Enzymes: No results for input(s): "CKTOTAL", "CKMB", "CKMBINDEX", "TROPONINI" in the last 168 hours. BNP (last 3 results) No results for input(s): "PROBNP" in the last 8760 hours. HbA1C: Recent Labs    01/30/22 0351  HGBA1C 5.9*   CBG: Recent Labs  Lab 01/31/22 0752 01/31/22 1152 01/31/22 1634 01/31/22 2041 02/01/22 0753  GLUCAP 165* 135* 122* 170* 145*    Lipid Profile: Recent Labs    01/30/22 0351  CHOL 208*  HDL 55  LDLCALC 132*  TRIG 106  CHOLHDL 3.8    Thyroid Function Tests: No results for input(s): "TSH", "T4TOTAL", "FREET4", "T3FREE", "THYROIDAB" in the last 72 hours. Anemia Panel: No results for input(s): "VITAMINB12", "FOLATE", "FERRITIN", "TIBC", "IRON", "RETICCTPCT" in the last 72 hours. Sepsis Labs: No results for input(s): "PROCALCITON", "LATICACIDVEN" in the last 168 hours.  No results found for this or any previous visit (from the past 240 hour(s)).       Radiology Studies: US RENAL  Result Date: 01/31/2022 CLINICAL DATA:  Renal failure EXAM: RENAL / URINARY TRACT ULTRASOUND COMPLETE  COMPARISON:  None available FINDINGS: Right Kidney: Renal measurements: 11.0 x 5.1 x 5.7 cm = volume: 166 mL. Normal cortical thickness. Increased cortical echogenicity. No mass, hydronephrosis, or shadowing calcification. Left Kidney: Renal measurements: 10.8 x 6.3 x 7.1 cm = volume: 252 mL. Normal cortical thickness. Increased cortical echogenicity. No mass, hydronephrosis, or shadowing calcification. Bladder: Appears normal for degree of bladder distention. Other: N/A IMPRESSION: Medical renal disease changes of both kidneys. No evidence of renal mass or hydronephrosis. Electronically Signed   By: Lavonia Dana M.D.   On: 01/31/2022 08:45   ECHOCARDIOGRAM COMPLETE  Result Date: 01/30/2022    ECHOCARDIOGRAM REPORT   Patient Name:   Samuel Gardner Date of Exam: 01/30/2022 Medical Rec #:  [id number]       Height:       67.0 in Accession #:  [id number]      Weight:       165.0 lb Date of Birth:  12-26-1970        BSA:          1.863 m Patient Age:    82 years        BP:           178/102 mmHg Patient Gender: M               HR:           91 bpm. Exam Location:  Inpatient Procedure: 2D Echo, 3D Echo, Cardiac Doppler and Color Doppler Indications:    CHF-Acute Diastolic V77.93  History:        Patient has no prior history of Echocardiogram examinations.                 Signs/Symptoms:Shortness of Breath and Chest Pain; Risk                 Factors:Current Smoker.  Sonographer:    Greer Pickerel Referring Phys: [phone] DEBBY CROSLEY  Sonographer Comments: Image acquisition challenging due to respiratory motion. IMPRESSIONS  1. False tendon in the LV apex of no clinical significance     . Left ventricular ejection fraction, by estimation, is 55 to 60%. The left ventricle has normal function. The left ventricle has no regional wall motion abnormalities. There is severe left ventricular hypertrophy of the infero-lateral segment. Left ventricular diastolic parameters are consistent with Grade I diastolic dysfunction  (impaired relaxation). Elevated left ventricular end-diastolic pressure.  2. Right ventricular systolic function is normal. The right ventricular size is normal. Tricuspid regurgitation signal is inadequate for assessing PA pressure.  3. Left atrial size was mildly dilated.  4. A small pericardial effusion is present. The pericardial effusion is anterior to the right ventricle and localized near the right atrium.  5. The mitral valve is normal in structure. Trivial mitral valve regurgitation. No evidence of mitral stenosis.  6. The aortic valve is tricuspid. Aortic valve regurgitation is not visualized. Aortic valve sclerosis/calcification is present, without any evidence of aortic stenosis.  7. The inferior vena cava is normal in size with greater than 50% respiratory variability, suggesting right atrial pressure of 3 mmHg. FINDINGS  Left Ventricle: False tendon in the LV apex of no clinical significance. Left ventricular ejection fraction, by estimation, is 55 to 60%. The left ventricle has normal function. The left ventricle has no regional wall motion abnormalities. 3D left ventricular ejection fraction analysis performed but not reported based on interpreter judgement due to suboptimal tracking. The left ventricular internal cavity size was normal in size. There is severe left ventricular hypertrophy of the infero-lateral segment. Left ventricular diastolic parameters are consistent with Grade I diastolic dysfunction (impaired relaxation). Elevated left ventricular end-diastolic pressure. Right Ventricle: The right ventricular size is normal. No increase in right ventricular wall thickness. Right ventricular systolic function is normal. Tricuspid regurgitation signal is inadequate for assessing PA pressure. Left Atrium: Left atrial size was mildly dilated. Right Atrium: Right atrial size was normal in size. Pericardium: A small pericardial effusion is present. The pericardial effusion is anterior to the right  ventricle and localized near the right atrium. Mitral Valve: The mitral valve is normal in structure. Trivial mitral valve regurgitation. No evidence of mitral valve stenosis. Tricuspid Valve: The tricuspid valve is normal in structure. Tricuspid valve regurgitation is not demonstrated. No evidence of tricuspid stenosis. Aortic Valve: The aortic valve is tricuspid. Aortic  valve regurgitation is not visualized. Aortic valve sclerosis/calcification is present, without any evidence of aortic stenosis. Pulmonic Valve: The pulmonic valve was normal in structure. Pulmonic valve regurgitation is not visualized. No evidence of pulmonic stenosis. Aorta: The aortic root is normal in size and structure. Venous: The inferior vena cava is normal in size with greater than 50% respiratory variability, suggesting right atrial pressure of 3 mmHg. IAS/Shunts: No atrial level shunt detected by color flow Doppler. Additional Comments: There is pleural effusion in the left lateral region.  LEFT VENTRICLE PLAX 2D LVIDd:         4.60 cm   Diastology LVIDs:         3.40 cm   LV e' medial:    5.77 cm/s LV PW:         1.60 cm   LV E/e' medial:  18.4 LV IVS:        1.20 cm   LV e' lateral:   6.20 cm/s LVOT diam:     1.70 cm   LV E/e' lateral: 17.1 LV SV:         47 LV SV Index:   25 LVOT Area:     2.27 cm                           3D Volume EF:                          3D EF:        49 %                          LV EDV:       177 ml                          LV ESV:       91 ml                          LV SV:        87 ml RIGHT VENTRICLE RV S prime:     21.40 cm/s TAPSE (M-mode): 3.1 cm LEFT ATRIUM             Index        RIGHT ATRIUM           Index LA diam:        3.70 cm 1.99 cm/m   RA Area:     18.30 cm LA Vol (A2C):   83.8 ml 44.97 ml/m  RA Volume:   47.60 ml  25.54 ml/m LA Vol (A4C):   57.3 ml 30.75 ml/m LA Biplane Vol: 72.6 ml 38.96 ml/m  AORTIC VALVE LVOT Vmax:   137.00 cm/s LVOT Vmean:  89.400 cm/s LVOT VTI:    0.209 m  AORTA  Ao Root diam: 3.50 cm Ao Asc diam:  2.70 cm MITRAL VALVE MV Area (PHT): 4.06 cm     SHUNTS MV Decel Time: 187 msec     Systemic VTI:  0.21 m MV E velocity: 106.00 cm/s  Systemic Diam: 1.70 cm MV A velocity: 145.00 cm/s MV E/A ratio:  0.73 Fransico Him MD Electronically signed by Fransico Him MD Signature Date/Time: 01/30/2022/5:46:32 PM    Final         Scheduled Meds:  amLODipine  10 mg Oral  Daily   carvedilol  6.25 mg Oral BID WC   heparin  5,000 Units Subcutaneous Q8H   insulin aspart  0-15 Units Subcutaneous TID WC   insulin aspart  0-5 Units Subcutaneous QHS   ipratropium-albuterol  3 mL Nebulization Q6H   nicotine  14 mg Transdermal Daily   sodium chloride flush  3 mL Intravenous Q12H   Continuous Infusions:  sodium chloride            Aline August, MD [facility] 02/01/2022, 8:33 AM

## 2022-02-01 NOTE — Progress Notes (Signed)
Mobility Specialist - Progress Note   02/01/22 1545  Mobility  Activity Ambulated independently in hallway  Level of Assistance Independent  Assistive Device Other (Comment) (iv pole)  Distance Ambulated (ft) 300 ft  Activity Response Tolerated well  Mobility Referral Yes  $Mobility charge 1 Mobility   Pt received in hall and agreeable. No complaint throughout. Pt ws returned to room with all needs met.  Franki Monte  Mobility Specialist Please contact via Solicitor or Rehab office at [phone]

## 2022-02-01 NOTE — Progress Notes (Addendum)
Rounding Note    Patient Name: Samuel Gardner Date of Encounter: 02/01/2022  [facility] Cardiologist: Fransico Him, MD   Subjective   Pt states he feels better and wants to leave the hospital - states he will not be in here for Thanksgiving.  Inpatient Medications    Scheduled Meds:  amLODipine  10 mg Oral Daily   carvedilol  12.5 mg Oral BID WC   heparin  5,000 Units Subcutaneous Q8H   insulin aspart  0-15 Units Subcutaneous TID WC   insulin aspart  0-5 Units Subcutaneous QHS   isosorbide mononitrate  30 mg Oral Daily   nicotine  14 mg Transdermal Daily   sodium chloride flush  3 mL Intravenous Q12H   Continuous Infusions:  sodium chloride     PRN Meds: sodium chloride, acetaminophen, albuterol, hydrALAZINE, ondansetron (ZOFRAN) IV, sodium chloride flush   Vital Signs    Vitals:   01/31/22 2039 02/01/22 0025 02/01/22 0421 02/01/22 0828  BP: 139/70 134/77 (!) 148/78 (!) 150/94  Pulse: 73 69 76 76  Resp: 16 16 16    Temp: 98.8 F (37.1 C) 99.5 F (37.5 C) 98.9 F (37.2 C)   TempSrc: Oral Oral Oral   SpO2: 93% 96% 98%   Weight:   74.4 kg   Height:        Intake/Output Summary (Last 24 hours) at 02/01/2022 0833 Last data filed at 01/31/2022 2130 Gross per 24 hour  Intake 120 ml  Output --  Net 120 ml      02/01/2022    4:21 AM 01/30/2022    8:22 PM 01/30/2022    4:55 PM  Last 3 Weights  Weight (lbs) 164 lb 1.6 oz 162 lb 6.4 oz 164 lb 14.5 oz  Weight (kg) 74.435 kg 73.664 kg 74.8 kg      Telemetry    Sinus rhythm HR in the 60-80s, sinus arrhythmia, PACs (no Afib) - Personally Reviewed  ECG    No new tracings - Personally Reviewed  Physical Exam   GEN: No acute distress.   Neck: No JVD Cardiac: RRR, no murmurs, rubs, or gallops.  Respiratory: wheezing throughout GI: Soft, nontender, non-distended  MS: No edema; No deformity. Neuro:  Nonfocal  Psych: Normal affect   Labs    High Sensitivity Troponin:   Recent Labs  Lab  01/30/22 0003 01/30/22 0351  TROPONINIHS 90* 81*     Chemistry Recent Labs  Lab 01/29/22 1716 01/30/22 0351 01/30/22 0630 01/31/22 0207 02/01/22 0133  NA 137  --   --  139 137  K 4.7  --   --  4.5 4.0  CL 107  --   --  109 105  CO2 22  --   --  23 24  GLUCOSE 111*  --   --  87 109*  BUN 24*  --   --  29* 40*  CREATININE 2.50* 2.62*  --  3.21* 3.79*  CALCIUM 8.5*  --   --  8.4* 8.2*  MG  --   --  1.8 1.7 2.0  PROT  --   --   --  4.8* 5.0*  ALBUMIN  --   --   --  2.1* 2.0*  AST  --   --   --  25 23  ALT  --   --   --  18 19  ALKPHOS  --   --   --  57 54  BILITOT  --   --   --  0.4 0.1*  GFRNONAA 30* 29*  --  22* 18*  ANIONGAP 8  --   --  7 8    Lipids  Recent Labs  Lab 01/30/22 0351  CHOL 208*  TRIG 106  HDL 55  LDLCALC 132*  CHOLHDL 3.8    Hematology Recent Labs  Lab 01/29/22 1716 01/30/22 0351 02/01/22 0133  WBC 9.6 9.4 5.6  RBC 4.13* 3.78* 3.64*  HGB 12.4* 11.2* 10.8*  HCT 37.1* 34.4* 32.3*  MCV 89.8 91.0 88.7  MCH 30.0 29.6 29.7  MCHC 33.4 32.6 33.4  RDW 12.9 13.1 12.9  PLT 233 217 212   Thyroid No results for input(s): "TSH", "FREET4" in the last 168 hours.  BNP Recent Labs  Lab 01/30/22 0003  BNP 758.7*    DDimer No results for input(s): "DDIMER" in the last 168 hours.   Radiology    US RENAL  Result Date: 01/31/2022 CLINICAL DATA:  Renal failure EXAM: RENAL / URINARY TRACT ULTRASOUND COMPLETE COMPARISON:  None available FINDINGS: Right Kidney: Renal measurements: 11.0 x 5.1 x 5.7 cm = volume: 166 mL. Normal cortical thickness. Increased cortical echogenicity. No mass, hydronephrosis, or shadowing calcification. Left Kidney: Renal measurements: 10.8 x 6.3 x 7.1 cm = volume: 252 mL. Normal cortical thickness. Increased cortical echogenicity. No mass, hydronephrosis, or shadowing calcification. Bladder: Appears normal for degree of bladder distention. Other: N/A IMPRESSION: Medical renal disease changes of both kidneys. No evidence of renal  mass or hydronephrosis. Electronically Signed   By: Lavonia Dana M.D.   On: 01/31/2022 08:45   ECHOCARDIOGRAM COMPLETE  Result Date: 01/30/2022    ECHOCARDIOGRAM REPORT   Patient Name:   Samuel Gardner Date of Exam: 01/30/2022 Medical Rec #:  [id number]       Height:       67.0 in Accession #:    [id number]      Weight:       165.0 lb Date of Birth:  [birth year]        BSA:          1.863 m Patient Age:    51 years        BP:           178/102 mmHg Patient Gender: M               HR:           91 bpm. Exam Location:  Inpatient Procedure: 2D Echo, 3D Echo, Cardiac Doppler and Color Doppler Indications:    CHF-Acute Diastolic K16.01  History:        Patient has no prior history of Echocardiogram examinations.                 Signs/Symptoms:Shortness of Breath and Chest Pain; Risk                 Factors:Current Smoker.  Sonographer:    Greer Pickerel Referring Phys: [phone] DEBBY CROSLEY  Sonographer Comments: Image acquisition challenging due to respiratory motion. IMPRESSIONS  1. False tendon in the LV apex of no clinical significance     . Left ventricular ejection fraction, by estimation, is 55 to 60%. The left ventricle has normal function. The left ventricle has no regional wall motion abnormalities. There is severe left ventricular hypertrophy of the infero-lateral segment. Left ventricular diastolic parameters are consistent with Grade I diastolic dysfunction (impaired relaxation). Elevated left ventricular end-diastolic pressure.  2. Right ventricular systolic function is normal. The right ventricular size is normal. Tricuspid  regurgitation signal is inadequate for assessing PA pressure.  3. Left atrial size was mildly dilated.  4. A small pericardial effusion is present. The pericardial effusion is anterior to the right ventricle and localized near the right atrium.  5. The mitral valve is normal in structure. Trivial mitral valve regurgitation. No evidence of mitral stenosis.  6. The aortic valve is tricuspid.  Aortic valve regurgitation is not visualized. Aortic valve sclerosis/calcification is present, without any evidence of aortic stenosis.  7. The inferior vena cava is normal in size with greater than 50% respiratory variability, suggesting right atrial pressure of 3 mmHg. FINDINGS  Left Ventricle: False tendon in the LV apex of no clinical significance. Left ventricular ejection fraction, by estimation, is 55 to 60%. The left ventricle has normal function. The left ventricle has no regional wall motion abnormalities. 3D left ventricular ejection fraction analysis performed but not reported based on interpreter judgement due to suboptimal tracking. The left ventricular internal cavity size was normal in size. There is severe left ventricular hypertrophy of the infero-lateral segment. Left ventricular diastolic parameters are consistent with Grade I diastolic dysfunction (impaired relaxation). Elevated left ventricular end-diastolic pressure. Right Ventricle: The right ventricular size is normal. No increase in right ventricular wall thickness. Right ventricular systolic function is normal. Tricuspid regurgitation signal is inadequate for assessing PA pressure. Left Atrium: Left atrial size was mildly dilated. Right Atrium: Right atrial size was normal in size. Pericardium: A small pericardial effusion is present. The pericardial effusion is anterior to the right ventricle and localized near the right atrium. Mitral Valve: The mitral valve is normal in structure. Trivial mitral valve regurgitation. No evidence of mitral valve stenosis. Tricuspid Valve: The tricuspid valve is normal in structure. Tricuspid valve regurgitation is not demonstrated. No evidence of tricuspid stenosis. Aortic Valve: The aortic valve is tricuspid. Aortic valve regurgitation is not visualized. Aortic valve sclerosis/calcification is present, without any evidence of aortic stenosis. Pulmonic Valve: The pulmonic valve was normal in structure.  Pulmonic valve regurgitation is not visualized. No evidence of pulmonic stenosis. Aorta: The aortic root is normal in size and structure. Venous: The inferior vena cava is normal in size with greater than 50% respiratory variability, suggesting right atrial pressure of 3 mmHg. IAS/Shunts: No atrial level shunt detected by color flow Doppler. Additional Comments: There is pleural effusion in the left lateral region.  LEFT VENTRICLE PLAX 2D LVIDd:         4.60 cm   Diastology LVIDs:         3.40 cm   LV e' medial:    5.77 cm/s LV PW:         1.60 cm   LV E/e' medial:  18.4 LV IVS:        1.20 cm   LV e' lateral:   6.20 cm/s LVOT diam:     1.70 cm   LV E/e' lateral: 17.1 LV SV:         47 LV SV Index:   25 LVOT Area:     2.27 cm                           3D Volume EF:                          3D EF:        49 %  LV EDV:       177 ml                          LV ESV:       91 ml                          LV SV:        87 ml RIGHT VENTRICLE RV S prime:     21.40 cm/s TAPSE (M-mode): 3.1 cm LEFT ATRIUM             Index        RIGHT ATRIUM           Index LA diam:        3.70 cm 1.99 cm/m   RA Area:     18.30 cm LA Vol (A2C):   83.8 ml 44.97 ml/m  RA Volume:   47.60 ml  25.54 ml/m LA Vol (A4C):   57.3 ml 30.75 ml/m LA Biplane Vol: 72.6 ml 38.96 ml/m  AORTIC VALVE LVOT Vmax:   137.00 cm/s LVOT Vmean:  89.400 cm/s LVOT VTI:    0.209 m  AORTA Ao Root diam: 3.50 cm Ao Asc diam:  2.70 cm MITRAL VALVE MV Area (PHT): 4.06 cm     SHUNTS MV Decel Time: 187 msec     Systemic VTI:  0.21 m MV E velocity: 106.00 cm/s  Systemic Diam: 1.70 cm MV A velocity: 145.00 cm/s MV E/A ratio:  0.73 Fransico Him MD Electronically signed by Fransico Him MD Signature Date/Time: 01/30/2022/5:46:32 PM    Final     Cardiac Studies   Echo 01/30/22: 1. False tendon in the LV apex of no clinical significance     Left ventricular ejection fraction, by estimation, is 55 to 60%. The  left ventricle has normal function.  The left ventricle has no regional  wall motion abnormalities. There is severe left ventricular hypertrophy of  the infero-lateral segment. Left  ventricular diastolic parameters are consistent with Grade I diastolic  dysfunction (impaired relaxation). Elevated left ventricular end-diastolic  pressure.   2. Right ventricular systolic function is normal. The right ventricular  size is normal. Tricuspid regurgitation signal is inadequate for assessing  PA pressure.   3. Left atrial size was mildly dilated.   4. A small pericardial effusion is present. The pericardial effusion is  anterior to the right ventricle and localized near the right atrium.   5. The mitral valve is normal in structure. Trivial mitral valve  regurgitation. No evidence of mitral stenosis.   6. The aortic valve is tricuspid. Aortic valve regurgitation is not  visualized. Aortic valve sclerosis/calcification is present, without any  evidence of aortic stenosis.   7. The inferior vena cava is normal in size with greater than 50%  respiratory variability, suggesting right atrial pressure of 3 mmHg.   Patient Profile     51 y.o. male with a hx of HTN, Type 2 DM, tobacco use and medication noncompliance who is being seen for the evaluation of CHF and HTN.   Assessment & Plan    History of uncontrolled hypertension Hypertensive urgency Presenting BP was 234/102.  Echo this admission with preserved EF, mild DD, and severe LVH. He is managed with 10 mg amlodipine. I will increase coreg to 12.5 mg BID - BP was significantly elevated overnight. Will initiate 30 mg imdur - will avoid hydralazine and other  TID dosed medications for compliance - not a candidate for ACEI/ARB/ARNI/MRA given renal function - will likely need a diuretic long term such as chlorthalidone - Can consider renal ultrasound - would be a good candidate for the advanced hypertension clinic after he presents for clinic follow up outpatien   Severe LVH,  grade 1 DD Hypertensive heart disease LVH likely due to longstanding uncontrolled hypertension BNP 760, albumin 2.1 Diuresis has been held for worsening renal function   Acute on chronic kidney disease Creatinine continues to rise, now 3.79 with K4.0 Suspect a component of CKD given his longstanding hypertension and diabetes that have been uncontrolled May need to involve nephrology at some point - ?OP clinic Holding diuretic and ACEI/ARB as above   Shortness of breath, chest pain Elevated troponin HST 90 --> 81 Troponin trend inconsistent with ACS Suspect a component of demand ischemia in the setting of hypertensive urgency Echo with preserved EF and no RWMA He reports no further chest pain or shortness of breath this admission If he has return of symptoms consider nuclear stress test outpatient, likely will not be a candidate for CT coronary given his renal function   DM with hyperglycemia A1c 5.9% May benefit from SGLT2 inhibitor from cardiac standpoint - CrCl 20.8   Medication noncompliance Substance abuse Tobacco abuse/current smoker Suspect likely COPD, wheezing on exam UDS positive for cocaine this admission-doing okay on carvedilol Stressed the importance of controlling BP and DM.  He reports that he can take medications twice daily.  He is not working right now. Stressed low-sodium diet and abstinence from cocaine.  Appreciate case management help with setting up primary care provider.   I have arranged cardiology follow up.      For questions or updates, please contact [place] Please consult [email or web address] for contact info under        Signed, Ledora Bottcher, PA  02/01/2022, 8:33 AM    I have seen and examined the patient along with Ledora Bottcher, PA.  I have reviewed the chart, notes and new data.  I agree with PA/NP's note.  Key new complaints: Denies any cardiovascular complaints. Key examination changes: Blood pressure for the  most part better controlled, not yet ideal, but definitely in a safer range. Key new findings / data: Unfortunately, creatinine continues to slowly worsen.  He is not oliguric.  PLAN: No additional changes in medications planned at this point.  I think if his renal function stabilizes shows improvement he will be ready to go home.  Please note that we have no baseline renal parameters to compare to since 2018.  It is possible that in addition to acute kidney injury from his current presentation he may have developed some degree of chronic kidney disease. Again reviewed the importance of sodium restricted diet, daily weight monitoring, compliance with medications, risk of rebound with abrupt interruption of beta-blockers, risk of severe complications and even death with use of stimulants such as cocaine, and symptoms of heart failure exacerbation, etc.  Sanda Klein, MD, [place] [phone] 02/01/2022, 1:29 PM

## 2022-02-01 NOTE — Telephone Encounter (Signed)
**Note De-identified Threasa Kinch Obfuscation** -----  **Note De-Identified Zain Lankford Obfuscation** Message from Ledora Bottcher, [place] sent at 02/01/2022  9:00 AM EST ----- Pt will need a TOC phone call - discharging in the next 48 hrs.  Thanks Angie

## 2022-02-02 ENCOUNTER — Other Ambulatory Visit (HOSPITAL_COMMUNITY): Payer: Self-pay

## 2022-02-02 LAB — BASIC METABOLIC PANEL
Anion gap: 6 (ref 5–15)
BUN: 38 mg/dL — ABNORMAL HIGH (ref 6–20)
CO2: 21 mmol/L — ABNORMAL LOW (ref 22–32)
Calcium: 7.9 mg/dL — ABNORMAL LOW (ref 8.9–10.3)
Chloride: 109 mmol/L (ref 98–111)
Creatinine, Ser: 3.23 mg/dL — ABNORMAL HIGH (ref 0.61–1.24)
GFR, Estimated: 22 mL/min — ABNORMAL LOW (ref >60)
Glucose, Bld: 116 mg/dL — ABNORMAL HIGH (ref 70–99)
Potassium: 4.3 mmol/L (ref 3.5–5.1)
Sodium: 136 mmol/L (ref 135–145)

## 2022-02-02 LAB — MAGNESIUM: Magnesium: 2.1 mg/dL (ref 1.7–2.4)

## 2022-02-02 LAB — GLUCOSE, CAPILLARY: Glucose-Capillary: 128 mg/dL — ABNORMAL HIGH (ref 70–99)

## 2022-02-02 MED ORDER — AMLODIPINE BESYLATE 10 MG PO TABS
10.0000 mg | ORAL_TABLET | Freq: Every day | ORAL | 0 refills | Status: DC
  Filled 2022-02-02: qty 30, 30d supply, fill #0

## 2022-02-02 MED ORDER — CARVEDILOL 12.5 MG PO TABS: 12.5000 mg | ORAL_TABLET | Freq: Two times a day (BID) | ORAL | 0 refills | Status: DC

## 2022-02-02 MED ORDER — ISOSORBIDE MONONITRATE ER 30 MG PO TB24: 30.0000 mg | ORAL_TABLET | Freq: Every day | ORAL | 0 refills | Status: DC

## 2022-02-02 MED ORDER — ALBUTEROL SULFATE HFA 108 (90 BASE) MCG/ACT IN AERS: 2.0000 | INHALATION_SPRAY | Freq: Four times a day (QID) | RESPIRATORY_TRACT | 2 refills | Status: DC | PRN

## 2022-02-02 MED ORDER — CARVEDILOL 12.5 MG PO TABS
12.5000 mg | ORAL_TABLET | Freq: Two times a day (BID) | ORAL | 0 refills | Status: DC
  Filled 2022-02-02: qty 60, 30d supply, fill #0

## 2022-02-02 MED ORDER — ISOSORBIDE MONONITRATE ER 30 MG PO TB24
30.0000 mg | ORAL_TABLET | Freq: Every day | ORAL | 0 refills | Status: DC
  Filled 2022-02-02: qty 30, 30d supply, fill #0

## 2022-02-02 MED ORDER — ALBUTEROL SULFATE HFA 108 (90 BASE) MCG/ACT IN AERS
2.0000 | INHALATION_SPRAY | Freq: Four times a day (QID) | RESPIRATORY_TRACT | 2 refills | Status: AC | PRN
  Filled 2022-02-02: qty 6.7, 25d supply, fill #0
  Filled 2022-09-03: qty 6.7, 25d supply, fill #1

## 2022-02-02 MED ORDER — AMLODIPINE BESYLATE 10 MG PO TABS: 10.0000 mg | ORAL_TABLET | Freq: Every day | ORAL | 0 refills | Status: DC

## 2022-02-02 NOTE — Discharge Summary (Signed)
Physician Discharge Summary  Samuel Gardner AOZ:[id number] DOB: 04-20-1970 DOA: 01/29/2022  PCP: Patient, No Pcp Per  Admit date: 01/29/2022 Discharge date: 02/02/2022  Admitted From: home Disposition:  home  Recommendations for Outpatient Follow-up:  Follow up with cardiology as scheduled Please obtain BMP/CBC in one week  Home Health: none Equipment/Devices: none  Discharge Condition: stable CODE STATUS: Full code Diet Orders (From admission, onward)     Start     Ordered   02/01/22 1528  Diet Heart Room service appropriate? Yes; Fluid consistency: Thin  Diet effective now       Question Answer Comment  Room service appropriate? Yes   Fluid consistency: Thin      02/01/22 1527            HPI: Per admitting MD, This is a 51 year old male with past medical history of hypertension for 5 years, diabetes mellitus 8 years.  He has never treated either.  3 days ago he developed shortness of breath and sweating.  His legs also started swelling.  Earlier today while doing chores he developed central located chest pains, shortness of breath and again became diaphoretic.  He attributes the chest pain to dry hacking cough he has developed in the past 3 days.  He states his chest is sore after coughing.  His chest pain is not affected by movement he denies heart palpitation.  He he vaguely endorsed some nausea.  At its worst the pain was 6/10, currently 1/10.  Patient has received no nitro in the ER.  He denies fever, chills, lightheadedness or diarrhea. On presenting to the ER patient's blood pressure was 234/102, EKG without acute ischemic changes.  BNP 758, which initial troponin 90, second troponin 81.  Chest x-ray without evidence of edema.  Hospital Course / Discharge diagnoses: Principal Problem:   Hypertensive urgency Active Problems:   Acute congestive heart failure (HCC)   CKD (chronic kidney disease), stage III (HCC)   AKI (acute kidney injury) ([place])   Elevated  troponin   Acute diastolic heart failure (HCC)  Principal problem Acute on chronic diastolic CHF exacerbation -Echo showed EF of 55 to 60% with grade 1 diastolic dysfunction.  Cardiology consulted and followed patient while hospitalized. He was initially placed on diuretics however later held Gardner to rise in creatinine. Overall he has improved, feeling much better, will be discharged home in stable condition with outpatient follow up  Active problems  Elevated troponin/chest pain/shortness of breath -Possibly from demand ischemia from above and hypertensive urgency Hypertensive urgency -Presented with blood pressure of 234/102.  Does not take any medications at home.  Blood pressure improving.  Continue amlodipine, Coreg.  AKI with possible chronic kidney disease stage IIIa or b (unknown) -Creatinine 2.5 on presentation.  Probably has some sort of chronic kidney disease; last creatinine documented was 1.01 in 2015. Creatinine worsening to 3.8 with diuretics, later held and now renal function stabilizing. Will need outpatient follow up and potentially nephrology referral. Possible anemia of chronic disease -Possibly from kidney disease Tobacco abuse - determined to quit Hyperlipidemia -LDL 130.  Cholesterol 208.  He will follow-up with PCP for treatment of the same: Diet and exercise with or without medications  Sepsis ruled out   Discharge Instructions   Allergies as of 02/02/2022   No Known Allergies      Medication List     STOP taking these medications    cyclobenzaprine 10 MG tablet Commonly known as: FLEXERIL   HYDROcodone-acetaminophen 5-325 MG tablet  Commonly known as: Norco   ibuprofen 600 MG tablet Commonly known as: ADVIL   naproxen 375 MG tablet Commonly known as: NAPROSYN   traMADol 50 MG tablet Commonly known as: ULTRAM       TAKE these medications    amLODipine 10 MG tablet Commonly known as: NORVASC Take 1 tablet (10 mg total) by mouth daily.    carvedilol 12.5 MG tablet Commonly known as: COREG Take 1 tablet (12.5 mg total) by mouth 2 (two) times daily with a meal.   isosorbide mononitrate 30 MG 24 hr tablet Commonly known as: IMDUR Take 1 tablet (30 mg total) by mouth daily.       Consultations: Cardiology   Procedures/Studies:  US RENAL  Result Date: 02/09/2022 CLINICAL DATA:  Renal failure EXAM: RENAL / URINARY TRACT ULTRASOUND COMPLETE COMPARISON:  None available FINDINGS: Right Kidney: Renal measurements: 11.0 x 5.1 x 5.7 cm = volume: 166 mL. Normal cortical thickness. Increased cortical echogenicity. No mass, hydronephrosis, or shadowing calcification. Left Kidney: Renal measurements: 10.8 x 6.3 x 7.1 cm = volume: 252 mL. Normal cortical thickness. Increased cortical echogenicity. No mass, hydronephrosis, or shadowing calcification. Bladder: Appears normal for degree of bladder distention. Other: N/A IMPRESSION: Medical renal disease changes of both kidneys. No evidence of renal mass or hydronephrosis. Electronically Signed   By: Lavonia Dana M.D.   On: 02/09/2022 08:45   ECHOCARDIOGRAM COMPLETE  Result Date: 01/30/2022    ECHOCARDIOGRAM REPORT   Patient Name:   Samuel Gardner Date of Exam: 01/30/2022 Medical Rec #:  [id number]       Height:       67.0 in Accession #:    [id number]      Weight:       165.0 lb Date of Birth:  [birth year]        BSA:          1.863 m Patient Age:    30 years        BP:           178/102 mmHg Patient Gender: M               HR:           91 bpm. Exam Location:  Inpatient Procedure: 2D Echo, 3D Echo, Cardiac Doppler and Color Doppler Indications:    CHF-Acute Diastolic U88.91  History:        Patient has no prior history of Echocardiogram examinations.                 Signs/Symptoms:Shortness of Breath and Chest Pain; Risk                 Factors:Current Smoker.  Sonographer:    Greer Pickerel Referring Phys: [phone] DEBBY CROSLEY  Sonographer Comments: Image acquisition challenging Gardner to respiratory  motion. IMPRESSIONS  1. False tendon in the LV apex of no clinical significance     . Left ventricular ejection fraction, by estimation, is 55 to 60%. The left ventricle has normal function. The left ventricle has no regional wall motion abnormalities. There is severe left ventricular hypertrophy of the infero-lateral segment. Left ventricular diastolic parameters are consistent with Grade I diastolic dysfunction (impaired relaxation). Elevated left ventricular end-diastolic pressure.  2. Right ventricular systolic function is normal. The right ventricular size is normal. Tricuspid regurgitation signal is inadequate for assessing PA pressure.  3. Left atrial size was mildly dilated.  4. A small pericardial effusion is present. The pericardial effusion is anterior  to the right ventricle and localized near the right atrium.  5. The mitral valve is normal in structure. Trivial mitral valve regurgitation. No evidence of mitral stenosis.  6. The aortic valve is tricuspid. Aortic valve regurgitation is not visualized. Aortic valve sclerosis/calcification is present, without any evidence of aortic stenosis.  7. The inferior vena cava is normal in size with greater than 50% respiratory variability, suggesting right atrial pressure of 3 mmHg. FINDINGS  Left Ventricle: False tendon in the LV apex of no clinical significance. Left ventricular ejection fraction, by estimation, is 55 to 60%. The left ventricle has normal function. The left ventricle has no regional wall motion abnormalities. 3D left ventricular ejection fraction analysis performed but not reported based on interpreter judgement Gardner to suboptimal tracking. The left ventricular internal cavity size was normal in size. There is severe left ventricular hypertrophy of the infero-lateral segment. Left ventricular diastolic parameters are consistent with Grade I diastolic dysfunction (impaired relaxation). Elevated left ventricular end-diastolic pressure. Right  Ventricle: The right ventricular size is normal. No increase in right ventricular wall thickness. Right ventricular systolic function is normal. Tricuspid regurgitation signal is inadequate for assessing PA pressure. Left Atrium: Left atrial size was mildly dilated. Right Atrium: Right atrial size was normal in size. Pericardium: A small pericardial effusion is present. The pericardial effusion is anterior to the right ventricle and localized near the right atrium. Mitral Valve: The mitral valve is normal in structure. Trivial mitral valve regurgitation. No evidence of mitral valve stenosis. Tricuspid Valve: The tricuspid valve is normal in structure. Tricuspid valve regurgitation is not demonstrated. No evidence of tricuspid stenosis. Aortic Valve: The aortic valve is tricuspid. Aortic valve regurgitation is not visualized. Aortic valve sclerosis/calcification is present, without any evidence of aortic stenosis. Pulmonic Valve: The pulmonic valve was normal in structure. Pulmonic valve regurgitation is not visualized. No evidence of pulmonic stenosis. Aorta: The aortic root is normal in size and structure. Venous: The inferior vena cava is normal in size with greater than 50% respiratory variability, suggesting right atrial pressure of 3 mmHg. IAS/Shunts: No atrial level shunt detected by color flow Doppler. Additional Comments: There is pleural effusion in the left lateral region.  LEFT VENTRICLE PLAX 2D LVIDd:         4.60 cm   Diastology LVIDs:         3.40 cm   LV e' medial:    5.77 cm/s LV PW:         1.60 cm   LV E/e' medial:  18.4 LV IVS:        1.20 cm   LV e' lateral:   6.20 cm/s LVOT diam:     1.70 cm   LV E/e' lateral: 17.1 LV SV:         47 LV SV Index:   25 LVOT Area:     2.27 cm                           3D Volume EF:                          3D EF:        49 %                          LV EDV:       177 ml  LV ESV:       91 ml                          LV SV:        87 ml RIGHT  VENTRICLE RV S prime:     21.40 cm/s TAPSE (M-mode): 3.1 cm LEFT ATRIUM             Index        RIGHT ATRIUM           Index LA diam:        3.70 cm 1.99 cm/m   RA Area:     18.30 cm LA Vol (A2C):   83.8 ml 44.97 ml/m  RA Volume:   47.60 ml  25.54 ml/m LA Vol (A4C):   57.3 ml 30.75 ml/m LA Biplane Vol: 72.6 ml 38.96 ml/m  AORTIC VALVE LVOT Vmax:   137.00 cm/s LVOT Vmean:  89.400 cm/s LVOT VTI:    0.209 m  AORTA Ao Root diam: 3.50 cm Ao Asc diam:  2.70 cm MITRAL VALVE MV Area (PHT): 4.06 cm     SHUNTS MV Decel Time: 187 msec     Systemic VTI:  0.21 m MV E velocity: 106.00 cm/s  Systemic Diam: 1.70 cm MV A velocity: 145.00 cm/s MV E/A ratio:  0.73 Fransico Him MD Electronically signed by Fransico Him MD Signature Date/Time: 01/30/2022/5:46:32 PM    Final    DG Chest 2 View  Result Date: 01/29/2022 CLINICAL DATA:  Shortness of breath.  Cough. EXAM: CHEST - 2 VIEW COMPARISON:  February 05, 2008 FINDINGS: Mild cardiomegaly. The hila and mediastinum are unremarkable. No pneumothorax. No nodules or masses. No focal infiltrates. IMPRESSION: No active cardiopulmonary disease. Electronically Signed   By: Dorise Bullion III M.D.   On: 01/29/2022 17:53     Subjective: - no chest pain, shortness of breath, no abdominal pain, nausea or vomiting.   Discharge Exam: BP (!) 164/84 (BP Location: Right Arm)   Pulse 69   Temp 98.6 F (37 C) (Oral)   Resp 13   Ht 5\' 6"  (1.676 m)   Wt 75.8 kg   SpO2 98%   BMI 26.97 kg/m   General: Pt is alert, awake, not in acute distress Cardiovascular: RRR, S1/S2 +, no rubs, no gallops Respiratory: CTA bilaterally, no wheezing, no rhonchi Abdominal: Soft, NT, ND, bowel sounds + Extremities: no edema, no cyanosis    The results of significant diagnostics from this hospitalization (including imaging, microbiology, ancillary and laboratory) are listed below for reference.     Microbiology: No results found for this or any previous visit (from the past 240  hour(s)).   Labs: Basic Metabolic Panel: Recent Labs  Lab 01/29/22 1716 01/30/22 0351 01/30/22 0630 01/31/22 0207 02/01/22 0133 02/02/22 0308  NA 137  --   --  139 137 136  K 4.7  --   --  4.5 4.0 4.3  CL 107  --   --  109 105 109  CO2 22  --   --  23 24 21*  GLUCOSE 111*  --   --  87 109* 116*  BUN 24*  --   --  29* 40* 38*  CREATININE 2.50* 2.62*  --  3.21* 3.79* 3.23*  CALCIUM 8.5*  --   --  8.4* 8.2* 7.9*  MG  --   --  1.8 1.7 2.0 2.1  PHOS  --   --  3.7  --   --   --    Liver Function Tests: Recent Labs  Lab 01/31/22 0207 02/01/22 0133  AST 25 23  ALT 18 19  ALKPHOS 57 54  BILITOT 0.4 0.1*  PROT 4.8* 5.0*  ALBUMIN 2.1* 2.0*   CBC: Recent Labs  Lab 01/29/22 1716 01/30/22 0351 02/01/22 0133  WBC 9.6 9.4 5.6  NEUTROABS  --  6.9 2.6  HGB 12.4* 11.2* 10.8*  HCT 37.1* 34.4* 32.3*  MCV 89.8 91.0 88.7  PLT 233 217 212   CBG: Recent Labs  Lab 02/01/22 0753 02/01/22 1202 02/01/22 1621 02/01/22 2126 02/02/22 0828  GLUCAP 145* 113* 145* 156* 128*   Hgb A1c No results for input(s): "HGBA1C" in the last 72 hours. Lipid Profile No results for input(s): "CHOL", "HDL", "LDLCALC", "TRIG", "CHOLHDL", "LDLDIRECT" in the last 72 hours. Thyroid function studies No results for input(s): "TSH", "T4TOTAL", "T3FREE", "THYROIDAB" in the last 72 hours.  Invalid input(s): "FREET3" Urinalysis    Component Value Date/Time   COLORURINE YELLOW 10/07/2010 1830   APPEARANCEUR CLEAR 10/07/2010 1830   LABSPEC 1.039 (H) 10/07/2010 1830   PHURINE 5.5 10/07/2010 1830   GLUCOSEU >1000 (A) 10/07/2010 1830   HGBUR NEGATIVE 10/07/2010 1830   BILIRUBINUR NEGATIVE 10/07/2010 1830   KETONESUR >80 (A) 10/07/2010 1830   PROTEINUR NEGATIVE 10/07/2010 1830   UROBILINOGEN 0.2 10/07/2010 1830   NITRITE NEGATIVE 10/07/2010 1830   LEUKOCYTESUR NEGATIVE 10/07/2010 1830    FURTHER DISCHARGE INSTRUCTIONS:   Get Medicines reviewed and adjusted: Please take all your medications with  you for your next visit with your Primary MD   Laboratory/radiological data: Please request your Primary MD to go over all hospital tests and procedure/radiological results at the follow up, please ask your Primary MD to get all Hospital records sent to his/her office.   In some cases, they will be blood work, cultures and biopsy results pending at the time of your discharge. Please request that your primary care M.D. goes through all the records of your hospital data and follows up on these results.   Also Note the following: If you experience worsening of your admission symptoms, develop shortness of breath, life threatening emergency, suicidal or homicidal thoughts you must seek medical attention immediately by calling 911 or calling your MD immediately  if symptoms less severe.   You must read complete instructions/literature along with all the possible adverse reactions/side effects for all the Medicines you take and that have been prescribed to you. Take any new Medicines after you have completely understood and accpet all the possible adverse reactions/side effects.    Do not drive when taking Pain medications or sleeping medications (Benzodaizepines)   Do not take more than prescribed Pain, Sleep and Anxiety Medications. It is not advisable to combine anxiety,sleep and pain medications without talking with your primary care practitioner   Special Instructions: If you have smoked or chewed Tobacco  in the last 2 yrs please stop smoking, stop any regular Alcohol  and or any Recreational drug use.   Wear Seat belts while driving.   Please note: You were cared for by a hospitalist during your hospital stay. Once you are discharged, your primary care physician will handle any further medical issues. Please note that NO REFILLS for any discharge medications will be authorized once you are discharged, as it is imperative that you return to your primary care physician (or establish a  relationship with a primary care physician if you do not have  one) for your post hospital discharge needs so that they can reassess your need for medications and monitor your lab values.  Time coordinating discharge: 40 minutes  SIGNED:  Marzetta Board, MD, PhD 02/02/2022, 8:48 AM

## 2022-02-02 NOTE — Care Management (Signed)
02-02-22 1047 MATCH is completed for this patient. Medications will be delivered from [facility]. No further needs identified from Case Manager.

## 2022-02-02 NOTE — Discharge Instructions (Signed)
Follow with cardiology as scheduled  Please get a complete blood count and chemistry panel checked by your Primary MD at your next visit, and again as instructed by your Primary MD. Please get your medications reviewed and adjusted by your Primary MD.  Please request your Primary MD to go over all Hospital Tests and Procedure/Radiological results at the follow up, please get all Hospital records sent to your Prim MD by signing hospital release before you go home.  In some cases, there will be blood work, cultures and biopsy results pending at the time of your discharge. Please request that your primary care M.D. goes through all the records of your hospital data and follows up on these results.  If you had Pneumonia of Lung problems at the Hospital: Please get a 2 view Chest X ray done in 6-8 weeks after hospital discharge or sooner if instructed by your Primary MD.  If you have Congestive Heart Failure: Please call your Cardiologist or Primary MD anytime you have any of the following symptoms:  1) 3 pound weight gain in 24 hours or 5 pounds in 1 week  2) shortness of breath, with or without a dry hacking cough  3) swelling in the hands, feet or stomach  4) if you have to sleep on extra pillows at night in order to breathe  Follow cardiac low salt diet and 1.5 lit/day fluid restriction.  If you have diabetes Accuchecks 4 times/day, Once in AM empty stomach and then before each meal. Log in all results and show them to your primary doctor at your next visit. If any glucose reading is under 80 or above 300 call your primary MD immediately.  If you have Seizure/Convulsions/Epilepsy: Please do not drive, operate heavy machinery, participate in activities at heights or participate in high speed sports until you have seen by Primary MD or a Neurologist and advised to do so again. Per [facility] statutes, patients with seizures are not allowed to drive until they have been seizure-free  for six months.  Use caution when using heavy equipment or power tools. Avoid working on ladders or at heights. Take showers instead of baths. Ensure the water temperature is not too high on the home water heater. Do not go swimming alone. Do not lock yourself in a room alone (i.e. bathroom). When caring for infants or small children, sit down when holding, feeding, or changing them to minimize risk of injury to the child in the event you have a seizure. Maintain good sleep hygiene. Avoid alcohol.   If you had Gastrointestinal Bleeding: Please ask your Primary MD to check a complete blood count within one week of discharge or at your next visit. Your endoscopic/colonoscopic biopsies that are pending at the time of discharge, will also need to followed by your Primary MD.  Get Medicines reviewed and adjusted. Please take all your medications with you for your next visit with your Primary MD  Please request your Primary MD to go over all hospital tests and procedure/radiological results at the follow up, please ask your Primary MD to get all Hospital records sent to his/her office.  If you experience worsening of your admission symptoms, develop shortness of breath, life threatening emergency, suicidal or homicidal thoughts you must seek medical attention immediately by calling 911 or calling your MD immediately  if symptoms less severe.  You must read complete instructions/literature along with all the possible adverse reactions/side effects for all the Medicines you take and that have  been prescribed to you. Take any new Medicines after you have completely understood and accpet all the possible adverse reactions/side effects.   Do not drive or operate heavy machinery when taking Pain medications.   Do not take more than prescribed Pain, Sleep and Anxiety Medications  Special Instructions: If you have smoked or chewed Tobacco  in the last 2 yrs please stop smoking, stop any regular Alcohol  and or  any Recreational drug use.  Wear Seat belts while driving.  Please note You were cared for by a hospitalist during your hospital stay. If you have any questions about your discharge medications or the care you received while you were in the hospital after you are discharged, you can call the unit and asked to speak with the hospitalist on call if the hospitalist that took care of you is not available. Once you are discharged, your primary care physician will handle any further medical issues. Please note that NO REFILLS for any discharge medications will be authorized once you are discharged, as it is imperative that you return to your primary care physician (or establish a relationship with a primary care physician if you do not have one) for your aftercare needs so that they can reassess your need for medications and monitor your lab values.  You can reach the hospitalist office at phone [phone] or fax [phone]   If you do not have a primary care physician, you can call [phone] for a physician referral.  Activity: As tolerated with Full fall precautions use walker/cane & assistance as needed    Diet: low sodium  Disposition Home

## 2022-02-07 ENCOUNTER — Ambulatory Visit: Payer: Self-pay | Attending: Physician Assistant | Admitting: Physician Assistant

## 2022-02-07 ENCOUNTER — Encounter: Payer: Self-pay | Admitting: Physician Assistant

## 2022-02-07 VITALS — BP 160/78 | HR 60 | Ht 67.0 in | Wt 177.8 lb

## 2022-02-07 DIAGNOSIS — I160 Hypertensive urgency: Secondary | ICD-10-CM

## 2022-02-07 DIAGNOSIS — N179 Acute kidney failure, unspecified: Secondary | ICD-10-CM

## 2022-02-07 DIAGNOSIS — R079 Chest pain, unspecified: Secondary | ICD-10-CM

## 2022-02-07 DIAGNOSIS — Z720 Tobacco use: Secondary | ICD-10-CM

## 2022-02-07 DIAGNOSIS — E782 Mixed hyperlipidemia: Secondary | ICD-10-CM

## 2022-02-07 DIAGNOSIS — I5043 Acute on chronic combined systolic (congestive) and diastolic (congestive) heart failure: Secondary | ICD-10-CM

## 2022-02-07 DIAGNOSIS — N183 Chronic kidney disease, stage 3 unspecified: Secondary | ICD-10-CM

## 2022-02-07 MED ORDER — NITROGLYCERIN 0.4 MG SL SUBL: 0.4000 mg | SUBLINGUAL_TABLET | SUBLINGUAL | 3 refills | Status: DC | PRN

## 2022-02-07 MED ORDER — ISOSORBIDE MONONITRATE ER 60 MG PO TB24: 60.0000 mg | ORAL_TABLET | Freq: Every day | ORAL | 3 refills | Status: DC

## 2022-02-07 MED ORDER — LEVALBUTEROL HCL 0.63 MG/3ML IN NEBU: 0.6300 mg | INHALATION_SOLUTION | Freq: Four times a day (QID) | RESPIRATORY_TRACT | 0 refills | Status: DC | PRN

## 2022-02-07 MED ORDER — AMLODIPINE BESYLATE 5 MG PO TABS: 5.0000 mg | ORAL_TABLET | Freq: Every day | ORAL | 3 refills | Status: DC

## 2022-02-07 NOTE — Telephone Encounter (Signed)
**Note De-Identified [place] Obfuscation** Transition Care Management Unsuccessful Follow-up Telephone Call  Date of discharge and from where:  02/02/2022 from [facility]  Attempts:  2nd Attempt  Reason for unsuccessful TCM follow-up call:  Left voice message

## 2022-02-07 NOTE — Progress Notes (Signed)
Office Visit    Patient Name: Samuel Gardner Date of Encounter: 02/07/2022  PCP:  Patient, No Pcp Per   [facility]  Cardiologist:  Fransico Him, MD  Advanced Practice Provider:  No care team member to display Electrophysiologist:  None   HPI    Sabastian Raimondi is a 51 y.o. male with past medical history significant for hypertension, diabetes mellitus, acute congestive heart failure, CKD presents today for hospital follow-up appointment. Underlying COPD.   He was admitted 11/18 through 11/22 and had developed shortness of breath and sweating 3 days prior to admission.  Legs also started swelling.  Earlier that day he was doing chores and developed central localized chest pains, shortness of breath and became diaphoretic.  He attributed the chest pain to a dry hacking cough he had developed the past 3 days.  He stated his chest was sore after coughing.  His chest pains was not affected by movement.  He denied heart palpitations.  He vaguely endorsed some nausea.  At its worst pain was 6 out of 10.  Patient received no nitro in the ER and it was a 1 out of 10.  Denies fever, chills, lightheadedness, or diarrhea.  On presenting to the ER blood pressure was 234/102 and EKG without acute ischemic changes.  Initial troponin 98, second troponin 81.  Chest x-ray without evidence of edema.  An echocardiogram was obtained which showed LVEF 55 to 60% with grade 1 DD.  Cardiology was consulted.  Elevated troponin/chest pain/shortness of breath was possibly thought to be due to demand ischemia and hypertensive urgency.  Today, he comes in today for lab.  His diuretics were discontinued in the hospital due to his bump in creatinine.  Most recent creatinine from 11/22 was 3.23.  Nephrology was not consulted inpatient.  Today, he has 1-2+ pitting edema up to his knees.  He does endorse shortness of breath and orthopnea.  He has used his albuterol inhaler due to his shortness of breath.   He does also endorse wheezing when he is short of breath.  He does have a history of smoking.  Most recent echocardiogram was reviewed with the patient.  Ischemic work-up was not pursued inpatient since his chest pain was thought to be due to to demand ischemia in the setting of hypertension.  Would have a low threshold to pursue ischemic work-up with chest pain continue his after blood pressure is better controlled.  Reports no palpitations.  Past Medical History    Past Medical History:  Diagnosis Date   Diabetes mellitus without complication ([place])    Hypertension    Past Surgical History:  Procedure Laterality Date   LEG SURGERY Right    teenager    Allergies  No Known Allergies  EKGs/Labs/Other Studies Reviewed:   The following studies were reviewed today:  Echocardiogram 01/30/2022  IMPRESSIONS     1. False tendon in the LV apex of no clinical significance      . Left ventricular ejection fraction, by estimation, is 55 to 60%. The  left ventricle has normal function. The left ventricle has no regional  wall motion abnormalities. There is severe left ventricular hypertrophy of  the infero-lateral segment. Left  ventricular diastolic parameters are consistent with Grade I diastolic  dysfunction (impaired relaxation). Elevated left ventricular end-diastolic  pressure.   2. Right ventricular systolic function is normal. The right ventricular  size is normal. Tricuspid regurgitation signal is inadequate for assessing  PA pressure.  3. Left atrial size was mildly dilated.   4. A small pericardial effusion is present. The pericardial effusion is  anterior to the right ventricle and localized near the right atrium.   5. The mitral valve is normal in structure. Trivial mitral valve  regurgitation. No evidence of mitral stenosis.   6. The aortic valve is tricuspid. Aortic valve regurgitation is not  visualized. Aortic valve sclerosis/calcification is present, without any   evidence of aortic stenosis.   7. The inferior vena cava is normal in size with greater than 50%  respiratory variability, suggesting right atrial pressure of 3 mmHg.  EKG:  EKG is not ordered today.    Recent Labs: 01/30/2022: B Natriuretic Peptide 758.7 02/01/2022: ALT 19; Hemoglobin 10.8; Platelets 212 02/02/2022: BUN 38; Creatinine, Ser 3.23; Magnesium 2.1; Potassium 4.3; Sodium 136  Recent Lipid Panel    Component Value Date/Time   CHOL 208 (H) 01/30/2022 0351   TRIG 106 01/30/2022 0351   HDL 55 01/30/2022 0351   CHOLHDL 3.8 01/30/2022 0351   VLDL 21 01/30/2022 0351   LDLCALC 132 (H) 01/30/2022 0351    Home Medications   Current Meds  Medication Sig   albuterol (VENTOLIN HFA) 108 (90 Base) MCG/ACT inhaler Inhale 2 puffs into the lungs every 6 (six) hours as needed for wheezing or shortness of breath.   amLODipine (NORVASC) 5 MG tablet Take 1 tablet (5 mg total) by mouth daily.   carvedilol (COREG) 12.5 MG tablet Take 1 tablet (12.5 mg total) by mouth 2 (two) times daily with a meal.   isosorbide mononitrate (IMDUR) 60 MG 24 hr tablet Take 1 tablet (60 mg total) by mouth daily.   levalbuterol (XOPENEX) 0.63 MG/3ML nebulizer solution Take 3 mLs (0.63 mg total) by nebulization every 6 (six) hours as needed for shortness of breath.   nitroGLYCERIN (NITROSTAT) 0.4 MG SL tablet Place 1 tablet (0.4 mg total) under the tongue every 5 (five) minutes as needed for chest pain.   [DISCONTINUED] amLODipine (NORVASC) 10 MG tablet Take 1 tablet (10 mg total) by mouth daily.   [DISCONTINUED] isosorbide mononitrate (IMDUR) 30 MG 24 hr tablet Take 1 tablet (30 mg total) by mouth daily.     Review of Systems      All other systems reviewed and are otherwise negative except as noted above.  Physical Exam    VS:  BP (!) 160/78   Pulse 60   Ht 5\' 7"  (1.702 m)   Wt 177 lb 12.8 oz (80.6 kg)   SpO2 98%   BMI 27.85 kg/m  , BMI Body mass index is 27.85 kg/m.  Wt Readings from Last 3  Encounters:  02/07/22 177 lb 12.8 oz (80.6 kg)  02/02/22 167 lb 1.6 oz (75.8 kg)  07/17/18 165 lb (74.8 kg)     GEN: Well nourished, well developed, in no acute distress. HEENT: normal. Neck: Supple, no JVD, carotid bruits, or masses. Cardiac: RRR, no murmurs, rubs, or gallops. No clubbing, cyanosis, 1-2 + pitting edema up to the patient's knees.  Radials/PT 2+ and equal bilaterally.  Respiratory:  Respirations regular and unlabored, clear to auscultation bilaterally. GI: Soft, nontender, nondistended. MS: No deformity or atrophy. Skin: Warm and dry, no rash. Neuro:  Strength and sensation are intact. Psych: Normal affect.  Assessment & Plan    Acute on chronic diastolic CHF -fluid overloaded today with 1-2 + pitting edema in lower ext however with creatinine 3.23 they cannot initiate any diuretics today or Farxiga/Jardiance -SOB is  likely multifactorial since he is a smoker and likely has underlying COPD -We have decreased his Norvasc to 5 mg daily due to his lower extremity edema and increased his Imdur to 60 mg daily with further titration likely needed  Elevated troponin/chest pain/shortness of breath -Thought to be due to demand ischemia -I have increased his Imdur today and provided him with a as needed nitroglycerin tabs -If chest pain continues even with better blood pressure control would consider ischemic work-up  Hypertensive urgency -Blood pressure elevated in the office today 168/84, repeat 160/78 -He is having bilateral pitting edema so we will decrease his amlodipine to 5 mg daily -Increase Imdur to 60 mg daily -Please keep track of your blood pressure an hour after morning medicines and send me those values via MyChart or telephone call  Hyperlipidemia -Most recent LDL was 132 -He will need a repeat lipid panel at his next office appointment and to possibly start on a statin for better control -Low-sodium diet reviewed and dietary changes are being made  AKI  with possible chronic kidney disease -He is currently on a fluid restriction -Referral to nephrology in hopes to get him an appointment this week -He is volume overloaded with a creatinine of 3.2, therefore we cannot initiate diuretic therapy  Tobacco abuse -Cessation advised  HYPERTENSION CONTROL Vitals:   02/07/22 1432 02/07/22 1731  BP: (!) 168/84 (!) 160/78    The patient's blood pressure is elevated above target today.  In order to address the patient's elevated BP: A current anti-hypertensive medication was adjusted today.         Disposition: Follow up 1 month with Fransico Him, MD or APP.  Signed, Elgie Collard, [name] 02/07/2022, 5:32 PM [facility]

## 2022-02-07 NOTE — Patient Instructions (Addendum)
Medication Instructions:  1.Decrease amlodipine to 5 mg daily 2.Increase imdur to 60 mg daily 3.Start xopenex nebulizer 3 mLs every 6 hours as needed for shortness of breath 4.Start nitroglycerin 0.4 mg, place one tablet under the tongue every 5 minutes as needed for chest pain *If you need a refill on your cardiac medications before your next appointment, please call your pharmacy*   Lab Work: BMP, BNP and CBC today If you have labs (blood work) drawn today and your tests are completely normal, you will receive your results only by: [place] (if you have MyChart) OR A paper copy in the mail If you have any lab test that is abnormal or we need to change your treatment, we will call you to review the results.  Follow-Up: At [facility], you and your health needs are our priority.  As part of our continuing mission to provide you with exceptional heart care, we have created designated Provider Care Teams.  These Care Teams include your primary Cardiologist (physician) and Advanced Practice Providers (APPs -  Physician Assistants and Nurse Practitioners) who all work together to provide you with the care you need, when you need it.  We recommend signing up for the patient portal called "MyChart".  Sign up information is provided on this After Visit Summary.  MyChart is used to connect with patients for Virtual Visits (Telemedicine).  Patients are able to view lab/test results, encounter notes, upcoming appointments, etc.  Non-urgent messages can be sent to your provider as well.   To learn more about what you can do with MyChart, go to [email or web address].    Your next appointment:   1 month(s)  The format for your next appointment:   In Person  Provider:   Fransico Him, MD    Other Instructions 1.You have been referred to nephrology  2.Check blood pressure daily, one hour after taking morning medications for the next 2 weeks, keep a log and call us with the  readings  Important Information About Sugar

## 2022-02-07 NOTE — Telephone Encounter (Signed)
**Note De-Identified Demonica Farrey Obfuscation** Transition Care Management Unsuccessful Follow-up Telephone Call  Date of discharge and from where:  02/03/19/23 from [facility]  Attempts:  1st Attempt  Reason for unsuccessful TCM follow-up call:  Left voice message I did leave a reminder in my VM message advising the pt that he has a post hospital f/u that is scheduled for today at 2:20 with Nicholes Rough, [name] at [address] in [place]. I also left my name and [facility]'s phone number so he can call us back.

## 2022-02-07 NOTE — Telephone Encounter (Signed)
**Note De-Identified Samuel Gardner Obfuscation** Per Nicholes Rough, [name]'s schedule, the pt is currently at his post hospital f/u with her at this time.

## 2022-02-08 ENCOUNTER — Telehealth: Payer: Self-pay | Admitting: Physician Assistant

## 2022-02-08 ENCOUNTER — Telehealth: Payer: Self-pay | Admitting: Cardiology

## 2022-02-08 DIAGNOSIS — E875 Hyperkalemia: Secondary | ICD-10-CM

## 2022-02-08 DIAGNOSIS — N179 Acute kidney failure, unspecified: Secondary | ICD-10-CM

## 2022-02-08 LAB — BASIC METABOLIC PANEL
BUN/Creatinine Ratio: 13 (ref 9–20)
BUN/Creatinine Ratio: 14 (ref 9–20)
BUN: 36 mg/dL — ABNORMAL HIGH (ref 6–24)
BUN: 35 mg/dL — ABNORMAL HIGH (ref 6–24)
CO2: 19 mmol/L — ABNORMAL LOW (ref 20–29)
CO2: 22 mmol/L (ref 20–29)
Calcium: 8.8 mg/dL (ref 8.7–10.2)
Calcium: 8.6 mg/dL — ABNORMAL LOW (ref 8.7–10.2)
Chloride: 113 mmol/L — ABNORMAL HIGH (ref 96–106)
Chloride: 109 mmol/L — ABNORMAL HIGH (ref 96–106)
Creatinine, Ser: 2.76 mg/dL — ABNORMAL HIGH (ref 0.76–1.27)
Creatinine, Ser: 2.59 mg/dL — ABNORMAL HIGH (ref 0.76–1.27)
Glucose: 88 mg/dL (ref 70–99)
Glucose: 138 mg/dL — ABNORMAL HIGH (ref 70–99)
Potassium: 7.1 mmol/L (ref 3.5–5.2)
Potassium: 6.5 mmol/L (ref 3.5–5.2)
Sodium: 143 mmol/L (ref 134–144)
Sodium: 140 mmol/L (ref 134–144)
eGFR: 27 mL/min/{1.73_m2} — ABNORMAL LOW (ref >59)
eGFR: 29 mL/min/{1.73_m2} — ABNORMAL LOW (ref >59)

## 2022-02-08 LAB — CBC
Hematocrit: 33.1 % — ABNORMAL LOW (ref 37.5–51.0)
Hemoglobin: 11 g/dL — ABNORMAL LOW (ref 13.0–17.7)
MCH: 29.1 pg (ref 26.6–33.0)
MCHC: 33.2 g/dL (ref 31.5–35.7)
MCV: 88 fL (ref 79–97)
Platelets: 360 10*3/uL (ref 150–450)
RBC: 3.78 x10E6/uL — ABNORMAL LOW (ref 4.14–5.80)
RDW: 12.4 % (ref 11.6–15.4)
WBC: 7.3 10*3/uL (ref 3.4–10.8)

## 2022-02-08 LAB — PRO B NATRIURETIC PEPTIDE: NT-Pro BNP: 1860 pg/mL — ABNORMAL HIGH (ref 0–121)

## 2022-02-08 NOTE — Telephone Encounter (Signed)
Got a call from [place] that K+ was very high.  Called pt, he feels fine, no sx. Advised him not to eat K+ rich foods such as bananas.  No additional problems. F/u as scheduled.  BMET    Component Value Date/Time   NA 140 02/08/2022 0934   K 6.5 (HH) 02/08/2022 0934   CL 109 (H) 02/08/2022 0934   CO2 22 02/08/2022 0934   GLUCOSE 138 (H) 02/08/2022 0934   GLUCOSE 116 (H) 02/02/2022 0308   BUN 35 (H) 02/08/2022 0934   CREATININE 2.59 (H) 02/08/2022 0934   CALCIUM 8.6 (L) 02/08/2022 0934   EGFR 29 (L) 02/08/2022 0934   GFRNONAA 22 (L) 02/02/2022 0308   Samuel Covault, [name] 02/08/2022 8:03 PM

## 2022-02-08 NOTE — Telephone Encounter (Signed)
Notified by overnight fellow that he had received a call overnight that this patient's K was elevated to 7.1   Per chart review, patient's K was 4.3 on 11/22. Possible that sample hemolyzed as K increased from 4.3 to 7.1 in only 6 days, and patient is not on medications that would cause elevations in K.   I contacted the patient and spoke to him and his wife. Patient reports that he is in his usual state of health today, and actually feels better than he did yesterday. Explained that elevated potassium can be very dangerous and cause dangerous arrhythmias. I asked that he go to the [facility] on [address] (same building as out office) to have a stat BMP drawn. Orders placed.   Margie Billet, [name] 02/08/2022 7:11 AM

## 2022-02-09 ENCOUNTER — Encounter (HOSPITAL_COMMUNITY): Payer: Self-pay | Admitting: Internal Medicine

## 2022-02-09 ENCOUNTER — Other Ambulatory Visit: Payer: Self-pay

## 2022-02-09 ENCOUNTER — Ambulatory Visit: Payer: Self-pay | Admitting: Student

## 2022-02-09 ENCOUNTER — Telehealth: Payer: Self-pay | Admitting: Cardiology

## 2022-02-09 ENCOUNTER — Observation Stay (HOSPITAL_COMMUNITY)
Admission: EM | Admit: 2022-02-09 | Discharge: 2022-02-10 | Disposition: A | Discharge status: Home / Self Care | Payer: Self-pay | Attending: Emergency Medicine | Admitting: Emergency Medicine

## 2022-02-09 DIAGNOSIS — F17210 Nicotine dependence, cigarettes, uncomplicated: Secondary | ICD-10-CM | POA: Insufficient documentation

## 2022-02-09 DIAGNOSIS — I5032 Chronic diastolic (congestive) heart failure: Secondary | ICD-10-CM | POA: Insufficient documentation

## 2022-02-09 DIAGNOSIS — N179 Acute kidney failure, unspecified: Secondary | ICD-10-CM | POA: Diagnosis present

## 2022-02-09 DIAGNOSIS — N189 Chronic kidney disease, unspecified: Secondary | ICD-10-CM | POA: Diagnosis present

## 2022-02-09 DIAGNOSIS — I10 Essential (primary) hypertension: Secondary | ICD-10-CM | POA: Insufficient documentation

## 2022-02-09 DIAGNOSIS — I130 Hypertensive heart and chronic kidney disease with heart failure and stage 1 through stage 4 chronic kidney disease, or unspecified chronic kidney disease: Secondary | ICD-10-CM | POA: Insufficient documentation

## 2022-02-09 DIAGNOSIS — Z79899 Other long term (current) drug therapy: Secondary | ICD-10-CM | POA: Insufficient documentation

## 2022-02-09 DIAGNOSIS — I5030 Unspecified diastolic (congestive) heart failure: Secondary | ICD-10-CM | POA: Insufficient documentation

## 2022-02-09 DIAGNOSIS — D649 Anemia, unspecified: Secondary | ICD-10-CM | POA: Insufficient documentation

## 2022-02-09 DIAGNOSIS — N186 End stage renal disease: Secondary | ICD-10-CM | POA: Insufficient documentation

## 2022-02-09 DIAGNOSIS — E875 Hyperkalemia: Principal | ICD-10-CM | POA: Diagnosis present

## 2022-02-09 DIAGNOSIS — N185 Chronic kidney disease, stage 5: Secondary | ICD-10-CM | POA: Insufficient documentation

## 2022-02-09 DIAGNOSIS — N184 Chronic kidney disease, stage 4 (severe): Secondary | ICD-10-CM | POA: Insufficient documentation

## 2022-02-09 DIAGNOSIS — E1122 Type 2 diabetes mellitus with diabetic chronic kidney disease: Secondary | ICD-10-CM | POA: Insufficient documentation

## 2022-02-09 LAB — BASIC METABOLIC PANEL
Anion gap: 11 (ref 5–15)
BUN: 32 mg/dL — ABNORMAL HIGH (ref 6–20)
CO2: 20 mmol/L — ABNORMAL LOW (ref 22–32)
Calcium: 8.3 mg/dL — ABNORMAL LOW (ref 8.9–10.3)
Chloride: 109 mmol/L (ref 98–111)
Creatinine, Ser: 2.57 mg/dL — ABNORMAL HIGH (ref 0.61–1.24)
GFR, Estimated: 29 mL/min — ABNORMAL LOW (ref >60)
Glucose, Bld: 245 mg/dL — ABNORMAL HIGH (ref 70–99)
Potassium: 5.6 mmol/L — ABNORMAL HIGH (ref 3.5–5.1)
Sodium: 140 mmol/L (ref 135–145)

## 2022-02-09 LAB — COMPREHENSIVE METABOLIC PANEL
ALT: 30 U/L (ref 0–44)
AST: 21 U/L (ref 15–41)
Albumin: 2.4 g/dL — ABNORMAL LOW (ref 3.5–5.0)
Alkaline Phosphatase: 75 U/L (ref 38–126)
Anion gap: 6 (ref 5–15)
BUN: 32 mg/dL — ABNORMAL HIGH (ref 6–20)
CO2: 23 mmol/L (ref 22–32)
Calcium: 8.5 mg/dL — ABNORMAL LOW (ref 8.9–10.3)
Chloride: 109 mmol/L (ref 98–111)
Creatinine, Ser: 2.81 mg/dL — ABNORMAL HIGH (ref 0.61–1.24)
GFR, Estimated: 26 mL/min — ABNORMAL LOW (ref >60)
Glucose, Bld: 101 mg/dL — ABNORMAL HIGH (ref 70–99)
Potassium: 6.5 mmol/L (ref 3.5–5.1)
Sodium: 138 mmol/L (ref 135–145)
Total Bilirubin: 0.4 mg/dL (ref 0.3–1.2)
Total Protein: 6 g/dL — ABNORMAL LOW (ref 6.5–8.1)

## 2022-02-09 LAB — CBC WITH DIFFERENTIAL/PLATELET
Abs Immature Granulocytes: 0.03 10*3/uL (ref 0.00–0.07)
Basophils Absolute: 0.1 10*3/uL (ref 0.0–0.1)
Basophils Relative: 1 %
Eosinophils Absolute: 0.2 10*3/uL (ref 0.0–0.5)
Eosinophils Relative: 3 %
HCT: 36.2 % — ABNORMAL LOW (ref 39.0–52.0)
Hemoglobin: 11.2 g/dL — ABNORMAL LOW (ref 13.0–17.0)
Immature Granulocytes: 0 %
Lymphocytes Relative: 18 %
Lymphs Abs: 1.2 10*3/uL (ref 0.7–4.0)
MCH: 29.2 pg (ref 26.0–34.0)
MCHC: 30.9 g/dL (ref 30.0–36.0)
MCV: 94.3 fL (ref 80.0–100.0)
Monocytes Absolute: 0.9 10*3/uL (ref 0.1–1.0)
Monocytes Relative: 13 %
Neutro Abs: 4.6 10*3/uL (ref 1.7–7.7)
Neutrophils Relative %: 65 %
Platelets: 395 10*3/uL (ref 150–400)
RBC: 3.84 MIL/uL — ABNORMAL LOW (ref 4.22–5.81)
RDW: 12.7 % (ref 11.5–15.5)
WBC: 7 10*3/uL (ref 4.0–10.5)
nRBC: 0 % (ref 0.0–0.2)

## 2022-02-09 LAB — URINALYSIS, COMPLETE (UACMP) WITH MICROSCOPIC
Bilirubin Urine: NEGATIVE
Glucose, UA: 150 mg/dL — AB
Hgb urine dipstick: NEGATIVE
Ketones, ur: NEGATIVE mg/dL
Leukocytes,Ua: NEGATIVE
Nitrite: NEGATIVE
Protein, ur: >=300 mg/dL — AB
Specific Gravity, Urine: 1.012 (ref 1.005–1.030)
pH: 6 (ref 5.0–8.0)

## 2022-02-09 LAB — CBG MONITORING, ED
Glucose-Capillary: 127 mg/dL — ABNORMAL HIGH (ref 70–99)
Glucose-Capillary: 228 mg/dL — ABNORMAL HIGH (ref 70–99)

## 2022-02-09 LAB — MAGNESIUM: Magnesium: 2.3 mg/dL (ref 1.7–2.4)

## 2022-02-09 MED ORDER — LEVALBUTEROL HCL 0.63 MG/3ML IN NEBU: 0.6300 mg | INHALATION_SOLUTION | Freq: Four times a day (QID) | RESPIRATORY_TRACT | Status: DC | PRN

## 2022-02-09 MED ORDER — HYDRALAZINE HCL 25 MG PO TABS
25.0000 mg | ORAL_TABLET | Freq: Four times a day (QID) | ORAL | Status: DC | PRN
  Administered 2022-02-10: 25 mg via ORAL
  Filled 2022-02-09: qty 1

## 2022-02-09 MED ORDER — ALBUTEROL SULFATE (2.5 MG/3ML) 0.083% IN NEBU
10.0000 mg | INHALATION_SOLUTION | Freq: Once | RESPIRATORY_TRACT | Status: AC
  Administered 2022-02-09: 10 mg via RESPIRATORY_TRACT
  Filled 2022-02-09: qty 12

## 2022-02-09 MED ORDER — AMLODIPINE BESYLATE 5 MG PO TABS
5.0000 mg | ORAL_TABLET | Freq: Once | ORAL | Status: AC
  Administered 2022-02-09: 5 mg via ORAL
  Filled 2022-02-09: qty 1

## 2022-02-09 MED ORDER — CALCIUM GLUCONATE-NACL 1-0.675 GM/50ML-% IV SOLN
1.0000 g | Freq: Once | INTRAVENOUS | Status: AC
  Administered 2022-02-09: 1000 mg via INTRAVENOUS
  Filled 2022-02-09: qty 50

## 2022-02-09 MED ORDER — FUROSEMIDE 10 MG/ML IJ SOLN
60.0000 mg | Freq: Once | INTRAMUSCULAR | Status: AC
  Administered 2022-02-09: 60 mg via INTRAVENOUS
  Filled 2022-02-09: qty 6

## 2022-02-09 MED ORDER — SODIUM ZIRCONIUM CYCLOSILICATE 10 G PO PACK
10.0000 g | PACK | Freq: Once | ORAL | Status: AC
  Administered 2022-02-09: 10 g via ORAL
  Filled 2022-02-09: qty 1

## 2022-02-09 MED ORDER — AMLODIPINE BESYLATE 5 MG PO TABS
5.0000 mg | ORAL_TABLET | Freq: Every day | ORAL | Status: DC
  Administered 2022-02-10: 5 mg via ORAL
  Filled 2022-02-09 (×2): qty 1

## 2022-02-09 MED ORDER — SODIUM CHLORIDE 0.9 % IV SOLN: INTRAVENOUS | Status: DC

## 2022-02-09 MED ORDER — ALBUTEROL SULFATE (2.5 MG/3ML) 0.083% IN NEBU: 2.5000 mg | INHALATION_SOLUTION | Freq: Four times a day (QID) | RESPIRATORY_TRACT | Status: DC | PRN

## 2022-02-09 MED ORDER — SODIUM CHLORIDE 0.9 % IV SOLN
INTRAVENOUS | Status: DC
  Administered 2022-02-09: 100 mL/h via INTRAVENOUS

## 2022-02-09 MED ORDER — ISOSORBIDE MONONITRATE ER 30 MG PO TB24: 60.0000 mg | ORAL_TABLET | Freq: Every day | ORAL | Status: DC

## 2022-02-09 MED ORDER — CARVEDILOL 12.5 MG PO TABS
12.5000 mg | ORAL_TABLET | Freq: Two times a day (BID) | ORAL | Status: DC
  Administered 2022-02-09 – 2022-02-10 (×2): 12.5 mg via ORAL
  Filled 2022-02-09 (×2): qty 1

## 2022-02-09 MED ORDER — DEXTROSE 50 % IV SOLN
1.0000 | Freq: Once | INTRAVENOUS | Status: AC
  Administered 2022-02-09: 50 mL via INTRAVENOUS
  Filled 2022-02-09: qty 50

## 2022-02-09 MED ORDER — ISOSORBIDE MONONITRATE ER 60 MG PO TB24
60.0000 mg | ORAL_TABLET | Freq: Every day | ORAL | Status: DC
  Administered 2022-02-09 – 2022-02-10 (×2): 60 mg via ORAL
  Filled 2022-02-09: qty 2
  Filled 2022-02-09: qty 1

## 2022-02-09 MED ORDER — SODIUM BICARBONATE 8.4 % IV SOLN
50.0000 meq | Freq: Once | INTRAVENOUS | Status: AC
  Administered 2022-02-09: 50 meq via INTRAVENOUS
  Filled 2022-02-09: qty 50

## 2022-02-09 MED ORDER — INSULIN ASPART 100 UNIT/ML IV SOLN
5.0000 [IU] | Freq: Once | INTRAVENOUS | Status: AC
  Administered 2022-02-09: 5 [IU] via INTRAVENOUS

## 2022-02-09 NOTE — Telephone Encounter (Signed)
Yesterday, I ordered a STAT BMP to follow up on elevated potassium (K elevated to 7.1 on 11/27). BMP on 11/28 showed that potassium remained elevated to 6.5. I called patient and discussed these results, and asked that patient go to the ER for further evaluation. I discussed that due to his elevated K, he is at risk of cardiac arrhythmias or even cardiac arrest. Patient reported that he had been trying to decrease his dietary K intake. Discussed that his kidney function could also be contributing to his elevated K and that dietary changes alone may not lower his K to safe levels quickly. Patient agreeable to going to the ED.   Also discussed with patient's wife, who was concerned that he has not been able to get into see the nephrologist. She said that our office was planning to place the referral, but when she called the nephrologist they did not have enough information to schedule an appointment. She plans to call our office later today to discuss   Margie Billet, [name] 02/09/2022 7:52 AM

## 2022-02-09 NOTE — ED Provider Triage Note (Signed)
Emergency Medicine Provider Triage Evaluation Note  Samuel Gardner , a 51 y.o. male  was evaluated in triage.  Pt complains of hyperkalemia.  Patient actually has no physical complaints, states that since discharge from hospital last week has been doing generally well is having 1 episode of dyspnea that occurred 4 nights ago.  He is here with his wife who assists with history.  Additional details per chart review.  Patient was recently admitted for hypertensive crisis, with evidence for renal dysfunction and cardiac dysfunction.  He was not discharged on Lasix.  Since discharge has had several values of elevated potassium, and has been unable to follow-up with nephrology, and was sent here from cardiology after telephone conversation.  Review of Systems  Positive: Dyspnea a few nights ago Negative: Current chest pain, dyspnea  Physical Exam  BP (!) 203/93 (BP Location: Right Arm)   Pulse 64   Temp 98.9 F (37.2 C) (Oral)   Resp 15   SpO2 99%  Gen:   Awake, no distress speaking clearly Resp:  Normal effort no increased work of breathing MSK:   Moves extremities without difficulty no deformity Other:  Neuro no obvious deformities  Medical Decision Making  Medically screening exam initiated at 10:28 AM.  Appropriate orders placed.  Samuel Gardner was informed that the remainder of the evaluation will be completed by another provider, this initial triage assessment does not replace that evaluation, and the importance of remaining in the ED until their evaluation is complete.   Carmin Muskrat, MD 02/09/22 1029

## 2022-02-09 NOTE — H&P (Signed)
History and Physical    [name] UKG:[id number] DOB: Oct 12, 1970 DOA: 02/09/2022  PCP: Patient, No Pcp Per (Confirm with patient/family/NH records and if not entered, this has to be entered at [facility] point of entry) Patient coming from: Home  I have personally briefly reviewed patient's old medical records in [place]  Chief Complaint: Potassium is high  HPI: Samuel Gardner is a 51 y.o. male with medical history significant of HTN, IIDM, CKD stage IIIa, chronic HFpEF, cocaine abuse, sent from cardiology for evaluation of worsening hyperkalemia.  Patient was recently hospitalized for AKI with hyperkalemia, stabilized and sent home 7 days ago.  2 days ago, patient went to follow-up with cardiologist, and blood work and office showed K7.1, and cardiologist sent patient to ED.  Since last evaluation, patient has modified his diet, however he has a preference to eat fruits and bananas.  But he denies any changes of his urine output, no muscle weakness no chest pain or shortness of breath palpitations.  ED Course: Vital signs stable, none tachycardia nonhypotensive afebrile.  Potassium 6.5, EKG showed tented T wave, patient was given hyperkalemia cocktail including calcium gluconate, bicarb, insulin and glucose.  Review of Systems: As per HPI otherwise 14 point review of systems negative.   Past Medical History:  Diagnosis Date   Diabetes mellitus without complication ([place])    Hypertension     Past Surgical History:  Procedure Laterality Date   LEG SURGERY Right    teenager     reports that he has been smoking cigarettes. He has been smoking an average of 1 pack per day. He has never used smokeless tobacco. He reports current alcohol use. He reports that he does not use drugs.  No Known Allergies  Family History  Problem Relation Age of Onset   Diabetes Mother    Hypertension Mother    Kidney disease Mother    Diabetes Sister    Hypertension Sister      Prior to  Admission medications   Medication Sig Start Date End Date Taking? Authorizing Provider  albuterol (VENTOLIN HFA) 108 (90 Base) MCG/ACT inhaler Inhale 2 puffs into the lungs every 6 (six) hours as needed for wheezing or shortness of breath. 02/02/22   Caren Griffins, MD  amLODipine (NORVASC) 5 MG tablet Take 1 tablet (5 mg total) by mouth daily. 02/07/22   Elgie Collard, [name]  carvedilol (COREG) 12.5 MG tablet Take 1 tablet (12.5 mg total) by mouth 2 (two) times daily with a meal. 02/02/22   [name], Vella Redhead, MD  isosorbide mononitrate (IMDUR) 60 MG 24 hr tablet Take 1 tablet (60 mg total) by mouth daily. 02/07/22   Elgie Collard, [name]  levalbuterol (XOPENEX) 0.63 MG/3ML nebulizer solution Take 3 mLs (0.63 mg total) by nebulization every 6 (six) hours as needed for shortness of breath. 02/07/22   Elgie Collard, [name]  nitroGLYCERIN (NITROSTAT) 0.4 MG SL tablet Place 1 tablet (0.4 mg total) under the tongue every 5 (five) minutes as needed for chest pain. 02/07/22   Elgie Collard, [name]    Physical Exam: Vitals:   02/09/22 1012 02/09/22 1218 02/09/22 1255  BP: (!) 203/93 (!) 195/97   Pulse: 64 69   Resp: 15 15   Temp: 98.9 F (37.2 C)  98 F (36.7 C)  TempSrc: Oral  Oral  SpO2: 99% 97%     Constitutional: NAD, calm, comfortable Vitals:   02/09/22 1012 02/09/22 1218 02/09/22 1255  BP: (!) 203/93 Marland Kitchen)  195/97   Pulse: 64 69   Resp: 15 15   Temp: 98.9 F (37.2 C)  98 F (36.7 C)  TempSrc: Oral  Oral  SpO2: 99% 97%    Eyes: PERRL, lids and conjunctivae normal ENMT: Mucous membranes are moist. Posterior pharynx clear of any exudate or lesions.Normal dentition.  Neck: normal, supple, no masses, no thyromegaly Respiratory: clear to auscultation bilaterally, no wheezing, no crackles. Normal respiratory effort. No accessory muscle use.  Cardiovascular: Regular rate and rhythm, no murmurs / rubs / gallops. No extremity edema. 2+ pedal pulses. No carotid bruits.  Abdomen: no  tenderness, no masses palpated. No hepatosplenomegaly. Bowel sounds positive.  Musculoskeletal: no clubbing / cyanosis. No joint deformity upper and lower extremities. Good ROM, no contractures. Normal muscle tone.  Skin: no rashes, lesions, ulcers. No induration Neurologic: CN 2-12 grossly intact. Sensation intact, DTR normal. Strength 5/5 in all 4.  Psychiatric: Normal judgment and insight. Alert and oriented x 3. Normal mood.     Labs on Admission: I have personally reviewed following labs and imaging studies  CBC: Recent Labs  Lab 02/07/22 1603 02/09/22 1021  WBC 7.3 7.0  NEUTROABS  --  4.6  HGB 11.0* 11.2*  HCT 33.1* 36.2*  MCV 88 94.3  PLT 360 242   Basic Metabolic Panel: Recent Labs  Lab 02/07/22 1603 02/08/22 0934 02/09/22 1021  NA 143 140 138  K 7.1* 6.5* 6.5*  CL 113* 109* 109  CO2 19* 22 23  GLUCOSE 88 138* 101*  BUN 36* 35* 32*  CREATININE 2.76* 2.59* 2.81*  CALCIUM 8.8 8.6* 8.5*  MG  --   --  2.3   GFR: Estimated Creatinine Clearance: 31.6 mL/min (A) (by C-G formula based on SCr of 2.81 mg/dL (H)). Liver Function Tests: Recent Labs  Lab 02/09/22 1021  AST 21  ALT 30  ALKPHOS 75  BILITOT 0.4  PROT 6.0*  ALBUMIN 2.4*   No results for input(s): "LIPASE", "AMYLASE" in the last 168 hours. No results for input(s): "AMMONIA" in the last 168 hours. Coagulation Profile: No results for input(s): "INR", "PROTIME" in the last 168 hours. Cardiac Enzymes: No results for input(s): "CKTOTAL", "CKMB", "CKMBINDEX", "TROPONINI" in the last 168 hours. BNP (last 3 results) Recent Labs    02/07/22 1603  PROBNP 1,860*   HbA1C: No results for input(s): "HGBA1C" in the last 72 hours. CBG: Recent Labs  Lab 02/09/22 1212  GLUCAP 127*   Lipid Profile: No results for input(s): "CHOL", "HDL", "LDLCALC", "TRIG", "CHOLHDL", "LDLDIRECT" in the last 72 hours. Thyroid Function Tests: No results for input(s): "TSH", "T4TOTAL", "FREET4", "T3FREE", "THYROIDAB" in the  last 72 hours. Anemia Panel: No results for input(s): "VITAMINB12", "FOLATE", "FERRITIN", "TIBC", "IRON", "RETICCTPCT" in the last 72 hours. Urine analysis:    Component Value Date/Time   COLORURINE YELLOW 10/07/2010 1830   APPEARANCEUR CLEAR 10/07/2010 1830   LABSPEC 1.039 (H) 10/07/2010 1830   PHURINE 5.5 10/07/2010 1830   GLUCOSEU >1000 (A) 10/07/2010 1830   HGBUR NEGATIVE 10/07/2010 1830   BILIRUBINUR NEGATIVE 10/07/2010 1830   KETONESUR >80 (A) 10/07/2010 1830   PROTEINUR NEGATIVE 10/07/2010 1830   UROBILINOGEN 0.2 10/07/2010 1830   NITRITE NEGATIVE 10/07/2010 1830   LEUKOCYTESUR NEGATIVE 10/07/2010 1830    Radiological Exams on Admission: No results found.  EKG: Independently reviewed.  Sinus, tented T waves on multiple leads.  Assessment/Plan Principal Problem:   Hyperkalemia Active Problems:   AKI (acute kidney injury) ([place])  (please populate well all problems here  in Problem List. (For example, if patient is on BP meds at home and you resume or decide to hold them, it is a problem that needs to be her. Same for CAD, COPD, HLD and so on)  Hyperkalemia -Probably related to his high potassium diet -Recommend cut down fruits intake and avoid high potassium foods such as bananas, patient and his wife expressed understanding. -Follow-up repeat potassium in 2 hours, and continue hyperkalemia treatment if necessary, no indication for emergency dialysis at this point.  Nephrology consulted in the ER. -Medication list reviewed, no significant iatrogenic hyperkalemia etiology. -Trial of IV fluid, monitor volume status  HTN, uncontrolled -Resume home BP meds including amlodipine, Coreg and Imdur, add as needed hydralazine  Chronic HFpEF -Euvolemic, blood pressure uncontrolled, management as above -On IV fluid, monitor volume status.  CKD stage III -Euvolemic, nephrology follows  Cocaine abuse -Denied recent use.  On Coreg.  Chronic normocytic anemia secondary to  CKD -H&H stable, outpatient iron study and nephrology follow-up  Cigarette smoke -Cessation education performed at bedside   DVT prophylaxis: SCD Code Status: Full code Family Communication: Wife at bedside Disposition Plan: Expect less than 2 midnight hospital stay Consults called: Nephrology Admission status: Tele obs   Lequita Halt MD [facility] Pager [phone]  02/09/2022, 1:42 PM

## 2022-02-09 NOTE — ED Triage Notes (Signed)
Patient sent to ED for evaluation of hyperkalemia, 6.5 yesterday,7.1 two days ago. Was referred to nephrologist but has not yet received a call to make that appointment. Patient has no complaints, is alert, oriented, and in no apparent distress.

## 2022-02-09 NOTE — Consult Note (Signed)
[facility] Renal Consultation Note  Requesting MD: Wynetta Fines, MD Indication for Consultation:  hyperkalemia and CKD  Chief complaint: high potassium on outpatient labs  HPI:  Samuel Gardner is a 51 y.o. male with a history of chronic diastolic CHF, DM, HTN who presented after outpatient labs demonstrated hyperkalemia x 2.  Note he has a recent history of AKI vs CKD as below noted during an admission with heart failure an overload.  He had been discharged on 11/22.  He saw cardiology in follow-up and then on check x 2 was hyperkalemic.  He had been on ibuprofen and naproxen and Goody's prior to the last admission and he hasn't had any since discharge.  He knows that tylenol is the safest over the counter pain med for him.  He has been trying to be healthier since discharge so went out and got lots of fruits and vegetables.  We discussed low and high K foods and he's been having a lot of high potassium foods I.e. bananas.  His wife is at bedside and supplements his history.  Per charting, DM and HTN were untreated for several years (5-8).  They state that they had just been referred to our office a couple of days ago and then presented here.    [phone] Jeannene Patella (patient's wife's cell - they provided this number as a first contact)  [phone] - patient's cell   Creatinine, Ser  Date/Time Value Ref Range Status  02/09/2022 10:21 AM 2.81 (H) 0.61 - 1.24 mg/dL Final  02/08/2022 09:34 AM 2.59 (H) 0.76 - 1.27 mg/dL Final  02/07/2022 04:03 PM 2.76 (H) 0.76 - 1.27 mg/dL Final  02/02/2022 03:08 AM 3.23 (H) 0.61 - 1.24 mg/dL Final  02/01/2022 01:33 AM 3.79 (H) 0.61 - 1.24 mg/dL Final  01/31/2022 02:07 AM 3.21 (H) 0.61 - 1.24 mg/dL Final  01/30/2022 03:51 AM 2.62 (H) 0.61 - 1.24 mg/dL Final  01/29/2022 05:16 PM 2.50 (H) 0.61 - 1.24 mg/dL Final  07/24/2016 11:15 AM 1.01 0.61 - 1.24 mg/dL Final  10/07/2010 06:40 PM 0.99 0.50 - 1.35 mg/dL Final     PMHx:   Past Medical History:   Diagnosis Date   Diabetes mellitus without complication ([place])    Hypertension     Past Surgical History:  Procedure Laterality Date   LEG SURGERY Right    teenager    Family Hx: his mother had DM and HTN as below.  She was also on dialysis Family History  Problem Relation Age of Onset   Diabetes Mother    Hypertension Mother    Kidney disease Mother    Diabetes Sister    Hypertension Sister     Social History:  reports that he has been smoking cigarettes. He has been smoking an average of 1 pack per day. He has never used smokeless tobacco. He reports current alcohol use. He reports that he does not use drugs.  Allergies: No Known Allergies  Medications: Prior to Admission medications   Medication Sig Start Date End Date Taking? Authorizing Provider  albuterol (VENTOLIN HFA) 108 (90 Base) MCG/ACT inhaler Inhale 2 puffs into the lungs every 6 (six) hours as needed for wheezing or shortness of breath. 02/02/22  Yes [name], Vella Redhead, MD  amLODipine (NORVASC) 5 MG tablet Take 1 tablet (5 mg total) by mouth daily. 02/07/22  Yes [name], [name], [name]  carvedilol (COREG) 12.5 MG tablet Take 1 tablet (12.5 mg total) by mouth 2 (two) times daily with a meal. 02/02/22  Yes Caren Griffins, MD  isosorbide mononitrate (IMDUR) 60 MG 24 hr tablet Take 1 tablet (60 mg total) by mouth daily. 02/07/22  Yes [name], [name], [name]  levalbuterol (XOPENEX) 0.63 MG/3ML nebulizer solution Take 3 mLs (0.63 mg total) by nebulization every 6 (six) hours as needed for shortness of breath. 02/07/22   Elgie Collard, [name]  nitroGLYCERIN (NITROSTAT) 0.4 MG SL tablet Place 1 tablet (0.4 mg total) under the tongue every 5 (five) minutes as needed for chest pain. 02/07/22   Elgie Collard, [name]    I have reviewed the patient's current and reported prior to admission medications.   Labs:     Latest Ref Rng & Units 02/09/2022   10:21 AM 02/08/2022    9:34 AM 02/07/2022    4:03 PM  BMP  Glucose 70 - 99  mg/dL 101  138  88   BUN 6 - 20 mg/dL 32  35  36   Creatinine 0.61 - 1.24 mg/dL 2.81  2.59  2.76   BUN/Creat Ratio 9 - 20  14  13    Sodium 135 - 145 mmol/L 138  140  143   Potassium 3.5 - 5.1 mmol/L 6.5  6.5  7.1   Chloride 98 - 111 mmol/L 109  109  113   CO2 22 - 32 mmol/L 23  22  19    Calcium 8.9 - 10.3 mg/dL 8.5  8.6  8.8     ROS:  Pertinent items noted in HPI and remainder of comprehensive ROS otherwise negative.  Physical Exam: Vitals:   02/09/22 1540 02/09/22 1645  BP: (!) 192/96 (!) 183/85  Pulse: 83 74  Resp: 15   Temp:    SpO2: 98%      General: adult male in bed in NAD   HEENT: NCAT Eyes: EOMI sclera anicteric Neck:  supple trachea midline  Heart: S1S2 no rub Lungs:  clear and unlabored on room air  Abdomen: soft/nt/nd Extremities: 1+ edema bilateral lower extremities; no cyanosis or clubbing  Skin: no rash on extremities exposed  Psych normal mood and affect Neuro:  alert and oriented x 3 provides hx and follows commands   Assessment/Plan:  # Hyperkalemia  - Increased dietary potassium intake in the setting of CKD and DM - Improving on repeat BMP - Discontinue fluids  - s/p bicarb, insulin/glucose, calcium, and lokelma - Discussed low K diet and gave them resource of kidney.org re: same  - BMP in am   # AKI  - Appears improved from prior but still consistent with CKD stage IV if this is actually just his new baseline.  He had a gap in labs so hard to know  - Secondary to chronic NSAID use, HTN, and DM.  Note DM under good control recently with A1c under 6 - BMP and phos in AM   # HTN  - Uncontrolled  - stop fluids - Lasix 60 mg IV once now and will plan on lasix 40 mg daily as needed as an outpatient - may need to titrate per response  - Continue other current regimen   # Chronic diastolic CHF   - lasix once now  - Stopping fluids  - Discussed fluid restrictions  - as above will need lasix on discharge   Disposition - improving.  Hopeful for  d/c tomorrow.  Will reassess tomorrow.  I will set up follow-up with myself or extender clinic at [facility] in 1-2 weeks.  See patient's preferred phone numbers  above   Thank you for the consult.  Please do not hesitate to contact me with any questions    Claudia Desanctis 02/09/2022, 5:54 PM

## 2022-02-09 NOTE — ED Provider Notes (Signed)
[facility] EMERGENCY DEPARTMENT Provider Note   CSN: [id number] Arrival date & time: 02/09/22  3785     History  Chief Complaint  Patient presents with   Abnormal Lab    Samuel Gardner is a 51 y.o. male.   Abnormal Lab    Patient with medical history of hypertension, diabetes, recent admission for acute onset of CHF and stage III kidney disease presents to the emergency department due to abnormal lab value.  Patient since being released from the hospital seen by cardiology and had a potassium of 7.12 days ago, repeat potassium 6.5 yesterday.  Told to go to the ED by cardiologist.  Patient is normally medically compliant, did not take his meds today prior to arrival however.  He denies any nausea, vomiting.  Does feel short of breath not significantly changed from admission.  Denies any current chest pain.  Home Medications Prior to Admission medications   Medication Sig Start Date End Date Taking? Authorizing Provider  albuterol (VENTOLIN HFA) 108 (90 Base) MCG/ACT inhaler Inhale 2 puffs into the lungs every 6 (six) hours as needed for wheezing or shortness of breath. 02/02/22   Caren Griffins, MD  amLODipine (NORVASC) 5 MG tablet Take 1 tablet (5 mg total) by mouth daily. 02/07/22   Elgie Collard, [name]  carvedilol (COREG) 12.5 MG tablet Take 1 tablet (12.5 mg total) by mouth 2 (two) times daily with a meal. 02/02/22   [name], Vella Redhead, MD  isosorbide mononitrate (IMDUR) 60 MG 24 hr tablet Take 1 tablet (60 mg total) by mouth daily. 02/07/22   Elgie Collard, [name]  levalbuterol (XOPENEX) 0.63 MG/3ML nebulizer solution Take 3 mLs (0.63 mg total) by nebulization every 6 (six) hours as needed for shortness of breath. 02/07/22   Elgie Collard, [name]  nitroGLYCERIN (NITROSTAT) 0.4 MG SL tablet Place 1 tablet (0.4 mg total) under the tongue every 5 (five) minutes as needed for chest pain. 02/07/22   Elgie Collard, [name]      Allergies    Patient has no known  allergies.    Review of Systems   Review of Systems  Physical Exam Updated Vital Signs BP (!) 195/97 (BP Location: Right Arm)   Pulse 69   Temp 98.9 F (37.2 C) (Oral)   Resp 15   SpO2 97%  Physical Exam Vitals and nursing note reviewed. Exam conducted with a chaperone present.  Constitutional:      Appearance: Normal appearance.  HENT:     Head: Normocephalic and atraumatic.  Eyes:     General: No scleral icterus.       Right eye: No discharge.        Left eye: No discharge.     Extraocular Movements: Extraocular movements intact.     Pupils: Pupils are equal, round, and reactive to light.  Cardiovascular:     Rate and Rhythm: Normal rate and regular rhythm.     Pulses: Normal pulses.     Heart sounds: Normal heart sounds. No murmur heard.    No friction rub. No gallop.  Pulmonary:     Effort: Pulmonary effort is normal. No respiratory distress.     Breath sounds: Normal breath sounds.  Abdominal:     General: Abdomen is flat. Bowel sounds are normal. There is no distension.     Palpations: Abdomen is soft.     Tenderness: There is no abdominal tenderness.  Musculoskeletal:     Right lower leg: Edema present.  Left lower leg: Edema present.  Skin:    General: Skin is warm and dry.     Coloration: Skin is not jaundiced.  Neurological:     Mental Status: He is alert. Mental status is at baseline.     Coordination: Coordination normal.     ED Results / Procedures / Treatments   Labs (all labs ordered are listed, but only abnormal results are displayed) Labs Reviewed  COMPREHENSIVE METABOLIC PANEL - Abnormal; Notable for the following components:      Result Value   Potassium 6.5 (*)    Glucose, Bld 101 (*)    BUN 32 (*)    Creatinine, Ser 2.81 (*)    Calcium 8.5 (*)    Total Protein 6.0 (*)    Albumin 2.4 (*)    GFR, Estimated 26 (*)    All other components within normal limits  CBC WITH DIFFERENTIAL/PLATELET - Abnormal; Notable for the following  components:   RBC 3.84 (*)    Hemoglobin 11.2 (*)    HCT 36.2 (*)    All other components within normal limits  CBG MONITORING, ED - Abnormal; Notable for the following components:   Glucose-Capillary 127 (*)    All other components within normal limits  MAGNESIUM  BASIC METABOLIC PANEL  POTASSIUM    EKG EKG Interpretation  Date/Time:  Wednesday February 09 2022 10:17:19 EST Ventricular Rate:  63 PR Interval:  156 QRS Duration: 94 QT Interval:  412 QTC Calculation: 421 R Axis:   53 Text Interpretation: Normal sinus rhythm Minimal voltage criteria for LVH, may be normal variant ( Sokolow-Lyon ) Septal infarct , age undetermined Abnormal ECG Confirmed by Carmin Muskrat [id number]) on 02/09/2022 10:18:15 AM  Radiology No results found.  Procedures .Critical Care  Performed by: Sherrill Raring, [name] Authorized by: Sherrill Raring, [name]   Critical care provider statement:    Critical care time (minutes):  30   Critical care start time:  02/09/2022 11:45 AM   Critical care end time:  02/09/2022 12:15 PM   Critical care time was exclusive of:  Separately billable procedures and treating other patients   Critical care was necessary to treat or prevent imminent or life-threatening deterioration of the following conditions:  Renal failure and metabolic crisis   Critical care was time spent personally by me on the following activities:  Development of treatment plan with patient or surrogate, discussions with consultants, evaluation of patient's response to treatment, examination of patient, ordering and review of laboratory studies, ordering and review of radiographic studies, ordering and performing treatments and interventions, pulse oximetry, re-evaluation of patient's condition and review of old charts   Care discussed with: admitting provider       Medications Ordered in ED Medications  isosorbide mononitrate (IMDUR) 24 hr tablet 60 mg (60 mg Oral Given 02/09/22 1219)  sodium  bicarbonate injection 50 mEq (has no administration in time range)  amLODipine (NORVASC) tablet 5 mg (5 mg Oral Given 02/09/22 1219)  calcium gluconate 1 g/ 50 mL sodium chloride IVPB (1,000 mg Intravenous New Bag/Given 02/09/22 1228)  albuterol (PROVENTIL) (2.5 MG/3ML) 0.083% nebulizer solution 10 mg (10 mg Nebulization Given 02/09/22 1224)  sodium zirconium cyclosilicate (LOKELMA) packet 10 g (10 g Oral Given 02/09/22 1231)  insulin aspart (novoLOG) injection 5 Units (5 Units Intravenous Given 02/09/22 1220)    And  dextrose 50 % solution 50 mL (50 mLs Intravenous Given 02/09/22 1224)    ED Course/ Medical Decision Making/ A&P  Medical Decision Making Amount and/or Complexity of Data Reviewed Labs: ordered.  Risk OTC drugs. Prescription drug management. Decision regarding hospitalization.   This is a 51 year old male presenting today due to new hyperkalemia.  Differential includes but not limited to left TriLyte abnormality, arrhythmia, worsening renal failure.  I viewed external medical records including previous labs and cardiology visit.  Also reviewed admission notes.  Patient is partner is at bedside providing independent history.  I ordered, viewed and interpreted laboratory workup.  No leukocytosis, anemia with a hemoglobin 11.2.  Potassium 6.5, creatinine baseline at 2.81.  Mag within normal limits at 2.3.  EKG ordered by myself, peaked T waves.  I ordered home blood pressure medicine 5 of amlodipine, 60 mg Imdur.  I ordered albuterol, Lokelma, 5 insulin, 50% dextrose, and calcium gluconate 1 g.  I consulted with nephrology and spoke with Dr. Royce Macadamia who agrees with admission and will consult.  I consult with hospitalist service and spoke with Dr. Roosevelt Locks who agrees with admission.        Final Clinical Impression(s) / ED Diagnoses Final diagnoses:  Hyperkalemia    Rx / DC Orders ED Discharge Orders     None         Sherrill Raring,  [name] 02/09/22 1250    Tretha Sciara, MD 02/10/22 1056

## 2022-02-09 NOTE — ED Notes (Signed)
Per Malachy Mood, RN; room is ready for pt to come up

## 2022-02-10 DIAGNOSIS — N184 Chronic kidney disease, stage 4 (severe): Secondary | ICD-10-CM

## 2022-02-10 DIAGNOSIS — I10 Essential (primary) hypertension: Secondary | ICD-10-CM

## 2022-02-10 DIAGNOSIS — I5030 Unspecified diastolic (congestive) heart failure: Secondary | ICD-10-CM | POA: Insufficient documentation

## 2022-02-10 DIAGNOSIS — N186 End stage renal disease: Secondary | ICD-10-CM | POA: Insufficient documentation

## 2022-02-10 DIAGNOSIS — N179 Acute kidney failure, unspecified: Secondary | ICD-10-CM

## 2022-02-10 DIAGNOSIS — I5032 Chronic diastolic (congestive) heart failure: Secondary | ICD-10-CM

## 2022-02-10 DIAGNOSIS — N185 Chronic kidney disease, stage 5: Secondary | ICD-10-CM | POA: Insufficient documentation

## 2022-02-10 LAB — CBC
HCT: 32.3 % — ABNORMAL LOW (ref 39.0–52.0)
Hemoglobin: 10.4 g/dL — ABNORMAL LOW (ref 13.0–17.0)
MCH: 29.5 pg (ref 26.0–34.0)
MCHC: 32.2 g/dL (ref 30.0–36.0)
MCV: 91.5 fL (ref 80.0–100.0)
Platelets: 339 10*3/uL (ref 150–400)
RBC: 3.53 MIL/uL — ABNORMAL LOW (ref 4.22–5.81)
RDW: 12.6 % (ref 11.5–15.5)
WBC: 6.6 10*3/uL (ref 4.0–10.5)
nRBC: 0 % (ref 0.0–0.2)

## 2022-02-10 LAB — BASIC METABOLIC PANEL
Anion gap: 8 (ref 5–15)
BUN: 28 mg/dL — ABNORMAL HIGH (ref 6–20)
CO2: 25 mmol/L (ref 22–32)
Calcium: 8.6 mg/dL — ABNORMAL LOW (ref 8.9–10.3)
Chloride: 107 mmol/L (ref 98–111)
Creatinine, Ser: 2.68 mg/dL — ABNORMAL HIGH (ref 0.61–1.24)
GFR, Estimated: 28 mL/min — ABNORMAL LOW (ref >60)
Glucose, Bld: 140 mg/dL — ABNORMAL HIGH (ref 70–99)
Potassium: 4.7 mmol/L (ref 3.5–5.1)
Sodium: 140 mmol/L (ref 135–145)

## 2022-02-10 LAB — PHOSPHORUS: Phosphorus: 3.9 mg/dL (ref 2.5–4.6)

## 2022-02-10 MED ORDER — FUROSEMIDE 20 MG PO TABS: 40.0000 mg | ORAL_TABLET | Freq: Every day | ORAL | 0 refills | Status: DC | PRN

## 2022-02-10 MED ORDER — FUROSEMIDE 10 MG/ML IJ SOLN
60.0000 mg | Freq: Once | INTRAMUSCULAR | Status: AC
  Administered 2022-02-10: 60 mg via INTRAVENOUS
  Filled 2022-02-10: qty 6

## 2022-02-10 MED ORDER — HYDRALAZINE HCL 25 MG PO TABS: 25.0000 mg | ORAL_TABLET | Freq: Three times a day (TID) | ORAL | 2 refills | Status: DC

## 2022-02-10 NOTE — TOC Transition Note (Signed)
Transition of Care [facility]) - CM/SW Discharge Note   Patient Details  Name: Quindarrius Joplin MRN: [id number] Date of Birth: June 18, 1970  [facility]) CM/SW Contact:  Tom-Johnson, Renea Ee, RN Phone Number: 02/10/2022, 11:09 AM   Clinical Narrative:     Patient is scheduled for discharge today. Hospital f/u scheduled with Internal medicine, info on AVS. No PT/OT needs or recommendations noted. Wife to transport at discharge. No further TOC needs noted.     Final next level of care: Home/Self Care Barriers to Discharge: Barriers Resolved   Patient Goals and CMS Choice Patient states their goals for this hospitalization and ongoing recovery are:: To return home CMS Medicare.gov Compare Post Acute Care list provided to:: Patient Choice offered to / list presented to : NA  Discharge Placement                Patient to be transferred to facility by: Family      Discharge Plan and Services                DME Arranged: N/A DME Agency: NA       HH Arranged: NA HH Agency: NA        Social Determinants of Health (SDOH) Interventions     Readmission Risk Interventions     No data to display

## 2022-02-10 NOTE — Plan of Care (Signed)
Contacted by primary team.  Patient is stable for discharge from their standpoint.  Chart reviewed.  Patient not seen this am.   K improved   Give lasix 60 mg IV once now to optimize volume/BP  Would discharge on lasix 40 mg PO daily as needed for swelling.  May ultimately need to increase to 80 mg PO if poor response  I have discussed low K diet.   Start hydralazine 25 mg TID in addition to current regimen for BP   I will set up follow-up with [facility] in 1-2 weeks with myself or an extender   Claudia Desanctis, MD 9:20 AM 02/10/2022

## 2022-02-10 NOTE — Discharge Summary (Signed)
Physician Discharge Summary  Samuel Gardner PIR:[id number] DOB: January 25, 1971 DOA: 02/09/2022  PCP: Patient, No Pcp Per  Admit date: 02/09/2022 Discharge date: 02/10/2022  Admitted From: Home  Discharge disposition: Home    Recommendations for Outpatient Follow-Up:   Follow up with your primary care provider in one week.  Check CBC, BMP, magnesium in the next visit  Discharge Diagnosis:   Principal Problem:   Hyperkalemia -resolved Active Problems:   CKD (chronic kidney disease), stage IV (HCC)   Chronic diastolic CHF (congestive heart failure) ([place])   Essential hypertension   Discharge Condition: Improved.  Diet recommendation: Low sodium, heart healthy.  Low potassium diet  Wound care: None.  Code status: Full.   History of Present Illness:   Samuel Gardner is a 51 years old male with medical history significant of essential hypertension, type 2 diabetes, CKD stage IIIa, HFpEF, cocaine abuse was sent from cardiology office with complaints of hyperkalemia.  Of note patient was recently hospitalized for AKI with hyperkalemia and was discharged a week prior to this presentation.  Since last evaluation Cardiology has modified his diet eating more fruits and bananas.  In the ED, patient had stable vitals.  Initial potassium was 6.5.  EKG showed tented T waves.  Patient was given hyperkalemia treatment in the ED including calcium gluconate, bicarb, insulin and glucose and was admitted to hospital for further evaluation and treatment.   Hospital Course:   Following conditions were addressed during hospitalization as listed below,  Hyperkalemia with abnormal EKG. Likely secondary to high potassium diet.  Potassium today at 4.7.  Counseled against potassium rich food.  Will follow-up with nephrology as outpatient.   HTN, uncontrolled On amlodipine, Coreg and Imdur.  Patient will be started on hydralazine 25 mg p.o. 3 times daily on discharge as per nephrology  recommendation.   Chronic HFpEF Peripheral edema.  Will receive IV Lasix prior to discharge.  Will be on as needed oral Lasix on discharge.  Will follow-up with nephrology as outpatient.  CKD stage IV secondary to chronic NSAIDs, hypertension and diabetes. Followed by nephrology as outpatient.   History of cocaine abuse -Denied recent use.  On Coreg.  Will continue on discharge.   Chronic normocytic anemia secondary to CKD Follows up with nephrology as outpatient.     Cigarette smoking Counseling done.  Disposition.  At this time, patient is stable for disposition with outpatient nephrology follow-up.  Medical Consultants:   Nephrology  Procedures:    None Subjective:   Today, patient examined at bedside.  Denies any dizziness, lightheadedness shortness of breath, chest pain.  States that his leg swelling has gone down significantly.  Discharge Exam:   Vitals:   02/10/22 0450 02/10/22 0848  BP: (!) 176/97 (!) 181/82  Pulse: 68 63  Resp: 18 19  Temp: 98.1 F (36.7 C) (!) 97.5 F (36.4 C)  SpO2: 100% 97%   Vitals:   02/09/22 2203 02/10/22 0201 02/10/22 0450 02/10/22 0848  BP: (!) 179/79 (!) 152/79 (!) 176/97 (!) 181/82  Pulse: 69 66 68 63  Resp: 18 18 18 19   Temp: 98.2 F (36.8 C) 98.1 F (36.7 C) 98.1 F (36.7 C) (!) 97.5 F (36.4 C)  TempSrc: Oral Oral Oral Oral  SpO2: 96% 96% 100% 97%  Weight: 79.1 kg      General: Alert awake, not in obvious distress HENT: pupils equally reacting to light,  No scleral pallor or icterus noted. Oral mucosa is moist.  Chest:  Clear breath sounds.  Diminished breath sounds bilaterally. No crackles or wheezes.  CVS: S1 &S2 heard. No murmur.  Regular rate and rhythm. Abdomen: Soft, nontender, nondistended.  Bowel sounds are heard.   Extremities: No cyanosis, clubbing with bilateral lower extremity edema.   Psych: Alert, awake and oriented, normal mood CNS:  No cranial nerve deficits.  Power equal in all extremities.    Skin: Warm and dry.  No rashes noted.  The results of significant diagnostics from this hospitalization (including imaging, microbiology, ancillary and laboratory) are listed below for reference.     Diagnostic Studies:   No results found.   Labs:   Basic Metabolic Panel: Recent Labs  Lab 02/07/22 1603 02/08/22 0934 02/09/22 1021 02/09/22 1551 02/10/22 0620  NA 143 140 138 140 140  K 7.1* 6.5* 6.5* 5.6* 4.7  CL 113* 109* 109 109 107  CO2 19* 22 23 20* 25  GLUCOSE 88 138* 101* 245* 140*  BUN 36* 35* 32* 32* 28*  CREATININE 2.76* 2.59* 2.81* 2.57* 2.68*  CALCIUM 8.8 8.6* 8.5* 8.3* 8.6*  MG  --   --  2.3  --   --   PHOS  --   --   --   --  3.9   GFR Estimated Creatinine Clearance: 30.5 mL/min (A) (by C-G formula based on SCr of 2.68 mg/dL (H)). Liver Function Tests: Recent Labs  Lab 02/09/22 1021  AST 21  ALT 30  ALKPHOS 75  BILITOT 0.4  PROT 6.0*  ALBUMIN 2.4*   No results for input(s): "LIPASE", "AMYLASE" in the last 168 hours. No results for input(s): "AMMONIA" in the last 168 hours. Coagulation profile No results for input(s): "INR", "PROTIME" in the last 168 hours.  CBC: Recent Labs  Lab 02/07/22 1603 02/09/22 1021 02/10/22 0620  WBC 7.3 7.0 6.6  NEUTROABS  --  4.6  --   HGB 11.0* 11.2* 10.4*  HCT 33.1* 36.2* 32.3*  MCV 88 94.3 91.5  PLT 360 395 339   Cardiac Enzymes: No results for input(s): "CKTOTAL", "CKMB", "CKMBINDEX", "TROPONINI" in the last 168 hours. BNP: Invalid input(s): "POCBNP" CBG: Recent Labs  Lab 02/09/22 1212 02/09/22 1550  GLUCAP 127* 228*   D-Dimer No results for input(s): "DDIMER" in the last 72 hours. Hgb A1c No results for input(s): "HGBA1C" in the last 72 hours. Lipid Profile No results for input(s): "CHOL", "HDL", "LDLCALC", "TRIG", "CHOLHDL", "LDLDIRECT" in the last 72 hours. Thyroid function studies No results for input(s): "TSH", "T4TOTAL", "T3FREE", "THYROIDAB" in the last 72 hours.  Invalid input(s):  "FREET3" Anemia work up No results for input(s): "VITAMINB12", "FOLATE", "FERRITIN", "TIBC", "IRON", "RETICCTPCT" in the last 72 hours. Microbiology No results found for this or any previous visit (from the past 240 hour(s)).   Discharge Instructions:   Discharge Instructions     Diet - low sodium heart healthy   Complete by: As directed    Discharge instructions   Complete by: As directed    Follow-up with your primary care physician in 1 week.  Do not take potassium rich foods including banana oranges, apple juice etc. check blood work at the next visit.  Seek medical attention for worsening symptoms.  With nephrology in the clinic as scheduled by the clinic.   Increase activity slowly   Complete by: As directed       Allergies as of 02/10/2022   No Known Allergies      Medication List     TAKE these medications    albuterol 108 (90  Base) MCG/ACT inhaler Commonly known as: VENTOLIN HFA Inhale 2 puffs into the lungs every 6 (six) hours as needed for wheezing or shortness of breath.   amLODipine 5 MG tablet Commonly known as: NORVASC Take 1 tablet (5 mg total) by mouth daily.   carvedilol 12.5 MG tablet Commonly known as: COREG Take 1 tablet (12.5 mg total) by mouth 2 (two) times daily with a meal.   furosemide 20 MG tablet Commonly known as: Lasix Take 2 tablets (40 mg total) by mouth daily as needed for fluid or edema (weight gain 3 lbs in one day or 5 pounds in a week).   hydrALAZINE 25 MG tablet Commonly known as: APRESOLINE Take 1 tablet (25 mg total) by mouth 3 (three) times daily.   isosorbide mononitrate 60 MG 24 hr tablet Commonly known as: IMDUR Take 1 tablet (60 mg total) by mouth daily.   levalbuterol 0.63 MG/3ML nebulizer solution Commonly known as: Xopenex Take 3 mLs (0.63 mg total) by nebulization every 6 (six) hours as needed for shortness of breath.   nitroGLYCERIN 0.4 MG SL tablet Commonly known as: NITROSTAT Place 1 tablet (0.4 mg total)  under the tongue every 5 (five) minutes as needed for chest pain.        Follow-up Information     Claudia Desanctis, MD Follow up.   Specialty: Nephrology Why: as scheduled by the clinic Contact information: [place] [address] [phone]         Primary care provider Follow up in 1 week(s).                   Time coordinating discharge: 39 minutes  Signed:  Jya Hughston  [facility] 02/10/2022, 10:48 AM

## 2022-02-10 NOTE — Progress Notes (Signed)
Admission note:  Arrival Method: from ED via stretcher  Mental Orientation: A&Ox4  Telemetry: Yes, box 6 applied  Assessment: See doc flowsheets  Skin: Intact  IV: NSL Pain: No pain  Tubes: N/A  Safety Measures: Patient Handbook has been given, and discussed the Fall Prevention worksheet.  Admission: Completed and admission orders have been written  5MOrientation: Patient has been oriented to the unit, staff and to the room.  Family: none at bedside

## 2022-02-15 DIAGNOSIS — Z0279 Encounter for issue of other medical certificate: Secondary | ICD-10-CM

## 2022-02-15 NOTE — Telephone Encounter (Signed)
FMLA form and payment received. Form in Dr. Theodosia Blender box.

## 2022-02-17 NOTE — Telephone Encounter (Signed)
Spoke with patient's wife, Olin Hauser, to clarify the need for FMLA. Wife states the FMLA is for her so she can assist in her husbands care and be available to go to appointments with him.

## 2022-02-22 ENCOUNTER — Other Ambulatory Visit (HOSPITAL_COMMUNITY): Payer: Self-pay

## 2022-02-22 ENCOUNTER — Ambulatory Visit (INDEPENDENT_AMBULATORY_CARE_PROVIDER_SITE_OTHER): Payer: Self-pay | Admitting: Student

## 2022-02-22 VITALS — BP 187/96 | HR 70 | Temp 97.7°F | Ht 67.0 in | Wt 175.2 lb

## 2022-02-22 DIAGNOSIS — D649 Anemia, unspecified: Secondary | ICD-10-CM

## 2022-02-22 DIAGNOSIS — Z87891 Personal history of nicotine dependence: Secondary | ICD-10-CM

## 2022-02-22 DIAGNOSIS — N184 Chronic kidney disease, stage 4 (severe): Secondary | ICD-10-CM

## 2022-02-22 DIAGNOSIS — I130 Hypertensive heart and chronic kidney disease with heart failure and stage 1 through stage 4 chronic kidney disease, or unspecified chronic kidney disease: Secondary | ICD-10-CM

## 2022-02-22 DIAGNOSIS — I5032 Chronic diastolic (congestive) heart failure: Secondary | ICD-10-CM

## 2022-02-22 DIAGNOSIS — E875 Hyperkalemia: Secondary | ICD-10-CM

## 2022-02-22 DIAGNOSIS — I10 Essential (primary) hypertension: Secondary | ICD-10-CM

## 2022-02-22 MED ORDER — HYDRALAZINE HCL 50 MG PO TABS
50.0000 mg | ORAL_TABLET | Freq: Three times a day (TID) | ORAL | 2 refills | Status: DC
  Filled 2022-02-22: qty 90, 30d supply, fill #0
  Filled 2022-04-19: qty 90, 30d supply, fill #1
  Filled 2022-05-23: qty 90, 30d supply, fill #2

## 2022-02-22 MED ORDER — ISOSORBIDE MONONITRATE ER 60 MG PO TB24
60.0000 mg | ORAL_TABLET | Freq: Every day | ORAL | 3 refills | Status: DC
  Filled 2022-02-22: qty 30, 30d supply, fill #0
  Filled 2022-03-24: qty 30, 30d supply, fill #1
  Filled 2022-04-19 – 2022-04-20 (×3): qty 30, 30d supply, fill #2

## 2022-02-22 NOTE — Patient Instructions (Addendum)
  Thank you, Samuel Gardner, for allowing [place] to provide your care today. Today we discussed . . .  > Hypertension       -Your blood pressure is still high and we are going to make a medication change to help with this.  We are going to increase the hydralazine from 25 mg 3 times a day to 50 mg 3 times a day.  We have sent a new prescription into the pharmacy here but you can take 2 of the 25 mg 3 times a day until you run out as well.  We are not can to change the other medications and you will continue to take isosorbide mononitrate 60 mg daily, carvedilol 12.5 mg twice a day, and amlodipine 5 mg daily.  Please continue to keep a log of your blood pressure as you have been and we will review it at the next visit.  I would also recommend that you take this to your nephrologist and cardiologist appointments next month. > Heart failure       -It appears that you are doing very well with the fluid restriction and diet restrictions for your heart failure and your kidney disease.  You can continue to take the Lasix 40 mg daily as needed for swelling.  If you have any new or return of symptoms please do not hesitate to call our office or call your cardiologist office and we will reevaluate.  I have ordered the following labs for you:  Lab Orders         CBC with Diff         BMP8+Anion Gap       Tests ordered today:  None   Referrals ordered today:   Referral Orders  No referral(s) requested today      I have ordered the following medication/changed the following medications:   Start the following medications: Meds ordered this encounter  Medications   isosorbide mononitrate (IMDUR) 60 MG 24 hr tablet    Sig: Take 1 tablet (60 mg total) by mouth daily.    Dispense:  90 tablet    Refill:  3    IM Program   hydrALAZINE (APRESOLINE) 50 MG tablet    Sig: Take 1 tablet (50 mg total) by mouth 3 (three) times daily.    Dispense:  90 tablet    Refill:  2    IM Program      Follow  up: 2-3 months    Remember:     Should you have any questions or concerns please call the internal medicine clinic at [phone].     Samuel Gardner, [place]

## 2022-02-22 NOTE — Progress Notes (Unsigned)
CC: Hospital follow-up  HPI:  Samuel Gardner is a 51 y.o. male with PMH as below who presents to the clinic to establish care as a hospital follow-up.  Please see encounters tab for problem-based charting.  Past Medical History:  Diagnosis Date   (HFpEF) heart failure with preserved ejection fraction (HCC)    CKD (chronic kidney disease), stage IV (HCC)    Diabetes mellitus without complication (HCC)    Hypertension    Normocytic anemia    Outpatient Medications Prior to Visit  Medication Sig Dispense Refill   albuterol (VENTOLIN HFA) 108 (90 Base) MCG/ACT inhaler Inhale 2 puffs into the lungs every 6 (six) hours as needed for wheezing or shortness of breath. 6.7 g 2   amLODipine (NORVASC) 5 MG tablet Take 1 tablet (5 mg total) by mouth daily. 90 tablet 3   carvedilol (COREG) 12.5 MG tablet Take 1 tablet (12.5 mg total) by mouth 2 (two) times daily with a meal. 60 tablet 0   furosemide (LASIX) 20 MG tablet Take 2 tablets (40 mg total) by mouth daily as needed for fluid or edema (weight gain 3 lbs in one day or 5 pounds in a week). 30 tablet 0   levalbuterol (XOPENEX) 0.63 MG/3ML nebulizer solution Take 3 mLs (0.63 mg total) by nebulization every 6 (six) hours as needed for shortness of breath. 3 mL 0   nitroGLYCERIN (NITROSTAT) 0.4 MG SL tablet Place 1 tablet (0.4 mg total) under the tongue every 5 (five) minutes as needed for chest pain. 25 tablet 3   hydrALAZINE (APRESOLINE) 25 MG tablet Take 1 tablet (25 mg total) by mouth 3 (three) times daily. 90 tablet 2   isosorbide mononitrate (IMDUR) 60 MG 24 hr tablet Take 1 tablet (60 mg total) by mouth daily. 90 tablet 3   No facility-administered medications prior to visit.   Past Surgical History:  Procedure Laterality Date   LEG SURGERY Right    teenager   No Known Allergies  Family History  Problem Relation Age of Onset   Diabetes Mother    Hypertension Mother    Kidney disease Mother    Diabetes Sister    Hypertension  Sister    Social History   Tobacco Use   Smoking status: Former    Packs/day: 1.00    Types: Cigarettes    Quit date: 01/30/2022    Years since quitting: 0.0   Smokeless tobacco: Never  Substance Use Topics   Alcohol use: Not Currently   Drug use: No    Review of Systems:   A comprehensive review of systems was negative except for: Mild intermittent lower extremity edema    Physical Exam:  Vitals:   02/22/22 1606  BP: (!) 187/96  Pulse: 70  Temp: 97.7 F (36.5 C)  TempSrc: Oral  SpO2: 100%  Weight: 175 lb 3.2 oz (79.5 kg)  Height: 5\' 7"  (1.702 m)    Constitutional: Well-appearing middle-age male. In no acute distress. HENT: Normocephalic, atraumatic,  Eyes: Sclera non-icteric, EOM intact Cardio:Regular rate and rhythm. No murmurs, rubs, or gallops. 2+ bilateral radial pulses. Pulm:Clear to auscultation bilaterally. Normal work of breathing on room air. Abdomen: Soft, non-tender, non-distended, positive bowel sounds. MSK: Trace pitting edema of the lower extremities below the knees, more appreciated on the left due to traumatic injury in the right Skin:Warm and dry. Neuro:Alert and oriented x3. No focal deficit noted. Psych:Pleasant mood and affect.   Assessment & Plan:   (HFpEF) heart failure with  preserved ejection fraction [facility]) Patient with new diagnosis of HFpEF with LVEF of 55 to 60% and severe LVH with grade 1 diastolic dysfunction.  Started GDMT last month with Imdur, carvedilol, Lasix, hydralazine, and amlodipine.  Unable to initiate ACE/ARB or spironolactone due to CKD stage IV.  Overall he he is doing well since recent hospitalization for hyperkalemia and has taken his as needed Lasix about one third of the time.  Today he appears euvolemic with just some trace lower extremity edema.  He has not had any limiting dyspnea and has been able to help clean out a storage room without issue.  During his hospitalization for new onset heart failure he was thought to  be at risk of having COPD and was treated for this.  He was prescribed an albuterol inhaler as well as leave albuterol nebulizer solution.  He states that these have not helped with any of his dyspnea.  He is also stopped smoking since leaving the hospital.  We do not believe he has COPD but we will continue to monitor and not refill his inhalers at this time.  - Continue carvedilol 12.5 mg twice daily, Imdur 60 mg daily, Lasix 40 mg daily as needed, amlodipine 5 mg daily, and increase hydralazine to 50 mg 3 times daily - Consider adding SGLT2 at follow-up if renal function stable and GFR above 20  Essential hypertension Patient has had elevated blood pressures that were difficult to control during hospitalization.  He was discharged on amlodipine 10 mg daily, hydralazine 25 mg 3 times daily, Imdur 30 mg daily, and carvedilol 12.5 mg twice daily.  Today his blood pressure is still very elevated at 187/96.  His blood pressures at home have been similar with systolics predominantly in the 160s.  His heart rate has been between 55 and 65 at home.  With his borderline heart rate we do not feel comfortable increasing his carvedilol at this point.  He was recently changed from amlodipine 10 mg to 5 mg due to lower extremity edema and at that time his Imdur was increased from 30 to 60 mg daily. - Increase hydralazine to 50 mg 3 times daily - Continue amlodipine 5 mg daily, Imdur 60 mg daily, and carvedilol 12.5 mg twice daily  CKD (chronic kidney disease), stage IV (HCC) Patient with CKD stage IV with a GFR of 28 most likely secondary to chronic hypertension.  Follows with nephrology outpatient and will see them again on 03/15/2022.  Recently he has had severe hyperkalemia which he was hospitalized for. - BMP today - Continue to follow with nephrology  Hyperkalemia Patient presents for a follow-up after admission for severe hyperkalemia with a potassium of 7.1 with EKG changes of peaked T waves.  He was given  calcium gluconate and Lokelma during his stay and his potassium on discharge was 4.7.  He is felt very well since discharge and had no issues.  He has been trying to eat a low potassium diet and has taken Lasix 40 mg about 5 times over the past 2 weeks. - BMP today  Normocytic anemia Patient with a history of normocytic anemia likely secondary to chronic kidney disease.  Hemoglobin on discharge was 10.4.  No other signs of blood loss including gastrointestinally. - CBC today    Patient seen with Dr.  Ashley Akin, [place] [facility] Resident PGY-1 Pager: [phone]

## 2022-02-23 ENCOUNTER — Ambulatory Visit (HOSPITAL_COMMUNITY)
Admission: RE | Admit: 2022-02-23 | Discharge: 2022-02-23 | Disposition: A | Discharge status: Home / Self Care | Payer: Self-pay | Source: Ambulatory Visit | Attending: Internal Medicine | Admitting: Internal Medicine

## 2022-02-23 ENCOUNTER — Ambulatory Visit (INDEPENDENT_AMBULATORY_CARE_PROVIDER_SITE_OTHER): Payer: Self-pay | Admitting: Student

## 2022-02-23 ENCOUNTER — Encounter: Payer: Self-pay | Admitting: Student

## 2022-02-23 ENCOUNTER — Emergency Department (HOSPITAL_COMMUNITY)
Admission: EM | Admit: 2022-02-23 | Discharge: 2022-02-23 | Disposition: A | Discharge status: Home / Self Care | Payer: Self-pay | Attending: Emergency Medicine | Admitting: Emergency Medicine

## 2022-02-23 ENCOUNTER — Other Ambulatory Visit: Payer: Self-pay

## 2022-02-23 VITALS — BP 168/77 | HR 57 | Temp 98.0°F | Ht 67.0 in | Wt 174.4 lb

## 2022-02-23 DIAGNOSIS — I509 Heart failure, unspecified: Secondary | ICD-10-CM | POA: Insufficient documentation

## 2022-02-23 DIAGNOSIS — E875 Hyperkalemia: Secondary | ICD-10-CM | POA: Insufficient documentation

## 2022-02-23 DIAGNOSIS — D649 Anemia, unspecified: Secondary | ICD-10-CM | POA: Insufficient documentation

## 2022-02-23 DIAGNOSIS — Z794 Long term (current) use of insulin: Secondary | ICD-10-CM | POA: Insufficient documentation

## 2022-02-23 DIAGNOSIS — N189 Chronic kidney disease, unspecified: Secondary | ICD-10-CM | POA: Insufficient documentation

## 2022-02-23 LAB — BMP8+ANION GAP
Anion Gap: 11 mmol/L (ref 10.0–18.0)
BUN/Creatinine Ratio: 16 (ref 9–20)
BUN: 50 mg/dL — ABNORMAL HIGH (ref 6–24)
CO2: 18 mmol/L — ABNORMAL LOW (ref 20–29)
Calcium: 8.5 mg/dL — ABNORMAL LOW (ref 8.7–10.2)
Chloride: 114 mmol/L — ABNORMAL HIGH (ref 96–106)
Creatinine, Ser: 3.11 mg/dL — ABNORMAL HIGH (ref 0.76–1.27)
Glucose: 97 mg/dL (ref 70–99)
Potassium: 6.1 mmol/L — ABNORMAL HIGH (ref 3.5–5.2)
Sodium: 143 mmol/L (ref 134–144)
eGFR: 23 mL/min/{1.73_m2} — ABNORMAL LOW (ref >59)

## 2022-02-23 LAB — BASIC METABOLIC PANEL
Anion gap: 4 — ABNORMAL LOW (ref 5–15)
Anion gap: 5 (ref 5–15)
BUN: 43 mg/dL — ABNORMAL HIGH (ref 6–20)
BUN: 47 mg/dL — ABNORMAL HIGH (ref 6–20)
CO2: 20 mmol/L — ABNORMAL LOW (ref 22–32)
CO2: 20 mmol/L — ABNORMAL LOW (ref 22–32)
Calcium: 8.5 mg/dL — ABNORMAL LOW (ref 8.9–10.3)
Calcium: 8.5 mg/dL — ABNORMAL LOW (ref 8.9–10.3)
Chloride: 116 mmol/L — ABNORMAL HIGH (ref 98–111)
Chloride: 116 mmol/L — ABNORMAL HIGH (ref 98–111)
Creatinine, Ser: 2.91 mg/dL — ABNORMAL HIGH (ref 0.61–1.24)
Creatinine, Ser: 2.85 mg/dL — ABNORMAL HIGH (ref 0.61–1.24)
GFR, Estimated: 25 mL/min — ABNORMAL LOW (ref >60)
GFR, Estimated: 26 mL/min — ABNORMAL LOW (ref >60)
Glucose, Bld: 123 mg/dL — ABNORMAL HIGH (ref 70–99)
Glucose, Bld: 132 mg/dL — ABNORMAL HIGH (ref 70–99)
Potassium: 6.3 mmol/L (ref 3.5–5.1)
Potassium: 5.7 mmol/L — ABNORMAL HIGH (ref 3.5–5.1)
Sodium: 140 mmol/L (ref 135–145)
Sodium: 141 mmol/L (ref 135–145)

## 2022-02-23 LAB — CBC WITH DIFFERENTIAL/PLATELET
Basophils Absolute: 0.1 10*3/uL (ref 0.0–0.2)
Basos: 1 %
EOS (ABSOLUTE): 0.3 10*3/uL (ref 0.0–0.4)
Eos: 4 %
Hematocrit: 31 % — ABNORMAL LOW (ref 37.5–51.0)
Hemoglobin: 10.2 g/dL — ABNORMAL LOW (ref 13.0–17.7)
Immature Grans (Abs): 0 10*3/uL (ref 0.0–0.1)
Immature Granulocytes: 0 %
Lymphocytes Absolute: 1.4 10*3/uL (ref 0.7–3.1)
Lymphs: 23 %
MCH: 29.3 pg (ref 26.6–33.0)
MCHC: 32.9 g/dL (ref 31.5–35.7)
MCV: 89 fL (ref 79–97)
Monocytes Absolute: 0.9 10*3/uL (ref 0.1–0.9)
Monocytes: 14 %
Neutrophils Absolute: 3.6 10*3/uL (ref 1.4–7.0)
Neutrophils: 58 %
Platelets: 317 10*3/uL (ref 150–450)
RBC: 3.48 x10E6/uL — ABNORMAL LOW (ref 4.14–5.80)
RDW: 12.3 % (ref 11.6–15.4)
WBC: 6.2 10*3/uL (ref 3.4–10.8)

## 2022-02-23 LAB — CBC
HCT: 33.8 % — ABNORMAL LOW (ref 39.0–52.0)
Hemoglobin: 10.7 g/dL — ABNORMAL LOW (ref 13.0–17.0)
MCH: 29.4 pg (ref 26.0–34.0)
MCHC: 31.7 g/dL (ref 30.0–36.0)
MCV: 92.9 fL (ref 80.0–100.0)
Platelets: 309 10*3/uL (ref 150–400)
RBC: 3.64 MIL/uL — ABNORMAL LOW (ref 4.22–5.81)
RDW: 12.7 % (ref 11.5–15.5)
WBC: 6.7 10*3/uL (ref 4.0–10.5)
nRBC: 0 % (ref 0.0–0.2)

## 2022-02-23 MED ORDER — SODIUM CHLORIDE 0.9 % IV BOLUS
500.0000 mL | Freq: Once | INTRAVENOUS | Status: AC
  Administered 2022-02-23: 500 mL via INTRAVENOUS

## 2022-02-23 MED ORDER — LOKELMA 10 G PO PACK: 10.0000 g | PACK | Freq: Once | ORAL | 0 refills | Status: AC

## 2022-02-23 MED ORDER — HYDRALAZINE HCL 25 MG PO TABS
50.0000 mg | ORAL_TABLET | Freq: Once | ORAL | Status: AC
  Administered 2022-02-23: 50 mg via ORAL
  Filled 2022-02-23: qty 2

## 2022-02-23 MED ORDER — INSULIN ASPART 100 UNIT/ML IV SOLN
5.0000 [IU] | Freq: Once | INTRAVENOUS | Status: AC
  Administered 2022-02-23: 5 [IU] via INTRAVENOUS
  Filled 2022-02-23: qty 0.05

## 2022-02-23 MED ORDER — FUROSEMIDE 10 MG/ML IJ SOLN
40.0000 mg | Freq: Once | INTRAMUSCULAR | Status: AC
  Administered 2022-02-23: 40 mg via INTRAVENOUS
  Filled 2022-02-23: qty 4

## 2022-02-23 MED ORDER — SODIUM ZIRCONIUM CYCLOSILICATE 10 G PO PACK
10.0000 g | PACK | Freq: Three times a day (TID) | ORAL | Status: DC
  Administered 2022-02-23: 10 g via ORAL
  Filled 2022-02-23: qty 1

## 2022-02-23 MED ORDER — LOKELMA 10 G PO PACK: 10.0000 g | PACK | Freq: Two times a day (BID) | ORAL | 0 refills | Status: AC

## 2022-02-23 MED ORDER — DEXTROSE 50 % IV SOLN
1.0000 | Freq: Once | INTRAVENOUS | Status: AC
  Administered 2022-02-23: 50 mL via INTRAVENOUS
  Filled 2022-02-23: qty 50

## 2022-02-23 NOTE — Progress Notes (Signed)
Patient with return of hyperkalemia 6.1 and slight bump in creatinine to 3.11 with GFR of 23.  CBC stable with normocytic anemia and hemoglobin of 10.2.  Patient called and instructed to come into the clinic for EKG and evaluation.

## 2022-02-23 NOTE — Assessment & Plan Note (Signed)
Patient with a history of normocytic anemia likely secondary to chronic kidney disease.  Hemoglobin on discharge was 10.4.  No other signs of blood loss including gastrointestinally. - CBC today

## 2022-02-23 NOTE — ED Provider Notes (Signed)
[facility] DEPT Provider Note   CSN: [id number] Arrival date & time: 02/23/22  1427     History  Chief Complaint  Patient presents with   abnormal labs    Samuel Gardner is a 51 y.o. male with a history of chronic kidney disease, congestive heart failure, hyperkalemia, presented to ED with concern for abnormal blood work.  The patient had a new PCP appointment recently and had blood test done yesterday, was contacted today that his potassium was high need to come to the ER.  Per medical records review the patient was admitted to the hospital last month for similar condition of hyperkalemia in the setting of acute on chronic kidney disease.  This was managed in the hospital with nephrology consultation, the patient was recommended for a low potassium diet at home, and continues with Lasix.  His wife reports he has been compliant with all of these conditions, although only taking Lasix twice a day "as needed".  He is also on a fluid restricted diet of 1500 cc of fluid a day due to his heart failure.  Overall his edema is improved in his lower legs, and he is not feeling short of breath.  He also denies chest tightness, pressure, palpitations, or lightheadedness.  Records show labs from 12 pm today showed K 6.3 (from 6.1), Cr 2.91 (baseline 2.7approx over past 2 weeks).  HPI     Home Medications Prior to Admission medications   Medication Sig Start Date End Date Taking? Authorizing Provider  sodium zirconium cyclosilicate (LOKELMA) 10 g PACK packet Take 10 g by mouth 2 (two) times daily for 4 days. 02/23/22 02/27/22 Yes Brittish Bolinger, Carola Rhine, MD  albuterol (VENTOLIN HFA) 108 (90 Base) MCG/ACT inhaler Inhale 2 puffs into the lungs every 6 (six) hours as needed for wheezing or shortness of breath. 02/02/22   Caren Griffins, MD  amLODipine (NORVASC) 5 MG tablet Take 1 tablet (5 mg total) by mouth daily. 02/07/22   Elgie Collard, [name]  carvedilol (COREG) 12.5  MG tablet Take 1 tablet (12.5 mg total) by mouth 2 (two) times daily with a meal. 02/02/22   [name], Vella Redhead, MD  furosemide (LASIX) 20 MG tablet Take 2 tablets (40 mg total) by mouth daily as needed for fluid or edema (weight gain 3 lbs in one day or 5 pounds in a week). 02/10/22 03/12/22  Pokhrel, Corrie Mckusick, MD  hydrALAZINE (APRESOLINE) 50 MG tablet Take 1 tablet (50 mg total) by mouth 3 (three) times daily. 02/22/22 05/23/22  Johny Blamer, DO  isosorbide mononitrate (IMDUR) 60 MG 24 hr tablet Take 1 tablet (60 mg total) by mouth daily. 02/22/22   Johny Blamer, DO  levalbuterol Penne Lash) 0.63 MG/3ML nebulizer solution Take 3 mLs (0.63 mg total) by nebulization every 6 (six) hours as needed for shortness of breath. 02/07/22   Elgie Collard, [name]  nitroGLYCERIN (NITROSTAT) 0.4 MG SL tablet Place 1 tablet (0.4 mg total) under the tongue every 5 (five) minutes as needed for chest pain. 02/07/22   Elgie Collard, [name]  sodium zirconium cyclosilicate (LOKELMA) 10 g PACK packet Take 10 g by mouth once for 1 dose. 02/23/22 02/23/22  Johny Blamer, DO      Allergies    Patient has no known allergies.    Review of Systems   Review of Systems  Physical Exam Updated Vital Signs BP (!) 198/93   Pulse 81   Temp 98.8 F (37.1 C) (Oral)   Resp  13   SpO2 100%  Physical Exam Constitutional:      General: He is not in acute distress. HENT:     Head: Normocephalic and atraumatic.  Eyes:     Conjunctiva/sclera: Conjunctivae normal.     Pupils: Pupils are equal, round, and reactive to light.  Cardiovascular:     Rate and Rhythm: Normal rate and regular rhythm.  Pulmonary:     Effort: Pulmonary effort is normal. No respiratory distress.  Abdominal:     General: There is no distension.     Tenderness: There is no abdominal tenderness.  Musculoskeletal:     Comments: Mild pitting edema of the bilateral lower extremities, symmetrical  Skin:    General: Skin is warm and dry.  Neurological:      General: No focal deficit present.     Mental Status: He is alert. Mental status is at baseline.  Psychiatric:        Mood and Affect: Mood normal.        Behavior: Behavior normal.     ED Results / Procedures / Treatments   Labs (all labs ordered are listed, but only abnormal results are displayed) Labs Reviewed  CBC - Abnormal; Notable for the following components:      Result Value   RBC 3.64 (*)    Hemoglobin 10.7 (*)    HCT 33.8 (*)    All other components within normal limits  BASIC METABOLIC PANEL - Abnormal; Notable for the following components:   Potassium 5.7 (*)    Chloride 116 (*)    CO2 20 (*)    Glucose, Bld 132 (*)    BUN 47 (*)    Creatinine, Ser 2.85 (*)    Calcium 8.5 (*)    GFR, Estimated 26 (*)    All other components within normal limits    EKG EKG Interpretation  Date/Time:  Wednesday February 23 2022 14:50:52 EST Ventricular Rate:  65 PR Interval:  167 QRS Duration: 94 QT Interval:  430 QTC Calculation: 448 R Axis:   24 Text Interpretation: Sinus rhythm Probable left atrial enlargement   Confirmed by Octaviano Glow [phone]) on 02/23/2022 3:26:01 PM  Radiology No results found.  Procedures Procedures    Medications Ordered in ED Medications  sodium zirconium cyclosilicate (LOKELMA) packet 10 g (10 g Oral Given 02/23/22 1643)  hydrALAZINE (APRESOLINE) tablet 50 mg (50 mg Oral Given 02/23/22 1643)  furosemide (LASIX) injection 40 mg (40 mg Intravenous Given 02/23/22 1648)  insulin aspart (novoLOG) injection 5 Units (5 Units Intravenous Given 02/23/22 1654)    And  dextrose 50 % solution 50 mL (50 mLs Intravenous Given 02/23/22 1649)  sodium chloride 0.9 % bolus 500 mL (500 mLs Intravenous New Bag/Given 02/23/22 1654)    ED Course/ Medical Decision Making/ A&P Clinical Course as of 02/23/22 1729  Wed Feb 23, 2022  1643 I spoke to Dr Jonnie Finner from nephrology who recommends Lokelma BID 10 mg x 4 days, PCP recheck of K on Friday.   Given that the potassium in fact this time for emergent medical admission as we would likely treat the same inpatient as well.  No indication for dialysis at this time.  I will provide further education and discharge instructions regarding a chronic kidney disease or renal diet,  [MT]    Clinical Course User Index [MT] Deira Shimer, Carola Rhine, MD  Medical Decision Making Risk OTC drugs. Prescription drug management.   This patient presents to the ED with concern for asymptomatic hyperkalemia. This involves an extensive number of treatment options, and is a complaint that carries with it a high risk of complications and morbidity.  The differential diagnosis includes iatrogenic hyperkalemia versus lab error versus chronic kidney disease versus other  Co-morbidities that complicate the patient evaluation: History of kidney disease and limited fluid intake at high risk of hyperkalemia and secondary problems of kidney disease  Additional history obtained from patient's wife  External records from outside source obtained and reviewed including hospital discharge summary Nov 2023, outpatient labs last 2 days  I ordered and personally interpreted labs.  The pertinent results include: Potassium 5.7, creatinine 2.8.  Overall these are improved from his levels yesterday, which makes me wonder about potential hemolysis on the outpatient labs.  The patient was maintained on a cardiac monitor.  I personally viewed and interpreted the cardiac monitored which showed an underlying rhythm of: NSR  Per my interpretation the patient's ECG shows sinus rhythm without acute hyperkalemic changes per my interpretation, no peaked T waves  I ordered medication for hyperkalemia, without acute EKG changes  I have reviewed the patients home medicines and have made adjustments as needed  I consulted with the nephrologist by phone, please see ED course  After the interventions noted above, I  reevaluated the patient and found that they have: improved  Social Determinants of Health: We discussed dietary changes to ensure a overall low potassium diet.  Information was also provided educationally for discharge  Dispostion:  After consideration of the diagnostic results and the patients response to treatment, I feel that the patent would benefit from close outpatient PCP follow-up for lab recheck..         Final Clinical Impression(s) / ED Diagnoses Final diagnoses:  Hyperkalemia    Rx / DC Orders ED Discharge Orders          Ordered    sodium zirconium cyclosilicate (LOKELMA) 10 g PACK packet  2 times daily        02/23/22 1726              Wyvonnia Dusky, MD 02/23/22 1729

## 2022-02-23 NOTE — Assessment & Plan Note (Signed)
Patient presents for a follow-up after admission for severe hyperkalemia with a potassium of 7.1 with EKG changes of peaked T waves.  He was given calcium gluconate and Lokelma during his stay and his potassium on discharge was 4.7.  He is felt very well since discharge and had no issues.  He has been trying to eat a low potassium diet and has taken Lasix 40 mg about 5 times over the past 2 weeks. - BMP today

## 2022-02-23 NOTE — ED Triage Notes (Signed)
Sent from doctors for elevated potassium. Pt w/o any complaints states he feels fine.

## 2022-02-23 NOTE — Assessment & Plan Note (Addendum)
Patient presents today after BMP yesterday showed a potassium of 6.1.  Repeat potassium today at 6.3.  EKG today with peaked T waves but otherwise a normal sinus rhythm and no P waves or PR interval changes.  Patient is asymptomatic as well.  Still hypertensive today at 168/77.  He was given a dose of Lokelma and sent to the [facility] long ED due to shorter wait time than here at [facility].  Patient left by personal vehicle without any symptoms.  [facility] long ED aware of patient.

## 2022-02-23 NOTE — Assessment & Plan Note (Addendum)
Patient with CKD stage IV with a GFR of 28 most likely secondary to chronic hypertension.  Follows with nephrology outpatient and will see them again on 03/15/2022.  Recently he has had severe hyperkalemia which he was hospitalized for. - BMP today - Continue to follow with nephrology

## 2022-02-23 NOTE — Assessment & Plan Note (Addendum)
Patient with new diagnosis of HFpEF with LVEF of 55 to 60% and severe LVH with grade 1 diastolic dysfunction.  Started GDMT last month with Imdur, carvedilol, Lasix, hydralazine, and amlodipine.  Unable to initiate ACE/ARB or spironolactone due to CKD stage IV.  Overall he he is doing well since recent hospitalization for hyperkalemia and has taken his as needed Lasix about one third of the time.  Today he appears euvolemic with just some trace lower extremity edema.  He has not had any limiting dyspnea and has been able to help clean out a storage room without issue.  During his hospitalization for new onset heart failure he was thought to be at risk of having COPD and was treated for this.  He was prescribed an albuterol inhaler as well as leave albuterol nebulizer solution.  He states that these have not helped with any of his dyspnea.  He is also stopped smoking since leaving the hospital.  We do not believe he has COPD but we will continue to monitor and not refill his inhalers at this time.  - Continue carvedilol 12.5 mg twice daily, Imdur 60 mg daily, Lasix 40 mg daily as needed, amlodipine 5 mg daily, and increase hydralazine to 50 mg 3 times daily - Consider adding SGLT2 at follow-up if renal function stable and GFR above 20

## 2022-02-23 NOTE — Progress Notes (Signed)
Patient was given Lokelma 10 mg powder.  [place] [id number]  Lot # [id number] Expires 09/11/2023.

## 2022-02-23 NOTE — Discharge Instructions (Addendum)
Please call your primary care clinic to set up repeat blood test by Monday the latest.  They need to recheck your potassium level and your kidney function.  In the meantime, our kidney doctor recommended that you take the [facility] medication twice a day for the next 4 days.  This medicine can help lower your potassium level.

## 2022-02-23 NOTE — Progress Notes (Addendum)
   CC: Follow-up hyperkalemia  HPI:  Samuel Gardner is a 51 y.o. male with PMH as below who presents to the clinic for an acute visit after labs showed he was hyperkalemic.  Please see encounters tab for problem-based charting.  Past Medical History:  Diagnosis Date   (HFpEF) heart failure with preserved ejection fraction (HCC)    CKD (chronic kidney disease), stage IV (HCC)    Diabetes mellitus without complication ([place])    Hypertension    Normocytic anemia    Review of Systems:   A comprehensive review of systems was negative.   Physical Exam:  Vitals:   02/23/22 1156 02/23/22 1204  BP: (!) 182/83 (!) 168/77  Pulse: 61 (!) 57  Temp: 98 F (36.7 C)   TempSrc: Oral   SpO2: 100%   Weight: 174 lb 6.4 oz (79.1 kg)   Height: 5\' 7"  (1.702 m)     Constitutional: Well-appearing middle-age male. In no acute distress. Cardio:Regular rate and rhythm.  Pulm:Clear to auscultation bilaterally. Normal work of breathing on room air. Neuro:Alert and oriented x3. No focal deficit noted. Psych:Pleasant mood and affect.   Assessment & Plan:   Hyperkalemia Patient presents today after BMP yesterday showed a potassium of 6.1.  Repeat potassium today at 6.3.  EKG today with peaked T waves but otherwise a normal sinus rhythm and no P waves or PR interval changes.  Patient is asymptomatic as well.  Still hypertensive today at 168/77.  He was given a dose of Lokelma and sent to the [facility] long ED due to shorter wait time than here at [facility].  Patient left by personal vehicle without any symptoms.  [facility] long ED aware of patient.    Patient seen with Dr. Tracey Harries, [place] Internal Medicine Resident PGY-1 Pager: [phone]

## 2022-02-23 NOTE — Assessment & Plan Note (Signed)
Patient has had elevated blood pressures that were difficult to control during hospitalization.  He was discharged on amlodipine 10 mg daily, hydralazine 25 mg 3 times daily, Imdur 30 mg daily, and carvedilol 12.5 mg twice daily.  Today his blood pressure is still very elevated at 187/96.  His blood pressures at home have been similar with systolics predominantly in the 160s.  His heart rate has been between 55 and 65 at home.  With his borderline heart rate we do not feel comfortable increasing his carvedilol at this point.  He was recently changed from amlodipine 10 mg to 5 mg due to lower extremity edema and at that time his Imdur was increased from 30 to 60 mg daily. - Increase hydralazine to 50 mg 3 times daily - Continue amlodipine 5 mg daily, Imdur 60 mg daily, and carvedilol 12.5 mg twice daily

## 2022-02-24 ENCOUNTER — Other Ambulatory Visit: Payer: Self-pay | Admitting: Student

## 2022-02-24 ENCOUNTER — Other Ambulatory Visit (HOSPITAL_COMMUNITY): Payer: Self-pay

## 2022-02-24 ENCOUNTER — Telehealth: Payer: Self-pay

## 2022-02-24 DIAGNOSIS — E875 Hyperkalemia: Secondary | ICD-10-CM

## 2022-02-24 MED ORDER — SODIUM POLYSTYRENE SULFONATE PO POWD
ORAL | 7 refills | Status: DC
  Filled 2022-02-24: qty 15, fill #0

## 2022-02-24 MED ORDER — SODIUM POLYSTYRENE SULFONATE PO POWD: Freq: Two times a day (BID) | ORAL | 0 refills | Status: AC

## 2022-02-24 NOTE — Telephone Encounter (Addendum)
Patients spouse Olin Hauser called regarding rx Lokelma, per Estill Bamberg the rx is $280 which she can't afford,Amanda is requesting a alternative medication at a affordable price.Estill Bamberg stated the pharmacy told her a rx for Lokelma sps can be sent in for a reasonable price as well. please return Amanda's phone call @ [phone]

## 2022-02-24 NOTE — Progress Notes (Signed)
Patient called stating that Samuel Gardner was too expensive to pick up at the pharmacy.  Does presently find was about $280.  Kayexalate was from as expensive and would be safe in this patient with a short period of time.  Prescription for Kayexalate 15 g twice daily for 4 days.

## 2022-02-24 NOTE — Progress Notes (Signed)
Internal Medicine Clinic Attending  I saw and evaluated the patient.  I personally confirmed the key portions of the history and exam documented by Dr. Nikki Dom and I reviewed pertinent patient test results.  The assessment, diagnosis, and plan were formulated together and I agree with the documentation in the resident's note.    Patient feels well and has been diligently taking all medicines since he was discharged from the hospital with hypertension & hyperkalemia. He was confused about the lasix dose to take, so had taken 20mg  of lasix as needed, rather than 40mg  as needed. Otherwise, med list is up to date. After the visit, K resulted at 6.1, so Dr. Nikki Dom called him to return for repeat labs and EKG. Unfortunately, K was up to 6.3 and EKG had some T wave changes, so patient was sent to the ER for further treatment. Per chart review, it looks like he was discharged with lokelma   We will arrange for him to come back to clinic in <1 week for repeat potassium & blood pressure check.

## 2022-02-28 ENCOUNTER — Other Ambulatory Visit: Payer: Self-pay | Admitting: *Deleted

## 2022-02-28 ENCOUNTER — Other Ambulatory Visit (INDEPENDENT_AMBULATORY_CARE_PROVIDER_SITE_OTHER): Payer: Self-pay

## 2022-02-28 ENCOUNTER — Other Ambulatory Visit: Payer: Self-pay | Admitting: Student

## 2022-02-28 DIAGNOSIS — I5032 Chronic diastolic (congestive) heart failure: Secondary | ICD-10-CM

## 2022-02-28 DIAGNOSIS — E875 Hyperkalemia: Secondary | ICD-10-CM

## 2022-02-28 LAB — BASIC METABOLIC PANEL
Anion gap: 6 (ref 5–15)
BUN: 39 mg/dL — ABNORMAL HIGH (ref 6–20)
CO2: 24 mmol/L (ref 22–32)
Calcium: 8.4 mg/dL — ABNORMAL LOW (ref 8.9–10.3)
Chloride: 106 mmol/L (ref 98–111)
Creatinine, Ser: 2.91 mg/dL — ABNORMAL HIGH (ref 0.61–1.24)
GFR, Estimated: 25 mL/min — ABNORMAL LOW (ref >60)
Glucose, Bld: 138 mg/dL — ABNORMAL HIGH (ref 70–99)
Potassium: 5.3 mmol/L — ABNORMAL HIGH (ref 3.5–5.1)
Sodium: 136 mmol/L (ref 135–145)

## 2022-02-28 MED ORDER — FUROSEMIDE 20 MG PO TABS: 40.0000 mg | ORAL_TABLET | Freq: Every day | ORAL | 0 refills | Status: DC | PRN

## 2022-02-28 NOTE — Telephone Encounter (Signed)
Patient here with wife for Stat BMP 2/2 hyperkalemia. States he has one dose of Kayexalate left and will take that today. He has been following low K+ diet, and maintaining fluid restrictions. Denies LE edema, SHOB, CP. Has been taking 2 tabs lasix (40 mg total) daily. Will need refill on this today. Reports one episode of dizziness this AM immediately after taking Kayexalate and standing from bar stool. Felt better after lying down. BP at time was 120/61. No dizziness at present. Please call wife's phone with results of BMP at [phone].

## 2022-02-28 NOTE — Progress Notes (Signed)
Called and spoke to pt's spouse, Samuel Gardner, discussed his improved potassium on the kayexalate. They have one dose of this medication left and they are also out of lasix. They have refills for the kayexalate and I will refill the lasix. We also discussed the need to continue monitoring his potassium with another lab draw in the near future. I will send a message to the clinic team to decide if he will need an appointment in addition to his lab and to decide on the best time to recheck his potassium.

## 2022-02-28 NOTE — Telephone Encounter (Signed)
BMP has resulted. K+ is 5.3 down from 6.3 on 02/23/22.

## 2022-03-09 NOTE — Progress Notes (Signed)
Internal Medicine Clinic Attending  I saw and evaluated the patient.  I personally confirmed the key portions of the history and exam documented by Dr. Goodwin and I reviewed pertinent patient test results.  The assessment, diagnosis, and plan were formulated together and I agree with the documentation in the resident's note.  

## 2022-03-15 ENCOUNTER — Telehealth: Payer: Self-pay | Admitting: Student

## 2022-03-15 NOTE — Telephone Encounter (Signed)
Pt's wife req a  call back about the pt's appt sch for tomorrow 03/16/2022 with [facility].  Pt's wife states he was just seen at the kidney Dr. and had all of his labs draw again and see no need to keep coming for the same thing.  She soul like to know why he has to come back to be seen .  Pt's wife is concerned about the cost of another appointment for the same labs the kidney Dr. Just did.  Please call back.

## 2022-03-15 NOTE — Telephone Encounter (Signed)
Pt 's wife stated pt saw his kidney this afternoon; labs were done, discussed his K+ . She stated pt is unemployed and they are trying to minimized any extra expense. She wants to know if we can wait on lab results before being seen at our office (03/16/22)?

## 2022-03-16 ENCOUNTER — Encounter: Payer: Self-pay | Admitting: Student

## 2022-03-16 ENCOUNTER — Telehealth: Payer: Self-pay | Admitting: Student

## 2022-03-16 DIAGNOSIS — I5032 Chronic diastolic (congestive) heart failure: Secondary | ICD-10-CM

## 2022-03-16 MED ORDER — FUROSEMIDE 40 MG PO TABS: 40.0000 mg | ORAL_TABLET | Freq: Every day | ORAL | 0 refills | Status: DC | PRN

## 2022-03-16 NOTE — Telephone Encounter (Signed)
Patient scheduled to follow up in [facility] 1/3 for his hypertension and hyperkalemia. He was seen at [facility] yesterday and they will manage his hyperkalemia with lokelma once weekly and lasix daily. To decrease number of appointments will cancel his visit with [place] today.  I did also discuss orange card documentation which patient and his wife are continuing to work on to help with visits and coverage moving forward.

## 2022-03-16 NOTE — Telephone Encounter (Signed)
I unfortunately cannot see nephrology notes but will call the patient's wife earlier this morning to discuss their afternoon appointment.

## 2022-03-24 ENCOUNTER — Other Ambulatory Visit (HOSPITAL_COMMUNITY): Payer: Self-pay

## 2022-04-06 ENCOUNTER — Encounter: Payer: Self-pay | Admitting: Cardiology

## 2022-04-06 ENCOUNTER — Ambulatory Visit: Payer: Self-pay | Attending: Cardiology | Admitting: Cardiology

## 2022-04-06 VITALS — BP 156/88 | HR 85 | Ht 67.0 in | Wt 178.2 lb

## 2022-04-06 DIAGNOSIS — I160 Hypertensive urgency: Secondary | ICD-10-CM

## 2022-04-06 DIAGNOSIS — I5032 Chronic diastolic (congestive) heart failure: Secondary | ICD-10-CM

## 2022-04-06 DIAGNOSIS — R7989 Other specified abnormal findings of blood chemistry: Secondary | ICD-10-CM

## 2022-04-06 DIAGNOSIS — N184 Chronic kidney disease, stage 4 (severe): Secondary | ICD-10-CM

## 2022-04-06 DIAGNOSIS — Z01818 Encounter for other preprocedural examination: Secondary | ICD-10-CM

## 2022-04-06 DIAGNOSIS — Z720 Tobacco use: Secondary | ICD-10-CM

## 2022-04-06 DIAGNOSIS — E782 Mixed hyperlipidemia: Secondary | ICD-10-CM

## 2022-04-06 DIAGNOSIS — R079 Chest pain, unspecified: Secondary | ICD-10-CM

## 2022-04-06 MED ORDER — CARVEDILOL 25 MG PO TABS
25.0000 mg | ORAL_TABLET | Freq: Two times a day (BID) | ORAL | 3 refills | Status: DC
  Filled 2022-04-11: qty 180, 90d supply, fill #0
  Filled 2022-06-21: qty 60, 30d supply, fill #1
  Filled 2022-08-04: qty 60, 30d supply, fill #2
  Filled 2022-09-03: qty 60, 30d supply, fill #3

## 2022-04-06 MED ORDER — CARVEDILOL 12.5 MG PO TABS: 12.5000 mg | ORAL_TABLET | Freq: Two times a day (BID) | ORAL | 11 refills | Status: DC

## 2022-04-06 NOTE — Addendum Note (Signed)
Addended by: Joni Reining on: 04/06/2022 02:35 PM   Modules accepted: Orders

## 2022-04-06 NOTE — Progress Notes (Signed)
Cardiology Office Note:    Date:  04/06/2022   ID:  Samuel Gardner, DOB 03/16/1970, MRN [id number]  PCP:  Johny Blamer, DO  Cardiologist:  Fransico Him, MD    Referring MD: [name]   Chief Complaint  Patient presents with   Chest Pain   Shortness of Breath   Congestive Heart Failure   Hypertension   Hyperlipidemia    History of Present Illness:    Samuel Gardner is a 52 y.o. male with a hx significant for hypertension, diabetes mellitus, acute congestive heart failure, CKD presents today for hospital follow-up appointment. Underlying COPD.    He was admitted 01/29/22 with shortness of breath and sweating 3 days prior to admission.  Legs also started swelling.  Earlier that day he was doing chores and developed central localized chest pains, shortness of breath and became diaphoretic.  He attributed the chest pain to a dry hacking cough he had developed the past 3 days.  EKG without acute ischemic changes.  Initial troponin 98, second troponin 81.  Chest x-ray without evidence of edema.  An echocardiogram was obtained which showed LVEF 55 to 60% with grade 1 DD. Cardiology was consulted.  Elevated troponin/chest pain/shortness of breath was possibly thought to be due to demand ischemia and hypertensive urgency.   He was seen back in the office by Nicholes Rough, NP 02/07/2022.  His diuretics were discontinued in the hospital due to his bump in creatinine.  Most recent creatinine from 11/22 was 3.23.  At that office visit he can plaint of 1-2+ pitting edema up to his knees.  He also endorsed shortness of breath and orthopnea.  He has used his albuterol inhaler due to his shortness of breath.  He does also endorsed wheezing when he is short of breath.   Ischemic work-up was not pursued inpatient since his chest pain was thought to be due to to demand ischemia in the setting of hypertension.  His Imdur was increased to 60 mg daily and amlodipine decreased to 5 mg daily due  to bilateral pitting lower extremity edema.  He was referred to nephrology due to his AKI on CKD  He was then hospitalized 11/29 and 12/13 for hyperkalemia and was seen by nephrology and is followed outpt by them.  His last SCr was 2.91 and K+ 5.3 on 12/18/234.   He is here today for followup and is doing well.  HE denies any chest pain or pressure, SOB, DOE, PND, orthopnea, LE edema, dizziness, palpitations or syncope. He is compliant with his meds and is tolerating meds with no SE.     Past Medical History:  Diagnosis Date   (HFpEF) heart failure with preserved ejection fraction (HCC)    CKD (chronic kidney disease), stage IV (HCC)    Diabetes mellitus without complication ([place])    Hypertension    Normocytic anemia     Past Surgical History:  Procedure Laterality Date   LEG SURGERY Right    teenager    Current Medications: Current Meds  Medication Sig   albuterol (VENTOLIN HFA) 108 (90 Base) MCG/ACT inhaler Inhale 2 puffs into the lungs every 6 (six) hours as needed for wheezing or shortness of breath.   amLODipine (NORVASC) 5 MG tablet Take 1 tablet (5 mg total) by mouth daily.   furosemide (LASIX) 40 MG tablet Take 1 tablet (40 mg total) by mouth daily as needed for fluid or edema (weight gain 3 lbs in one day or  5 pounds in a week).   hydrALAZINE (APRESOLINE) 50 MG tablet Take 1 tablet (50 mg total) by mouth 3 (three) times daily.   isosorbide mononitrate (IMDUR) 60 MG 24 hr tablet Take 1 tablet (60 mg total) by mouth daily.   levalbuterol (XOPENEX) 0.63 MG/3ML nebulizer solution Take 3 mLs (0.63 mg total) by nebulization every 6 (six) hours as needed for shortness of breath.   nitroGLYCERIN (NITROSTAT) 0.4 MG SL tablet Place 1 tablet (0.4 mg total) under the tongue every 5 (five) minutes as needed for chest pain.   sodium zirconium cyclosilicate (LOKELMA) 10 g PACK packet Take 10 g by mouth daily.   [DISCONTINUED] carvedilol (COREG) 12.5 MG tablet Take 1 tablet (12.5 mg total)  by mouth 2 (two) times daily with a meal.     Allergies:   Patient has no known allergies.   Social History   Socioeconomic History   Marital status: Single    Spouse name: Not on file   Number of children: Not on file   Years of education: Not on file   Highest education level: Not on file  Occupational History   Not on file  Tobacco Use   Smoking status: Former    Packs/day: 1.00    Types: Cigarettes    Quit date: 01/30/2022    Years since quitting: 0.1   Smokeless tobacco: Never  Substance and Sexual Activity   Alcohol use: Not Currently   Drug use: No   Sexual activity: Not on file  Other Topics Concern   Not on file  Social History Narrative   Not on file   Social Determinants of Health   Financial Resource Strain: Not on file  Food Insecurity: No Food Insecurity (02/23/2022)   Hunger Vital Sign    Worried About Running Out of Food in the Last Year: Never true    Ran Out of Food in the Last Year: Never true  Transportation Needs: No Transportation Needs (02/23/2022)   PRAPARE - Hydrologist (Medical): No    Lack of Transportation (Non-Medical): No  Physical Activity: Not on file  Stress: Not on file  Social Connections: Moderately Isolated (02/23/2022)   Social Connection and Isolation Panel [NHANES]    Frequency of Communication with Friends and Family: More than three times a week    Frequency of Social Gatherings with Friends and Family: More than three times a week    Attends Religious Services: Never    Marine scientist or Organizations: No    Attends Music therapist: Never    Marital Status: Married     Family History: The patient's family history includes Diabetes in his mother and sister; Hypertension in his mother and sister; Kidney disease in his mother.  ROS:   Please see the history of present illness.    ROS  All other systems reviewed and negative.   EKGs/Labs/Other Studies Reviewed:     The following studies were reviewed today: 2D echo  EKG:  EKG is not ordered today.    Recent Labs: 01/30/2022: B Natriuretic Peptide 758.7 02/07/2022: NT-Pro BNP 1,860 02/09/2022: ALT 30; Magnesium 2.3 02/23/2022: Hemoglobin 10.7; Platelets 309 02/28/2022: BUN 39; Creatinine, Ser 2.91; Potassium 5.3; Sodium 136   Recent Lipid Panel    Component Value Date/Time   CHOL 208 (H) 01/30/2022 0351   TRIG 106 01/30/2022 0351   HDL 55 01/30/2022 0351   CHOLHDL 3.8 01/30/2022 0351   VLDL 21 01/30/2022 0351  LDLCALC 132 (H) 01/30/2022 0351    CHA2DS2-VASc Score =   [ ] .  Therefore, the patient's annual risk of stroke is   %.           Physical Exam:    VS:  BP (!) 156/88   Pulse 85   Ht 5\' 7"  (1.702 m)   Wt 178 lb 3.2 oz (80.8 kg)   SpO2 98%   BMI 27.91 kg/m     Wt Readings from Last 3 Encounters:  04/06/22 178 lb 3.2 oz (80.8 kg)  02/23/22 174 lb 6.4 oz (79.1 kg)  02/22/22 175 lb 3.2 oz (79.5 kg)     GEN:  Well nourished, well developed in no acute distress HEENT: Normal NECK: No JVD; No carotid bruits LYMPHATICS: No lymphadenopathy CARDIAC: RRR, no murmurs, rubs, gallops RESPIRATORY:  Clear to auscultation without rales, wheezing or rhonchi  ABDOMEN: Soft, non-tender, non-distended MUSCULOSKELETAL:  No edema; No deformity  SKIN: Warm and dry NEUROLOGIC:  Alert and oriented x 3 PSYCHIATRIC:  Normal affect   ASSESSMENT:    1. Chronic diastolic CHF (congestive heart failure) (HCC)   2. Elevated troponin   3. Chest pain of uncertain etiology   4. Hypertensive urgency   5. Mixed hyperlipidemia   6. CKD (chronic kidney disease), stage IV ([place])   7. Tobacco abuse    PLAN:    In order of problems listed above:    Chronic diastolic CHF -He was having lower extremity edema at last office visit and his amlodipine was cut in half to 5 mg daily and he was referred to nephrology due to his worsening renal function with serum creatinine 3.2  -Nephrology is now  managing diuretics  -Cannot use SGLT2 I due to CKD   Elevated troponin/chest pain/shortness of breath -Thought to be due to demand ischemia from poorly controlled hypertension -EF 55 to 60% on 01/30/2022 -His Imdur was increased to 60 mg daily at last office visit -He certainly has cardiac risk factors including poorly controlled hypertension, remote tobacco abuse, hyperlipidemia and diabetes mellitus  -I have recommended proceeding with stress PET CT to rule out ischemia and this will allow [place] to get a coronary Ca score as well -Shared Decision Making/Informed Consent The risks [chest pain, shortness of breath, cardiac arrhythmias, dizziness, blood pressure fluctuations, myocardial infarction, stroke/transient ischemic attack, nausea, vomiting, allergic reaction, radiation exposure, metallic taste sensation and life-threatening complications (estimated to be 1 in 10,000)], benefits (risk stratification, diagnosing coronary artery disease, treatment guidance) and alternatives of a cardiac PET stress test were discussed in detail with Mr. Liew and he agrees to proceed.  Hypertensive urgency -BP improved in the office today -At last office visit amlodipine was decreased to 5 mg daily due to lower extremity edema -No ACE/ARB/ARNI/MRA due to CKD -Continue prescription drug management with amlodipine 5 mg daily, hydralazine 50 mg 3 times daily, Imdur 60 mg daily with as needed refills -increase Carvedilol 25mg  BID -I have asked him to check BP twice daily for a week and call with results   Hyperlipidemia -Most recent LDL was 132 -I have personally reviewed and interpreted outside labs performed by patient's PCP which showed LDL 132, HDL 55 on 01/30/2022 -If found to have coronary calcifications on his PET/CT will need to be started on statin therapy  CKD stage 4 -He is currently on a fluid restriction -Now being followed by nephrology who are managing diuretics and hyperkalemia   Tobacco  abuse -he has stopped   Time  Spent: 20 minutes total time of encounter, including 15 minutes spent in face-to-face patient care on the date of this encounter. This time includes coordination of care and counseling regarding above mentioned problem list. Remainder of non-face-to-face time involved reviewing chart documents/testing relevant to the patient encounter and documentation in the medical record. I have independently reviewed documentation from referring provider  Medication Adjustments/Labs and Tests Ordered: Current medicines are reviewed at length with the patient today.  Concerns regarding medicines are outlined above.  No orders of the defined types were placed in this encounter.  Meds ordered this encounter  Medications   carvedilol (COREG) 12.5 MG tablet    Sig: Take 1 tablet (12.5 mg total) by mouth 2 (two) times daily with a meal.    Dispense:  60 tablet    Refill:  11    Signed, Fransico Him, MD  04/06/2022 2:13 PM    [facility]

## 2022-04-06 NOTE — Patient Instructions (Signed)
Medication Instructions:  Stop taking carvedilol 12.5 mg twice daily. START taking 25 mg carvedilol twice daily. *If you need a refill on your cardiac medications before your next appointment, please call your pharmacy*   Lab Work: Please complete a BMET in our lab before leaving today.   If you have labs (blood work) drawn today and your tests are completely normal, you will receive your results only by: [place] (if you have MyChart) OR A paper copy in the mail If you have any lab test that is abnormal or we need to change your treatment, we will call you to review the results.   Testing/Procedures: How to Prepare for Your Cardiac PET/CT Stress Test:  1. Please do not take these medications before your test:   Medications that may interfere with the cardiac pharmacological stress agent (ex. nitrates - including erectile dysfunction medications, isosorbide mononitrate or beta-blockers) the day of the exam. (Erectile dysfunction medication should be held for at least 72 hrs prior to test) Theophylline containing medications for 12 hours. Dipyridamole 48 hours prior to the test. Your remaining medications may be taken with water.  2. Nothing to eat or drink, except water, 3 hours prior to arrival time.   NO caffeine/decaffeinated products, or chocolate 12 hours prior to arrival.  3. NO perfume, cologne or lotion  4. Total time is 1 to 2 hours; you may want to bring reading material for the waiting time.  5. Please report to Admitting at the [facility] Main Entrance 30 minutes early for your test.  [place], [place] [address]  Diabetic Preparation:  Hold oral medications. You may take NPH and Lantus insulin. Do not take Humalog or Humulin R (Regular Insulin) the day of your test. Check blood sugars prior to leaving the house. If able to eat breakfast prior to 3 hour fasting, you may take all medications, including your insulin, Do not worry if  you miss your breakfast dose of insulin - start at your next meal.  IF YOU THINK YOU MAY BE PREGNANT, OR ARE NURSING PLEASE INFORM THE TECHNOLOGIST.  In preparation for your appointment, medication and supplies will be purchased.  Appointment availability is limited, so if you need to cancel or reschedule, please call the Radiology Department at [phone]  24 hours in advance to avoid a cancellation fee of $100.00  What to Expect After you Arrive:  Once you arrive and check in for your appointment, you will be taken to a preparation room within the Radiology Department.  A technologist or Nurse will obtain your medical history, verify that you are correctly prepped for the exam, and explain the procedure.  Afterwards,  an IV will be started in your arm and electrodes will be placed on your skin for EKG monitoring during the stress portion of the exam. Then you will be escorted to the PET/CT scanner.  There, staff will get you positioned on the scanner and obtain a blood pressure and EKG.  During the exam, you will continue to be connected to the EKG and blood pressure machines.  A small, safe amount of a radioactive tracer will be injected in your IV to obtain a series of pictures of your heart along with an injection of a stress agent.    After your Exam:  It is recommended that you eat a meal and drink a caffeinated beverage to counter act any effects of the stress agent.  Drink plenty of fluids for the remainder  of the day and urinate frequently for the first couple of hours after the exam.  Your doctor will inform you of your test results within 7-10 business days.  For questions about your test or how to prepare for your test, please call: Marchia Bond, Cardiac Imaging Nurse Navigator  Gordy Clement, Cardiac Imaging Nurse Navigator Office: [phone]    Follow-Up: At [facility], you and your health needs are our priority.  As part of our continuing mission to provide you  with exceptional heart care, we have created designated Provider Care Teams.  These Care Teams include your primary Cardiologist (physician) and Advanced Practice Providers (APPs -  Physician Assistants and Nurse Practitioners) who all work together to provide you with the care you need, when you need it.  We recommend signing up for the patient portal called "MyChart".  Sign up information is provided on this After Visit Summary.  MyChart is used to connect with patients for Virtual Visits (Telemedicine).  Patients are able to view lab/test results, encounter notes, upcoming appointments, etc.  Non-urgent messages can be sent to your provider as well.   To learn more about what you can do with MyChart, go to [email or web address].    Your next appointment:   6 month(s)  Provider:   Fransico Him, MD     Other Instructions Please keep a record of your blood pressure for one week. Check your blood pressure one time in the morning 2 hours after taking any morning blood pressure medications. Then check your blood pressure again at dinner time. Write both values down with the date and time recorded, do this for one week, then call our office with results.

## 2022-04-07 LAB — BASIC METABOLIC PANEL
BUN/Creatinine Ratio: 16 (ref 9–20)
BUN: 46 mg/dL — ABNORMAL HIGH (ref 6–24)
CO2: 20 mmol/L (ref 20–29)
Calcium: 8.4 mg/dL — ABNORMAL LOW (ref 8.7–10.2)
Chloride: 106 mmol/L (ref 96–106)
Creatinine, Ser: 2.87 mg/dL — ABNORMAL HIGH (ref 0.76–1.27)
Glucose: 94 mg/dL (ref 70–99)
Potassium: 5.3 mmol/L — ABNORMAL HIGH (ref 3.5–5.2)
Sodium: 138 mmol/L (ref 134–144)
eGFR: 26 mL/min/{1.73_m2} — ABNORMAL LOW (ref >59)

## 2022-04-11 ENCOUNTER — Other Ambulatory Visit (HOSPITAL_COMMUNITY): Payer: Self-pay

## 2022-04-11 NOTE — Addendum Note (Signed)
Addended by: Fransico Him R on: 04/11/2022 03:06 PM   Modules accepted: Orders

## 2022-04-20 ENCOUNTER — Other Ambulatory Visit (HOSPITAL_COMMUNITY): Payer: Self-pay

## 2022-04-28 ENCOUNTER — Telehealth: Payer: Self-pay | Admitting: Cardiology

## 2022-04-28 NOTE — Telephone Encounter (Signed)
Pt's wife would mike a callback from nurse regarding requested BP readings. Please advise

## 2022-04-28 NOTE — Telephone Encounter (Signed)
Pts wife called in to report that pts BP readings:   Readings are in the morning 2 hours after talking his meds and at dinner after his 2nd dose of Hydralazine:  2/6  125/68 HR 56  weight 175 lbs   126/69 HR 53 2/7  119/55 HR 56 175 lbs   136/66 HR 56 2/8   121/65 57   110/58  52 2/9   122/60  54  172.8 lbs  124/70  50 2/10  119/68  49  173.8 lbs  125.68 50 2/11  122/60 53  173.8  127/68 54 2/12 116/60 57  173.8 lbs  126/54 56 2/13  120/65 54  175 lbs  124/54 58 2/14  118/58 63  174.8 lbs 126/63  58 2/15 106/58  58  175 lbs   She says that the pt has been fleeing well and still waiting to hear about a dater for his PET Scan.. I will forward to Dr Radford Pax and the nurse navigator.

## 2022-04-28 NOTE — Telephone Encounter (Signed)
Per Dr Radford Pax:   BPs look great    Pts wife advised.

## 2022-05-13 ENCOUNTER — Telehealth (HOSPITAL_COMMUNITY): Payer: Self-pay | Admitting: Emergency Medicine

## 2022-05-13 NOTE — Telephone Encounter (Signed)
Attempted to call patient regarding upcoming cardiac PET appointment. Left message on voicemail with name and callback number Marchia Bond RN Navigator [facility] [phone] Office [phone] Cell

## 2022-05-16 MED ORDER — REGADENOSON 0.4 MG/5ML IV SOLN: 0.4000 mg | Freq: Once | INTRAVENOUS | Status: DC

## 2022-05-16 MED ORDER — REGADENOSON 0.4 MG/5ML IV SOLN
0.4000 mg | Freq: Once | INTRAVENOUS | Status: AC
  Administered 2022-05-17: 0.4 mg via INTRAVENOUS

## 2022-05-17 ENCOUNTER — Ambulatory Visit (HOSPITAL_COMMUNITY)
Admission: RE | Admit: 2022-05-17 | Discharge: 2022-05-17 | Disposition: A | Discharge status: Home / Self Care | Payer: Self-pay | Source: Ambulatory Visit | Attending: Cardiology | Admitting: Cardiology

## 2022-05-17 DIAGNOSIS — R079 Chest pain, unspecified: Secondary | ICD-10-CM | POA: Insufficient documentation

## 2022-05-17 LAB — NM PET CT CARDIAC PERFUSION MULTI W/ABSOLUTE BLOODFLOW
MBFR: 1.6
Nuc Rest EF: 42 %
Nuc Stress EF: 47 %
Rest MBF: 0.94 ml/g/min
Rest Nuclear Isotope Dose: 20.5 mCi
ST Depression (mm): 0 mm
Stress MBF: 1.5 ml/g/min
Stress Nuclear Isotope Dose: 20.5 mCi
TID: 1.07

## 2022-05-17 MED ORDER — RUBIDIUM RB82 GENERATOR (RUBYFILL)
25.0000 | PACK | Freq: Once | INTRAVENOUS | Status: AC
  Administered 2022-05-17: 20.5 via INTRAVENOUS

## 2022-05-17 NOTE — Progress Notes (Signed)
Tolerated test well

## 2022-05-18 ENCOUNTER — Telehealth: Payer: Self-pay | Admitting: Internal Medicine

## 2022-05-18 ENCOUNTER — Telehealth: Payer: Self-pay

## 2022-05-18 DIAGNOSIS — I5032 Chronic diastolic (congestive) heart failure: Secondary | ICD-10-CM

## 2022-05-18 DIAGNOSIS — I2481 Acute coronary microvascular dysfunction: Secondary | ICD-10-CM

## 2022-05-18 MED ORDER — ISOSORBIDE MONONITRATE ER 30 MG PO TB24: 90.0000 mg | ORAL_TABLET | Freq: Every day | ORAL | 3 refills | Status: DC

## 2022-05-18 NOTE — Telephone Encounter (Signed)
error 

## 2022-05-18 NOTE — Telephone Encounter (Signed)
-----   Message from Werner Lean, MD sent at 05/18/2022  1:50 PM EST ----- Results: Evidence of coronary microvascular diseas Plan: Imdur to 90 mg, smoking cessation  Werner Lean, MD

## 2022-05-18 NOTE — Telephone Encounter (Signed)
LMTCB 05/18/22. No DPR on file, left message with no identifying information asking recipient to call the office.

## 2022-05-18 NOTE — Telephone Encounter (Signed)
Reviewed results with patient who verbalizes understanding of coronary microvascular disease. Discussed recommendations to stop smoking and to increase imdur to 90 mg. Patient agrees to plan, orders placed.

## 2022-05-19 ENCOUNTER — Other Ambulatory Visit (HOSPITAL_COMMUNITY): Payer: Self-pay

## 2022-05-19 MED ORDER — ISOSORBIDE MONONITRATE ER 30 MG PO TB24
90.0000 mg | ORAL_TABLET | Freq: Every day | ORAL | 3 refills | Status: DC
  Filled 2022-05-19: qty 90, 30d supply, fill #0
  Filled 2022-06-21: qty 90, 30d supply, fill #1
  Filled 2022-07-21: qty 90, 30d supply, fill #2
  Filled 2022-08-19: qty 90, 30d supply, fill #3

## 2022-05-19 NOTE — Telephone Encounter (Signed)
Patient called in and wanted results. Inform patient of results.

## 2022-05-19 NOTE — Addendum Note (Signed)
Addended by: Aris Georgia, Churchill Grimsley L on: 05/19/2022 12:15 PM   Modules accepted: Orders

## 2022-05-23 ENCOUNTER — Ambulatory Visit (INDEPENDENT_AMBULATORY_CARE_PROVIDER_SITE_OTHER): Payer: Self-pay | Admitting: Student

## 2022-05-23 ENCOUNTER — Other Ambulatory Visit (HOSPITAL_COMMUNITY): Payer: Self-pay

## 2022-05-23 ENCOUNTER — Encounter: Payer: Self-pay | Admitting: Student

## 2022-05-23 VITALS — BP 214/90 | HR 55 | Temp 98.0°F | Ht 67.0 in | Wt 183.3 lb

## 2022-05-23 DIAGNOSIS — Z720 Tobacco use: Secondary | ICD-10-CM

## 2022-05-23 DIAGNOSIS — Z87891 Personal history of nicotine dependence: Secondary | ICD-10-CM

## 2022-05-23 DIAGNOSIS — I5032 Chronic diastolic (congestive) heart failure: Secondary | ICD-10-CM

## 2022-05-23 DIAGNOSIS — I10 Essential (primary) hypertension: Secondary | ICD-10-CM

## 2022-05-23 DIAGNOSIS — E875 Hyperkalemia: Secondary | ICD-10-CM

## 2022-05-23 MED ORDER — FUROSEMIDE 40 MG PO TABS: 40.0000 mg | ORAL_TABLET | Freq: Every day | ORAL | 0 refills | Status: DC | PRN

## 2022-05-23 MED ORDER — LOKELMA 10 G PO PACK: 10.0000 g | PACK | Freq: Every day | ORAL | Status: DC

## 2022-05-23 MED ORDER — AMLODIPINE BESYLATE 5 MG PO TABS
5.0000 mg | ORAL_TABLET | Freq: Every day | ORAL | 3 refills | Status: DC
  Filled 2022-05-23: qty 30, 30d supply, fill #0
  Filled 2022-06-21: qty 30, 30d supply, fill #1
  Filled 2022-07-22: qty 30, 30d supply, fill #2
  Filled 2022-09-03: qty 30, 30d supply, fill #3

## 2022-05-23 MED ORDER — FUROSEMIDE 40 MG PO TABS: 40.0000 mg | ORAL_TABLET | Freq: Every day | ORAL | 0 refills | Status: DC

## 2022-05-23 NOTE — Assessment & Plan Note (Signed)
Assessment: Blood pressures remained elevated today. Prescribed regimen of amloidpine 5 mg daily, carvedilol 25 mg BID, hydralazine 50 mg TID, and imdur 90 mg (recently increased from 60). His amlodipine was decreased from 10 mg to 5 mg after lower extremity swelling.   He has not taking his second dose of hydralazine today nor his imdur. His BP was 197/90 and 214/90. Believe secondary to medication non-adherence. Well recorded home blood pressure readings are well within goal ranging from A999333 systolic.    Encouraged medication adherence and to bring in his blood pressure cuff to the next visit. He uses a newly bought brachial cuff.   Plan: - continue amlodipine 5 mg daily, hydralazine 50 mg TID, imdur 90 mg - if needed can increase hydralazine at next visit

## 2022-05-23 NOTE — Progress Notes (Signed)
CC: hypertension follow up  HPI:  Samuel Gardner is a 52 y.o. male living with a history stated below and presents today for hypertension follow up. Please see problem based assessment and plan for additional details.  Past Medical History:  Diagnosis Date   (HFpEF) heart failure with preserved ejection fraction (HCC)    CKD (chronic kidney disease), stage IV (HCC)    Diabetes mellitus without complication (HCC)    Hypertension    Normocytic anemia     Current Outpatient Medications on File Prior to Visit  Medication Sig Dispense Refill   albuterol (VENTOLIN HFA) 108 (90 Base) MCG/ACT inhaler Inhale 2 puffs into the lungs every 6 (six) hours as needed for wheezing or shortness of breath. 6.7 g 2   carvedilol (COREG) 25 MG tablet Take 1 tablet (25 mg total) by mouth 2 (two) times daily. 180 tablet 3   hydrALAZINE (APRESOLINE) 50 MG tablet Take 1 tablet (50 mg total) by mouth 3 (three) times daily. 90 tablet 2   isosorbide mononitrate (IMDUR) 30 MG 24 hr tablet Take 3 tablets (90 mg total) by mouth daily. 270 tablet 3   levalbuterol (XOPENEX) 0.63 MG/3ML nebulizer solution Take 3 mLs (0.63 mg total) by nebulization every 6 (six) hours as needed for shortness of breath. 3 mL 0   nitroGLYCERIN (NITROSTAT) 0.4 MG SL tablet Place 1 tablet (0.4 mg total) under the tongue every 5 (five) minutes as needed for chest pain. 25 tablet 3   No current facility-administered medications on file prior to visit.    Family History  Problem Relation Age of Onset   Diabetes Mother    Hypertension Mother    Kidney disease Mother    Diabetes Sister    Hypertension Sister     Social History   Socioeconomic History   Marital status: Single    Spouse name: Not on file   Number of children: Not on file   Years of education: Not on file   Highest education level: Not on file  Occupational History   Not on file  Tobacco Use   Smoking status: Former    Packs/day: 1.00    Types: Cigarettes     Quit date: 01/30/2022    Years since quitting: 0.3   Smokeless tobacco: Never  Substance and Sexual Activity   Alcohol use: Not Currently   Drug use: No   Sexual activity: Not on file  Other Topics Concern   Not on file  Social History Narrative   Not on file   Social Determinants of Health   Financial Resource Strain: Not on file  Food Insecurity: No Food Insecurity (02/23/2022)   Hunger Vital Sign    Worried About Running Out of Food in the Last Year: Never true    Ran Out of Food in the Last Year: Never true  Transportation Needs: No Transportation Needs (02/23/2022)   PRAPARE - Hydrologist (Medical): No    Lack of Transportation (Non-Medical): No  Physical Activity: Not on file  Stress: Not on file  Social Connections: Moderately Isolated (02/23/2022)   Social Connection and Isolation Panel [NHANES]    Frequency of Communication with Friends and Family: More than three times a week    Frequency of Social Gatherings with Friends and Family: More than three times a week    Attends Religious Services: Never    Marine scientist or Organizations: No    Attends Archivist Meetings:  Never    Marital Status: Married  Human resources officer Violence: Not At Risk (02/23/2022)   Humiliation, Afraid, Rape, and Kick questionnaire    Fear of Current or Ex-Partner: No    Emotionally Abused: No    Physically Abused: No    Sexually Abused: No    Review of Systems: ROS negative except for what is noted on the assessment and plan.  Vitals:   05/23/22 1517 05/23/22 1543  BP: (!) 197/90 (!) 214/90  Pulse: 63 (!) 55  Temp: 98 F (36.7 C)   TempSrc: Oral   SpO2: 100%   Weight: 183 lb 4.8 oz (83.1 kg)   Height: '5\' 7"'$  (1.702 m)     Physical Exam: Constitutional: well-appearing, in no acute distress HENT: normocephalic atraumatic Eyes: conjunctiva non-erythematous Neck: supple Cardiovascular: regular rate and rhythm, no m/r/g. No lower  extremity swelling Pulmonary/Chest: normal work of breathing on room air, lungs clear to auscultation bilaterally MSK: normal bulk and tone Neurological: alert & oriented x 3 Skin: warm and dry Psych: pleasant  Assessment & Plan:   Hyperkalemia Assessment: Follows with [facility], now takes lokelma daily. Potassium levels have been well managed.   Plan: - continue to follow with [facility] - lokelma 10 g daily  Tobacco use Assessment: Has not smoked cigarettes since hospitalization. Does not need nicotine supplementation or medications to help with cravings. Congratulated him on this  Plan: - continue smoking cessation  Essential hypertension Assessment: Blood pressures remained elevated today. Prescribed regimen of amloidpine 5 mg daily, carvedilol 25 mg BID, hydralazine 50 mg TID, and imdur 90 mg (recently increased from 60). His amlodipine was decreased from 10 mg to 5 mg after lower extremity swelling.   He has not taking his second dose of hydralazine today nor his imdur. His BP was 197/90 and 214/90. Believe secondary to medication non-adherence. Well recorded home blood pressure readings are well within goal ranging from A999333 systolic.    Encouraged medication adherence and to bring in his blood pressure cuff to the next visit. He uses a newly bought brachial cuff.   Plan: - continue amlodipine 5 mg daily, hydralazine 50 mg TID, imdur 90 mg - if needed can increase hydralazine at next visit  Patient discussed with Dr. Laurena Slimmer, D.O. [facility], PGY-3 Phone: [phone] Date 05/23/2022 Time 7:59 PM

## 2022-05-23 NOTE — Assessment & Plan Note (Signed)
Assessment: Follows with [facility], now takes lokelma daily. Potassium levels have been well managed.   Plan: - continue to follow with [facility] - lokelma 10 g daily

## 2022-05-23 NOTE — Patient Instructions (Signed)
Thank you, [name] for allowing [place] to provide your care today. Today we discussed .  Hypertension Please continue to take your medicines. Come back in 1 month and bring in your blood pressure cuff    I have ordered the following labs for you:  Lab Orders  No laboratory test(s) ordered today    Referrals ordered today:   Referral Orders  No referral(s) requested today     I have ordered the following medication/changed the following medications:   Stop the following medications: Medications Discontinued During This Encounter  Medication Reason   furosemide (LASIX) 40 MG tablet Reorder   sodium zirconium cyclosilicate (LOKELMA) 10 g PACK packet Reorder   furosemide (LASIX) 40 MG tablet Reorder   amLODipine (NORVASC) 5 MG tablet Reorder     Start the following medications: Meds ordered this encounter  Medications   DISCONTD: furosemide (LASIX) 40 MG tablet    Sig: Take 1 tablet (40 mg total) by mouth daily as needed for fluid or edema (weight gain 3 lbs in one day or 5 pounds in a week).    Dispense:  30 tablet    Refill:  0   sodium zirconium cyclosilicate (LOKELMA) 10 g PACK packet    Sig: Take 10 g by mouth daily.   furosemide (LASIX) 40 MG tablet    Sig: Take 1 tablet (40 mg total) by mouth daily.    Dispense:  30 tablet    Refill:  0   amLODipine (NORVASC) 5 MG tablet    Sig: Take 1 tablet (5 mg total) by mouth daily.    Dispense:  90 tablet    Refill:  3    [facility] clinic     Follow up:  1 month    Should you have any questions or concerns please call the internal medicine clinic at [phone].    Sanjuana Letters, D.O. [facility]

## 2022-05-23 NOTE — Assessment & Plan Note (Signed)
Assessment: Has not smoked cigarettes since hospitalization. Does not need nicotine supplementation or medications to help with cravings. Congratulated him on this  Plan: - continue smoking cessation

## 2022-05-24 MED FILL — Regadenoson IV Inj 0.4 MG/5ML (0.08 MG/ML): INTRAVENOUS | Qty: 5 | Status: AC

## 2022-05-24 NOTE — Progress Notes (Signed)
Internal Medicine Clinic Attending  Case discussed with Dr. [name]  At the time of the visit.  We reviewed the resident's history and exam and pertinent patient test results.  I agree with the assessment, diagnosis, and plan of care documented in the resident's note.  

## 2022-05-24 NOTE — Addendum Note (Signed)
Addended by: Riesa Pope on: 05/24/2022 09:03 AM   Modules accepted: Level of Service

## 2022-06-21 ENCOUNTER — Other Ambulatory Visit: Payer: Self-pay | Admitting: Student

## 2022-06-21 DIAGNOSIS — I10 Essential (primary) hypertension: Secondary | ICD-10-CM

## 2022-06-22 ENCOUNTER — Other Ambulatory Visit (HOSPITAL_COMMUNITY): Payer: Self-pay

## 2022-06-22 DIAGNOSIS — I5032 Chronic diastolic (congestive) heart failure: Secondary | ICD-10-CM | POA: Diagnosis not present

## 2022-06-22 DIAGNOSIS — E875 Hyperkalemia: Secondary | ICD-10-CM | POA: Diagnosis not present

## 2022-06-22 DIAGNOSIS — I129 Hypertensive chronic kidney disease with stage 1 through stage 4 chronic kidney disease, or unspecified chronic kidney disease: Secondary | ICD-10-CM | POA: Diagnosis not present

## 2022-06-22 DIAGNOSIS — N184 Chronic kidney disease, stage 4 (severe): Secondary | ICD-10-CM | POA: Diagnosis not present

## 2022-06-22 DIAGNOSIS — E1122 Type 2 diabetes mellitus with diabetic chronic kidney disease: Secondary | ICD-10-CM | POA: Diagnosis not present

## 2022-06-22 MED ORDER — HYDRALAZINE HCL 50 MG PO TABS
50.0000 mg | ORAL_TABLET | Freq: Three times a day (TID) | ORAL | 2 refills | Status: DC
  Filled 2022-06-22 – 2022-06-23 (×2): qty 90, 30d supply, fill #0

## 2022-06-23 ENCOUNTER — Other Ambulatory Visit: Payer: Self-pay

## 2022-06-23 ENCOUNTER — Other Ambulatory Visit (HOSPITAL_COMMUNITY): Payer: Self-pay

## 2022-06-24 ENCOUNTER — Emergency Department (HOSPITAL_COMMUNITY): Payer: 59

## 2022-06-24 ENCOUNTER — Emergency Department (HOSPITAL_COMMUNITY)
Admission: EM | Admit: 2022-06-24 | Discharge: 2022-06-24 | Disposition: A | Discharge status: Home / Self Care | Payer: 59 | Attending: Emergency Medicine | Admitting: Emergency Medicine

## 2022-06-24 ENCOUNTER — Other Ambulatory Visit: Payer: Self-pay

## 2022-06-24 ENCOUNTER — Encounter (HOSPITAL_COMMUNITY): Payer: Self-pay

## 2022-06-24 DIAGNOSIS — N189 Chronic kidney disease, unspecified: Secondary | ICD-10-CM | POA: Diagnosis not present

## 2022-06-24 DIAGNOSIS — Z79899 Other long term (current) drug therapy: Secondary | ICD-10-CM | POA: Insufficient documentation

## 2022-06-24 DIAGNOSIS — R0602 Shortness of breath: Secondary | ICD-10-CM

## 2022-06-24 DIAGNOSIS — I10 Essential (primary) hypertension: Secondary | ICD-10-CM

## 2022-06-24 DIAGNOSIS — I130 Hypertensive heart and chronic kidney disease with heart failure and stage 1 through stage 4 chronic kidney disease, or unspecified chronic kidney disease: Secondary | ICD-10-CM | POA: Insufficient documentation

## 2022-06-24 DIAGNOSIS — I509 Heart failure, unspecified: Secondary | ICD-10-CM | POA: Insufficient documentation

## 2022-06-24 DIAGNOSIS — J90 Pleural effusion, not elsewhere classified: Secondary | ICD-10-CM | POA: Diagnosis not present

## 2022-06-24 DIAGNOSIS — J9811 Atelectasis: Secondary | ICD-10-CM | POA: Diagnosis not present

## 2022-06-24 LAB — CBC WITH DIFFERENTIAL/PLATELET
Abs Immature Granulocytes: 0.03 10*3/uL (ref 0.00–0.07)
Basophils Absolute: 0.1 10*3/uL (ref 0.0–0.1)
Basophils Relative: 1 %
Eosinophils Absolute: 0.1 10*3/uL (ref 0.0–0.5)
Eosinophils Relative: 2 %
HCT: 29.7 % — ABNORMAL LOW (ref 39.0–52.0)
Hemoglobin: 9.4 g/dL — ABNORMAL LOW (ref 13.0–17.0)
Immature Granulocytes: 0 %
Lymphocytes Relative: 13 %
Lymphs Abs: 1 10*3/uL (ref 0.7–4.0)
MCH: 27.6 pg (ref 26.0–34.0)
MCHC: 31.6 g/dL (ref 30.0–36.0)
MCV: 87.1 fL (ref 80.0–100.0)
Monocytes Absolute: 1 10*3/uL (ref 0.1–1.0)
Monocytes Relative: 14 %
Neutro Abs: 5.1 10*3/uL (ref 1.7–7.7)
Neutrophils Relative %: 70 %
Platelets: 248 10*3/uL (ref 150–400)
RBC: 3.41 MIL/uL — ABNORMAL LOW (ref 4.22–5.81)
RDW: 13.6 % (ref 11.5–15.5)
WBC: 7.2 10*3/uL (ref 4.0–10.5)
nRBC: 0 % (ref 0.0–0.2)

## 2022-06-24 LAB — BASIC METABOLIC PANEL
Anion gap: 5 (ref 5–15)
BUN: 38 mg/dL — ABNORMAL HIGH (ref 6–20)
CO2: 22 mmol/L (ref 22–32)
Calcium: 8 mg/dL — ABNORMAL LOW (ref 8.9–10.3)
Chloride: 108 mmol/L (ref 98–111)
Creatinine, Ser: 3.04 mg/dL — ABNORMAL HIGH (ref 0.61–1.24)
GFR, Estimated: 24 mL/min — ABNORMAL LOW (ref >60)
Glucose, Bld: 127 mg/dL — ABNORMAL HIGH (ref 70–99)
Potassium: 3.9 mmol/L (ref 3.5–5.1)
Sodium: 135 mmol/L (ref 135–145)

## 2022-06-24 LAB — BRAIN NATRIURETIC PEPTIDE: B Natriuretic Peptide: 368.5 pg/mL — ABNORMAL HIGH (ref 0.0–100.0)

## 2022-06-24 LAB — TROPONIN I (HIGH SENSITIVITY): Troponin I (High Sensitivity): 50 ng/L — ABNORMAL HIGH (ref <18)

## 2022-06-24 MED ORDER — HYDRALAZINE HCL 20 MG/ML IJ SOLN
10.0000 mg | Freq: Once | INTRAMUSCULAR | Status: AC
  Administered 2022-06-24: 10 mg via INTRAVENOUS
  Filled 2022-06-24: qty 1

## 2022-06-24 NOTE — Discharge Instructions (Addendum)
Your lab work overall looks good today.  Your kidney function is close to baseline and your potassium is normal.  Your blood pressure is improved after treatment here.  There was some slight signs of heart failure on your chest x-ray, but your lungs are clear on exam.  Given that you are feeling well, your oxygen level is normal, we feel that you can go home tonight.  Please follow-up with your doctor next week for recheck.  Return to the emergency department if you develop chest pain, worsening shortness of breath, if you pass out or have any other concerns.

## 2022-06-24 NOTE — ED Provider Notes (Signed)
[place] EMERGENCY DEPARTMENT AT [facility] Provider Note   CSN: [id number] Arrival date & time: 06/24/22  1625     History  Chief Complaint  Patient presents with   SOB    Samuel Gardner is a 52 y.o. male.  Patient presents to the emergency department today for evaluation of shortness of breath.  Patient has a history of chronic kidney disease, hypertension, congestive heart failure.  Wife at bedside contributes to history.  States that around 2:00 today patient had acute onset of shortness of breath and sweating.  He was having difficulty catching his breath.  No associated chest pain or chest tightness.  Blood pressure was checked at home and found to be 190s systolic.  Symptoms gradually improved and well in the waiting room, he feels back to his normal self.  No lightheadedness or syncope.  Lower extremity swelling not present.  Recently increased Lasix from 40 mg daily to 40 mg twice a day at the instruction of his nephrologist.  He has been compliant with his medications.  No fevers or cough.  For hypertension, also takes amlodipine, Imdur, carvedilol.       Home Medications Prior to Admission medications   Medication Sig Start Date End Date Taking? Authorizing Provider  albuterol (VENTOLIN HFA) 108 (90 Base) MCG/ACT inhaler Inhale 2 puffs into the lungs every 6 (six) hours as needed for wheezing or shortness of breath. 02/02/22   Leatha Gilding, MD  amLODipine (NORVASC) 5 MG tablet Take 1 tablet (5 mg total) by mouth daily. 05/23/22   Belva Agee, MD  carvedilol (COREG) 25 MG tablet Take 1 tablet (25 mg total) by mouth 2 (two) times daily. 04/06/22   Quintella Reichert, MD  furosemide (LASIX) 40 MG tablet Take 1 tablet (40 mg total) by mouth daily. 05/23/22 06/22/22  Belva Agee, MD  hydrALAZINE (APRESOLINE) 50 MG tablet Take 1 tablet (50 mg total) by mouth 3 (three) times daily. 06/22/22   Rocky Morel, DO  isosorbide mononitrate (IMDUR) 30 MG  24 hr tablet Take 3 tablets (90 mg total) by mouth daily. 05/19/22   [name], Rondel Jumbo, MD  levalbuterol (XOPENEX) 0.63 MG/3ML nebulizer solution Take 3 mLs (0.63 mg total) by nebulization every 6 (six) hours as needed for shortness of breath. 02/07/22   Sharlene Dory, [name]  nitroGLYCERIN (NITROSTAT) 0.4 MG SL tablet Place 1 tablet (0.4 mg total) under the tongue every 5 (five) minutes as needed for chest pain. 02/07/22   Sharlene Dory, [name]  sodium zirconium cyclosilicate (LOKELMA) 10 g PACK packet Take 10 g by mouth daily. 05/23/22   Belva Agee, MD      Allergies    Patient has no known allergies.    Review of Systems   Review of Systems  Physical Exam Updated Vital Signs BP (!) 194/101 (BP Location: Right Arm)   Pulse 63   Temp 98.1 F (36.7 C) (Oral)   Resp 16   Ht  (1.702 m)   Wt 85.3 kg   SpO2 100%   BMI 29.44 kg/m   Physical Exam Vitals and nursing note reviewed.  Constitutional:      Appearance: He is well-developed. He is not diaphoretic.  HENT:     Head: Normocephalic and atraumatic.     Nose: Nose normal.     Mouth/Throat:     Mouth: Mucous membranes are moist. Mucous membranes are not dry.  Eyes:     Conjunctiva/sclera: Conjunctivae normal.  Neck:  Vascular: Normal carotid pulses. No carotid bruit or JVD.     Trachea: Trachea normal. No tracheal deviation.  Cardiovascular:     Rate and Rhythm: Normal rate and regular rhythm.     Pulses: No decreased pulses.          Radial pulses are 2+ on the right side and 2+ on the left side.     Heart sounds: Normal heart sounds, S1 normal and S2 normal. Heart sounds not distant. No murmur heard. Pulmonary:     Effort: Pulmonary effort is normal. No respiratory distress.     Breath sounds: Normal breath sounds. No wheezing.     Comments: Patient comfortable, no crackles or wheezing Chest:     Chest wall: No tenderness.  Abdominal:     General: Bowel sounds are normal.     Palpations: Abdomen  is soft.     Tenderness: There is no abdominal tenderness. There is no guarding or rebound.  Musculoskeletal:     Cervical back: Normal range of motion and neck supple. No muscular tenderness.     Right lower leg: Edema (Trace) present.     Left lower leg: Edema (Trace) present.  Skin:    General: Skin is warm and dry.     Coloration: Skin is not pale.  Neurological:     Mental Status: He is alert. Mental status is at baseline.  Psychiatric:        Mood and Affect: Mood normal.     ED Results / Procedures / Treatments   Labs (all labs ordered are listed, but only abnormal results are displayed) Labs Reviewed  CBC WITH DIFFERENTIAL/PLATELET - Abnormal; Notable for the following components:      Result Value   RBC 3.41 (*)    Hemoglobin 9.4 (*)    HCT 29.7 (*)    All other components within normal limits  BASIC METABOLIC PANEL - Abnormal; Notable for the following components:   Glucose, Bld 127 (*)    BUN 38 (*)    Creatinine, Ser 3.04 (*)    Calcium 8.0 (*)    GFR, Estimated 24 (*)    All other components within normal limits  BRAIN NATRIURETIC PEPTIDE - Abnormal; Notable for the following components:   B Natriuretic Peptide 368.5 (*)    All other components within normal limits  TROPONIN I (HIGH SENSITIVITY)    ED ECG REPORT   Date: 06/24/2022  Rate: 58  Rhythm: normal sinus rhythm  QRS Axis: normal  Intervals: normal  ST/T Wave abnormalities: nonspecific T wave changes  Conduction Disutrbances:none  Narrative Interpretation:   Old EKG Reviewed: unchanged from 02/2022  I have personally reviewed the EKG tracing and agree with the computerized printout as noted.   Radiology DG Chest 2 View  Result Date: 06/24/2022 CLINICAL DATA:  Shortness of breath. EXAM: CHEST - 2 VIEW COMPARISON:  Radiograph 01/29/2022. FINDINGS: Mild cardiomegaly. Stable mediastinal contours. Small pleural effusions and fluid in the fissures. There is vascular congestion. Mild bibasilar  atelectasis without confluent airspace disease. No pneumothorax. IMPRESSION: Mild cardiomegaly with vascular congestion and small pleural effusions, suspicious for CHF. Electronically Signed   By: Narda Rutherford M.D.   On: 06/24/2022 17:27    Procedures Procedures    Medications Ordered in ED Medications  hydrALAZINE (APRESOLINE) injection 10 mg (10 mg Intravenous Given 06/24/22 2045)    ED Course/ Medical Decision Making/ A&P    Patient seen and examined. History obtained directly from patient. Work-up including labs,  imaging, EKG ordered in triage, if performed, were reviewed.    Labs/EKG: Independently reviewed and interpreted.  This included: CBC with hemoglobin 9.4 which is slightly lower than his baseline in the tens, normal white blood cell count; BMP creatinine is 3.04 in setting of chronic kidney disease, BUN of 38, normal electrolytes including potassium; BNP 368 which is lower than when he was admitted last year.  I would like to check cardiac enzyme given reported episode of shortness of breath with diaphoresis.   Imaging: Independently visualized and interpreted.  This included: Chest x-ray agree mild venous congestion  Medications/Fluids: Ordered: IV hydralazine   Most recent vital signs reviewed and are as follows: BP (!) 194/101 (BP Location: Right Arm)   Pulse 63   Temp 98.1 F (36.7 C) (Oral)   Resp 16   Ht 5\' 7"  (1.702 m)   Wt 85.3 kg   SpO2 100%   BMI 29.44 kg/m   Initial impression: Shortness of breath in setting of CKD and CHF history.  Symptoms have resolved at this time.  Blood pressure remains elevated.  Will treat with IV hydralazine.  Will check troponin and reassess.  Lungs are clear at time of exam.  Patient is breathing normally with oxygen saturation 100%.  11:22 PM Reassessment performed. Patient appears stable.  His breathing remains at baseline.  Blood pressures have improved after 1 dose of IV hydralazine.  Labs personally reviewed and  interpreted including: Troponin is at 50.  I suspect that this is near normal for the patient given his CKD and CHF history.  Previous troponins at previous admission were 81 and 90.  If patient had active ACS I would expect his troponin to be much higher.  Reviewed pertinent lab work and imaging with patient at bedside. Questions answered.   Most current vital signs reviewed and are as follows: BP (!) 157/80   Pulse 65   Temp 98.1 F (36.7 C) (Oral)   Resp 17   Ht 5\' 7"  (1.702 m)   Wt 85.3 kg   SpO2 100%   BMI 29.44 kg/m   Plan: Discharge to home.  Discussed discharge with both patient and wife at bedside.  They are both comfortable with this.    Prescriptions written for: None  Other home care instructions discussed: Rest, continue medications.  ED return instructions discussed: Return with worsening shortness of breath, persistent chest pain, fevers, new symptoms or other concerns.  Follow-up instructions discussed: Patient encouraged to follow-up with their PCP in 3-5 days.                             Medical Decision Making Risk Prescription drug management.   Patient presents with episode of shortness of breath earlier today.  He did not have chest pain.  Symptoms have resolved.  He did have some diaphoresis.  EKG is stable without new ischemic findings.  Troponin is at 50 however less than when he was admitted last year and I suspect that this is chronic and due to CKD, CHF and elevated blood pressures.  BNP is also slightly elevated but improved from prior.  Low concern for ACS/MI at this time.  Low concern for PE.  Patient does have some vascular congestion on x-ray, however lungs are clear to auscultation on exam and patient is not hypoxic.  Blood pressure improved with IV hydralazine in the ED.  He looks very well.  At this point, no indications  for admission.  The patient's vital signs, pertinent lab work and imaging were reviewed and interpreted as discussed in the ED  course. Hospitalization was considered for further testing, treatments, or serial exams/observation. However as patient is well-appearing, has a stable exam, and reassuring studies today, I do not feel that they warrant admission at this time. This plan was discussed with the patient who verbalizes agreement and comfort with this plan and seems reliable and able to return to the Emergency Department with worsening or changing symptoms.          Final Clinical Impression(s) / ED Diagnoses Final diagnoses:  Shortness of breath  Hypertension, unspecified type    Rx / DC Orders ED Discharge Orders     None         Renne Crigler, Cordelia Poche 06/24/22 2326    Wynetta Fines, MD 06/24/22 [phone]

## 2022-06-24 NOTE — ED Provider Triage Note (Signed)
Emergency Medicine Provider Triage Evaluation Note  Samuel Gardner , a 52 y.o. male  was evaluated in triage.  Patient complains of shortness of breath that has been increasing over the past couple of days.  History of heart failure.  Recently saw outpatient specialist who took his Lasix from 20 twice daily to 40 twice daily.  Says that he continues to have some dyspnea as well as peripheral edema.  Denies any chest pain.  Does endorse some occasional lightheadedness.  No history of DVT/PE, recent travel, recent surgery, chest pain or cough  Review of Systems  Positive:  Negative:   Physical Exam  BP (!) 171/98   Pulse (!) 58   Temp 98 F (36.7 C)   Resp (!) 23   Ht 5\' 7"  (1.702 m)   Wt 85.3 kg   SpO2 95%   BMI 29.44 kg/m  Gen:   Awake, no distress   Resp:  Normal effort  MSK:   Moves extremities without difficulty  Other:  No appreciable pitting edema.  Lung sounds clear.  Patient is ambulatory, normal work of breathing on my exam  Medical Decision Making  Medically screening exam initiated at 5:04 PM.  Appropriate orders placed.  Samuel Gardner was informed that the remainder of the evaluation will be completed by another provider, this initial triage assessment does not replace that evaluation, and the importance of remaining in the ED until their evaluation is complete.     Saddie Benders, [name] 06/24/22 1705

## 2022-06-24 NOTE — ED Triage Notes (Signed)
Pt came in vis POV d/t difficulty breathing & HTN 198/88 at home BP meds taken before & it was still high. A/Ox4, denies pain & dizziness, endorse chest tightness & sweating.

## 2022-07-06 ENCOUNTER — Other Ambulatory Visit (HOSPITAL_COMMUNITY): Payer: Self-pay

## 2022-07-06 ENCOUNTER — Ambulatory Visit (INDEPENDENT_AMBULATORY_CARE_PROVIDER_SITE_OTHER): Payer: 59

## 2022-07-06 VITALS — BP 164/83 | HR 55 | Temp 98.2°F | Ht 67.0 in | Wt 184.7 lb

## 2022-07-06 DIAGNOSIS — I110 Hypertensive heart disease with heart failure: Secondary | ICD-10-CM | POA: Diagnosis not present

## 2022-07-06 DIAGNOSIS — I5032 Chronic diastolic (congestive) heart failure: Secondary | ICD-10-CM

## 2022-07-06 DIAGNOSIS — I10 Essential (primary) hypertension: Secondary | ICD-10-CM

## 2022-07-06 DIAGNOSIS — Z5320 Procedure and treatment not carried out because of patient's decision for unspecified reasons: Secondary | ICD-10-CM | POA: Insufficient documentation

## 2022-07-06 MED ORDER — FUROSEMIDE 40 MG PO TABS: 40.0000 mg | ORAL_TABLET | Freq: Two times a day (BID) | ORAL | 0 refills | Status: DC

## 2022-07-06 MED ORDER — HYDRALAZINE HCL 50 MG PO TABS
75.0000 mg | ORAL_TABLET | Freq: Three times a day (TID) | ORAL | 2 refills | Status: DC
  Filled 2022-07-06 – 2022-07-21 (×2): qty 90, 20d supply, fill #0
  Filled 2022-08-04 – 2022-08-09 (×3): qty 90, 20d supply, fill #1

## 2022-07-06 NOTE — Assessment & Plan Note (Addendum)
Patient presents for follow up of his hypertension.  Blood pressure elevated to 190s to 200s systolic at last follow-up the patient not taken all of his medications.  Reported that his blood pressures were in the 120 systolic at home prior to that.  Today, he endorses home pressures 120s to 140s systolic.  Blood pressure elevated today at 164/83.  He denies headaches, vision changes, shortness of breath, or chest pain. -Increase hydralazine to 75 mg 3 times daily -Otherwise continue previous regimen

## 2022-07-06 NOTE — Progress Notes (Signed)
   CC: BP recheck  HPI:  [name] is a 52 y.o. with past medical history as below who presents for blood pressure recheck. Please see detailed assessment and plan for HPI.  Past Medical History:  Diagnosis Date   (HFpEF) heart failure with preserved ejection fraction    CKD (chronic kidney disease), stage IV    Diabetes mellitus without complication    Hypertension    Normocytic anemia    Review of Systems:  See detailed assessment and plan for pertinent ROS.  Physical Exam:  Vitals:   07/06/22 1418 07/06/22 1436  BP: (!) 165/81 (!) 164/83  Pulse: (!) 57 (!) 55  Temp: 98.2 F (36.8 C)   TempSrc: Oral   SpO2: 98%   Weight: 184 lb 11.2 oz (83.8 kg)   Height:  (1.702 m)    Physical Exam Constitutional:      General: He is not in acute distress. HENT:     Head: Normocephalic and atraumatic.  Eyes:     Extraocular Movements: Extraocular movements intact.  Cardiovascular:     Rate and Rhythm: Normal rate and regular rhythm.     Heart sounds: No murmur heard. Pulmonary:     Effort: Pulmonary effort is normal.     Breath sounds: No wheezing, rhonchi or rales.  Musculoskeletal:     Cervical back: Neck supple.     Right lower leg: No edema.     Left lower leg: No edema.  Skin:    General: Skin is warm and dry.  Neurological:     Mental Status: He is alert and oriented to person, place, and time.  Psychiatric:        Mood and Affect: Mood normal.        Behavior: Behavior normal.      Assessment & Plan:   See Encounters Tab for problem based charting.  Colon cancer screening declined Patient is due for colon cancer screening. He has not had one. Discussed benefits. He would like to think about it and revisit at next appointment. -discuss colonoscopy again at follow up. If still not interested, could consider home FOBT.  Essential hypertension Patient presents for follow up of his hypertension.  Blood pressure elevated to 190s to 200s systolic  at last follow-up the patient not taken all of his medications.  Reported that his blood pressures were in the 120 systolic at home prior to that.  Today, he endorses home pressures 120s to 140s systolic.  Blood pressure elevated today at 164/83.  He denies headaches, vision changes, shortness of breath, or chest pain. -Increase hydralazine to 75 mg 3 times daily -Otherwise continue previous regimen  Patient discussed with Dr.  Lafonda Mosses

## 2022-07-06 NOTE — Patient Instructions (Signed)
Mr.Elai L Pettry, it was a pleasure seeing you today!  Today we discussed: Blood pressure - Will increase dose of hydralazine (continue taking 3 times daily).  Will revisit topic of colonoscopy at next visit.  I have ordered the following labs today:  Lab Orders  No laboratory test(s) ordered today     Tests ordered today:  none  Referrals ordered today:   Referral Orders  No referral(s) requested today     I have ordered the following medication/changed the following medications:   Stop the following medications: Medications Discontinued During This Encounter  Medication Reason   furosemide (LASIX) 40 MG tablet Reorder     Start the following medications: Meds ordered this encounter  Medications   furosemide (LASIX) 40 MG tablet    Sig: Take 1 tablet (40 mg total) by mouth 2 (two) times daily.    Dispense:  30 tablet    Refill:  0     Follow-up: 6 months   Please make sure to arrive 15 minutes prior to your next appointment. If you arrive late, you may be asked to reschedule.   We look forward to seeing you next time. Please call our clinic at [phone] if you have any questions or concerns. The best time to call is Monday-Friday from 9am-4pm, but there is someone available 24/7. If after hours or the weekend, call the main hospital number and ask for the Internal Medicine Resident On-Call. If you need medication refills, please notify your pharmacy one week in advance and they will send [place] a request.  Thank you for letting us take part in your care. Wishing you the best!  Thank you, Adron Bene, MD

## 2022-07-06 NOTE — Assessment & Plan Note (Signed)
Patient is due for colon cancer screening. He has not had one. Discussed benefits. He would like to think about it and revisit at next appointment. -discuss colonoscopy again at follow up. If still not interested, could consider home FOBT.

## 2022-07-07 NOTE — Progress Notes (Signed)
Internal Medicine Clinic Attending  Case discussed with Dr. White  At the time of the visit.  We reviewed the resident's history and exam and pertinent patient test results.  I agree with the assessment, diagnosis, and plan of care documented in the resident's note.  

## 2022-07-22 ENCOUNTER — Other Ambulatory Visit: Payer: Self-pay

## 2022-07-22 ENCOUNTER — Other Ambulatory Visit (HOSPITAL_COMMUNITY): Payer: Self-pay

## 2022-08-04 ENCOUNTER — Other Ambulatory Visit: Payer: Self-pay

## 2022-08-04 ENCOUNTER — Other Ambulatory Visit (HOSPITAL_COMMUNITY): Payer: Self-pay

## 2022-08-05 ENCOUNTER — Other Ambulatory Visit (HOSPITAL_COMMUNITY): Payer: Self-pay

## 2022-08-06 ENCOUNTER — Other Ambulatory Visit (HOSPITAL_COMMUNITY): Payer: Self-pay

## 2022-08-09 ENCOUNTER — Other Ambulatory Visit (HOSPITAL_COMMUNITY): Payer: Self-pay

## 2022-08-23 DIAGNOSIS — N184 Chronic kidney disease, stage 4 (severe): Secondary | ICD-10-CM | POA: Diagnosis not present

## 2022-08-23 DIAGNOSIS — E1122 Type 2 diabetes mellitus with diabetic chronic kidney disease: Secondary | ICD-10-CM | POA: Diagnosis not present

## 2022-08-23 DIAGNOSIS — E875 Hyperkalemia: Secondary | ICD-10-CM | POA: Diagnosis not present

## 2022-08-23 DIAGNOSIS — I129 Hypertensive chronic kidney disease with stage 1 through stage 4 chronic kidney disease, or unspecified chronic kidney disease: Secondary | ICD-10-CM | POA: Diagnosis not present

## 2022-08-23 DIAGNOSIS — I5032 Chronic diastolic (congestive) heart failure: Secondary | ICD-10-CM | POA: Diagnosis not present

## 2022-09-05 ENCOUNTER — Other Ambulatory Visit (HOSPITAL_COMMUNITY): Payer: Self-pay

## 2022-09-12 ENCOUNTER — Other Ambulatory Visit (HOSPITAL_COMMUNITY): Payer: Self-pay

## 2022-11-09 ENCOUNTER — Ambulatory Visit: Payer: 59 | Attending: Cardiology | Admitting: Cardiology

## 2022-11-09 ENCOUNTER — Encounter: Payer: Self-pay | Admitting: Cardiology

## 2022-11-09 VITALS — BP 160/94 | HR 72 | Ht 67.0 in | Wt 185.6 lb

## 2022-11-09 DIAGNOSIS — I5032 Chronic diastolic (congestive) heart failure: Secondary | ICD-10-CM

## 2022-11-09 DIAGNOSIS — I160 Hypertensive urgency: Secondary | ICD-10-CM | POA: Diagnosis not present

## 2022-11-09 DIAGNOSIS — Z79899 Other long term (current) drug therapy: Secondary | ICD-10-CM

## 2022-11-09 DIAGNOSIS — I2510 Atherosclerotic heart disease of native coronary artery without angina pectoris: Secondary | ICD-10-CM | POA: Diagnosis not present

## 2022-11-09 DIAGNOSIS — E782 Mixed hyperlipidemia: Secondary | ICD-10-CM | POA: Diagnosis not present

## 2022-11-09 DIAGNOSIS — R7989 Other specified abnormal findings of blood chemistry: Secondary | ICD-10-CM

## 2022-11-09 DIAGNOSIS — N184 Chronic kidney disease, stage 4 (severe): Secondary | ICD-10-CM

## 2022-11-09 MED ORDER — HYDRALAZINE HCL 100 MG PO TABS: 100.0000 mg | ORAL_TABLET | Freq: Three times a day (TID) | ORAL | 3 refills | Status: DC

## 2022-11-09 MED ORDER — ATORVASTATIN CALCIUM 40 MG PO TABS: 40.0000 mg | ORAL_TABLET | Freq: Every day | ORAL | 3 refills | Status: DC

## 2022-11-09 MED ORDER — HYDRALAZINE HCL 100 MG PO TABS: 100.0000 mg | ORAL_TABLET | Freq: Two times a day (BID) | ORAL | 3 refills | Status: DC

## 2022-11-09 NOTE — Progress Notes (Signed)
Cardiology Office Note:    Date:  11/09/2022   ID:  Samuel Gardner, DOB 1970-04-22, MRN [id number]  PCP:  Rocky Morel, DO  Cardiologist:  Armanda Magic, MD    Referring MD: Rocky Morel, DO   Chief Complaint  Patient presents with   Hypertension   Hyperlipidemia   Congestive Heart Failure    History of Present Illness:    Samuel Gardner is a 52 y.o. male with a hx significant for hypertension, diabetes mellitus, chronic HFpEF, CKD and COPD.    2D echo 01/31/2022 done for mildly elevated troponin in the setting of hypertensive urgency showed an LVEF 55 to 60% with grade 1 DD.  Ischemic work-up was not pursued inpatient since his chest pain was thought to be due to to demand ischemia in the setting of hypertension.  His Imdur was increased to 60 mg daily and amlodipine decreased to 5 mg daily due to bilateral pitting lower extremity edema.  He was referred to nephrology due to his AKI on CKD.  At last renal OV he was told that he will likely be moving towards HD soon and went over the types of dialysis.   He is here today for followup and is doing well.  He says that he has been having worsening SOB recently that he thinks may be related to the heat. He says that if he goes outside and then comes inside he feels SOB.  He also feels SOB when he lays down to go to sleep.  He also has noticed LE edema in the afternoon and if he takes the lasix it goes away.  He does salt his food some. He denies any chest pain or pressure,  dizziness, palpitations or syncope. He is compliant with his meds and is tolerating meds with no SE.    Past Medical History:  Diagnosis Date   (HFpEF) heart failure with preserved ejection fraction (HCC)    CKD (chronic kidney disease), stage IV (HCC)    Diabetes mellitus without complication (HCC)    Hypertension    Normocytic anemia     Past Surgical History:  Procedure Laterality Date   LEG SURGERY Right    teenager    Current  Medications: Current Meds  Medication Sig   albuterol (VENTOLIN HFA) 108 (90 Base) MCG/ACT inhaler Inhale 2 puffs into the lungs every 6 (six) hours as needed for wheezing or shortness of breath.   amLODipine (NORVASC) 5 MG tablet Take 1 tablet (5 mg total) by mouth daily.   carvedilol (COREG) 25 MG tablet Take 1 tablet (25 mg total) by mouth 2 (two) times daily.   hydrALAZINE (APRESOLINE) 50 MG tablet Take 1.5 tablets (75 mg total) by mouth 3 (three) times daily.   isosorbide mononitrate (IMDUR) 30 MG 24 hr tablet Take 3 tablets (90 mg total) by mouth daily.   KIONEX 15 GM/60ML suspension Take 15 g by mouth 3 (three) times a week.   nitroGLYCERIN (NITROSTAT) 0.4 MG SL tablet Place 1 tablet (0.4 mg total) under the tongue every 5 (five) minutes as needed for chest pain.     Allergies:   Patient has no known allergies.   Social History   Socioeconomic History   Marital status: Single    Spouse name: Not on file   Number of children: Not on file   Years of education: Not on file   Highest education level: Not on file  Occupational History   Not on file  Tobacco Use  Smoking status: Former    Current packs/day: 0.00    Types: Cigarettes    Quit date: 01/30/2022    Years since quitting: 0.7   Smokeless tobacco: Never  Substance and Sexual Activity   Alcohol use: Not Currently   Drug use: No   Sexual activity: Not on file  Other Topics Concern   Not on file  Social History Narrative   Not on file   Social Determinants of Health   Financial Resource Strain: Not on file  Food Insecurity: No Food Insecurity (02/23/2022)   Hunger Vital Sign    Worried About Running Out of Food in the Last Year: Never true    Ran Out of Food in the Last Year: Never true  Transportation Needs: No Transportation Needs (02/23/2022)   PRAPARE - Administrator, Civil Service (Medical): No    Lack of Transportation (Non-Medical): No  Physical Activity: Not on file  Stress: Not on file   Social Connections: Moderately Isolated (02/23/2022)   Social Connection and Isolation Panel [NHANES]    Frequency of Communication with Friends and Family: More than three times a week    Frequency of Social Gatherings with Friends and Family: More than three times a week    Attends Religious Services: Never    Database administrator or Organizations: No    Attends Engineer, structural: Never    Marital Status: Married     Family History: The patient's family history includes Diabetes in his mother and sister; Hypertension in his mother and sister; Kidney disease in his mother.  ROS:   Please see the history of present illness.    ROS  All other systems reviewed and negative.   EKGs/Labs/Other Studies Reviewed:    The following studies were reviewed today: 2D echo   Recent Labs: 02/07/2022: NT-Pro BNP 1,860 02/09/2022: ALT 30; Magnesium 2.3 06/24/2022: B Natriuretic Peptide 368.5; BUN 38; Creatinine, Ser 3.04; Hemoglobin 9.4; Platelets 248; Potassium 3.9; Sodium 135   Recent Lipid Panel    Component Value Date/Time   CHOL 208 (H) 01/30/2022 0351   TRIG 106 01/30/2022 0351   HDL 55 01/30/2022 0351   CHOLHDL 3.8 01/30/2022 0351   VLDL 21 01/30/2022 0351   LDLCALC 132 (H) 01/30/2022 0351     Physical Exam:    VS:  BP (!) 160/94 (BP Location: Left Arm, Patient Position: Sitting, Cuff Size: Large)   Pulse 72   Ht 5\' 7"  (1.702 m)   Wt 185 lb 9.6 oz (84.2 kg)   SpO2 98%   BMI 29.07 kg/m     Wt Readings from Last 3 Encounters:  11/09/22 185 lb 9.6 oz (84.2 kg)  07/06/22 184 lb 11.2 oz (83.8 kg)  06/24/22 188 lb (85.3 kg)    GEN: Well nourished, well developed in no acute distress HEENT: Normal NECK: No JVD; No carotid bruits LYMPHATICS: No lymphadenopathy CARDIAC:RRR, no murmurs, rubs, gallops RESPIRATORY:  Clear to auscultation without rales, wheezing or rhonchi  ABDOMEN: Soft, non-tender, non-distended MUSCULOSKELETAL:  trace edema; No deformity   SKIN: Warm and dry NEUROLOGIC:  Alert and oriented x 3 PSYCHIATRIC:  Normal affect   ASSESSMENT:    1. Chronic diastolic CHF (congestive heart failure) (HCC)   2. Elevated troponin   3. Coronary artery calcification seen on CAT scan   4. Hypertensive urgency   5. Mixed hyperlipidemia   6. CKD (chronic kidney disease), stage IV (HCC)     PLAN:  In order of problems listed above:   Chronic diastolic CHF -EF 85 to 60% by echo 01/2022  -Nephrology manages his diuretics given his CKD -Cannot use SGLT2i due to CKD -He is having worsening SOB both during the day and at night with what sounds like PND and has 2 pillow orthopnea as well as LE edema -Continue prescription drug management with Lasix 40 mg twice daily per nephrology recommendations -suspect that his renal function is continuing to deteriorate and may be nearing need for HD>>BP is also elevated as well -check BMET and BNP today -check 2D echo to reassess LVF   Elevated troponin/chest pain/coronary artery calcifications -Thought to be due to demand ischemia from poorly controlled hypertension -EF 55 to 60% on 01/30/2022 -Stress PET CT done 05/17/2022 demonstrated no ischemia but reduced global blood flow reserve concerning for possible severe multivessel CAD causing balanced ischemia versus microvascular disease.  There was no evidence of TID or drop of EF with stress so suspected that this was likely microvascular disease.  He only had mild coronary artery calcifications on CT at that time.  Medical management was recommended. -He denies any anginal symptoms but is having worsening SOB suspect from volume overload from worsening renal function and poorly controlled BP -Continue prescription drug management with Imdur 90 mg daily  -He is currently not on statin therapy so I will start atorvastatin 40 mg daily -he was told not to take any ASA due to bleeding tendencies with worsening renal function and platelet  dysfunction  Hypertensive urgency -BP remains elevated today -He has not tolerated doses of amlodipine higher than 5 mg daily due to lower extremity edema -No ACE/ARB/ARNI/MRA due to CKD -increase Hydralazine to 100mg  TID -Continue prescription drug management with amlodipine 5 mg daily,  Imdur 90 mg daily, carvedilol 25 mg twice daily with as needed refills   Hyperlipidemia -LDL goal less than 70 due to coronary calcifications -Start atorvastatin 40 mg daily due to LDL of 132 on 01/30/2022 -Repeat FLP and ALT in 6 weeks  CKD stage 4 -Diuretics managed by nephrology -suspect this is continuing to decline due to recent fluid retention  Followup with PA in 6 weeks and me in 6 months    Time Spent: 20 minutes total time of encounter, including 15 minutes spent in face-to-face patient care on the date of this encounter. This time includes coordination of care and counseling regarding above mentioned problem list. Remainder of non-face-to-face time involved reviewing chart documents/testing relevant to the patient encounter and documentation in the medical record. I have independently reviewed documentation from referring provider  Medication Adjustments/Labs and Tests Ordered: Current medicines are reviewed at length with the patient today.  Concerns regarding medicines are outlined above.  No orders of the defined types were placed in this encounter.  No orders of the defined types were placed in this encounter.   Signed, Armanda Magic, MD  11/09/2022 1:12 PM    [facility]

## 2022-11-09 NOTE — Patient Instructions (Signed)
Medication Instructions:  Please START atorvastatin 40 mg daily. Most patients take this medication at night before bed.   Please INCREASE your dose of hydralazine to 100 mg three times daily.  *If you need a refill on your cardiac medications before your next appointment, please call your pharmacy*   Lab Work: Please complete a BNP and a BMET in our lab before you leave today.  Please make an appointment to complete a FASTING lipid panel and an ALT in our lab in 6 weeks.  If you have labs (blood work) drawn today and your tests are completely normal, you will receive your results only by: MyChart Message (if you have MyChart) OR A paper copy in the mail If you have any lab test that is abnormal or we need to change your treatment, we will call you to review the results.   Testing/Procedures: Your physician has requested that you have an echocardiogram. Echocardiography is a painless test that uses sound waves to create images of your heart. It provides your doctor with information about the size and shape of your heart and how well your heart's chambers and valves are working. This procedure takes approximately one hour. There are no restrictions for this procedure. Please do NOT wear cologne, perfume, aftershave, or lotions (deodorant is allowed). Please arrive 15 minutes prior to your appointment time.    Follow-Up: At [facility], you and your health needs are our priority.  As part of our continuing mission to provide you with exceptional heart care, we have created designated Provider Care Teams.  These Care Teams include your primary Cardiologist (physician) and Advanced Practice Providers (APPs -  Physician Assistants and Nurse Practitioners) who all work together to provide you with the care you need, when you need it.  We recommend signing up for the patient portal called "MyChart".  Sign up information is provided on this After Visit Summary.  MyChart is used to  connect with patients for Virtual Visits (Telemedicine).  Patients are able to view lab/test results, encounter notes, upcoming appointments, etc.  Non-urgent messages can be sent to your provider as well.   To learn more about what you can do with MyChart, go to [email or web address].    Your next appointment:   6 week(s)  Provider:   Jari Favre, [name], Robin Searing, NP, Eligha Bridegroom, NP, or Tereso Newcomer, [name]     Then, Armanda Magic, MD will plan to see you again in 6 month(s).

## 2022-11-09 NOTE — Addendum Note (Signed)
Addended by: Luellen Pucker on: 11/09/2022 01:31 PM   Modules accepted: Orders

## 2022-11-10 ENCOUNTER — Encounter (HOSPITAL_COMMUNITY): Payer: Self-pay | Admitting: Emergency Medicine

## 2022-11-10 ENCOUNTER — Telehealth: Payer: Self-pay

## 2022-11-10 ENCOUNTER — Inpatient Hospital Stay (HOSPITAL_COMMUNITY)
Admission: EM | Admit: 2022-11-10 | Discharge: 2022-11-12 | DRG: 291 | Disposition: A | Discharge status: Home / Self Care | Payer: 59 | Attending: Internal Medicine | Admitting: Internal Medicine

## 2022-11-10 ENCOUNTER — Other Ambulatory Visit: Payer: Self-pay

## 2022-11-10 ENCOUNTER — Emergency Department (HOSPITAL_COMMUNITY): Payer: 59

## 2022-11-10 DIAGNOSIS — I5033 Acute on chronic diastolic (congestive) heart failure: Secondary | ICD-10-CM | POA: Diagnosis present

## 2022-11-10 DIAGNOSIS — Z79899 Other long term (current) drug therapy: Secondary | ICD-10-CM

## 2022-11-10 DIAGNOSIS — D649 Anemia, unspecified: Secondary | ICD-10-CM | POA: Diagnosis present

## 2022-11-10 DIAGNOSIS — R7303 Prediabetes: Secondary | ICD-10-CM | POA: Diagnosis present

## 2022-11-10 DIAGNOSIS — R0600 Dyspnea, unspecified: Secondary | ICD-10-CM | POA: Diagnosis not present

## 2022-11-10 DIAGNOSIS — N179 Acute kidney failure, unspecified: Secondary | ICD-10-CM | POA: Diagnosis not present

## 2022-11-10 DIAGNOSIS — N186 End stage renal disease: Secondary | ICD-10-CM | POA: Diagnosis present

## 2022-11-10 DIAGNOSIS — R001 Bradycardia, unspecified: Secondary | ICD-10-CM | POA: Diagnosis present

## 2022-11-10 DIAGNOSIS — Z8249 Family history of ischemic heart disease and other diseases of the circulatory system: Secondary | ICD-10-CM

## 2022-11-10 DIAGNOSIS — N185 Chronic kidney disease, stage 5: Secondary | ICD-10-CM | POA: Diagnosis present

## 2022-11-10 DIAGNOSIS — R0989 Other specified symptoms and signs involving the circulatory and respiratory systems: Secondary | ICD-10-CM | POA: Diagnosis not present

## 2022-11-10 DIAGNOSIS — D631 Anemia in chronic kidney disease: Secondary | ICD-10-CM | POA: Diagnosis present

## 2022-11-10 DIAGNOSIS — Z833 Family history of diabetes mellitus: Secondary | ICD-10-CM

## 2022-11-10 DIAGNOSIS — Z87891 Personal history of nicotine dependence: Secondary | ICD-10-CM

## 2022-11-10 DIAGNOSIS — N189 Chronic kidney disease, unspecified: Secondary | ICD-10-CM | POA: Diagnosis present

## 2022-11-10 DIAGNOSIS — I129 Hypertensive chronic kidney disease with stage 1 through stage 4 chronic kidney disease, or unspecified chronic kidney disease: Secondary | ICD-10-CM | POA: Diagnosis not present

## 2022-11-10 DIAGNOSIS — I517 Cardiomegaly: Secondary | ICD-10-CM | POA: Diagnosis not present

## 2022-11-10 DIAGNOSIS — R0602 Shortness of breath: Secondary | ICD-10-CM | POA: Diagnosis not present

## 2022-11-10 DIAGNOSIS — K5900 Constipation, unspecified: Secondary | ICD-10-CM | POA: Diagnosis present

## 2022-11-10 DIAGNOSIS — I130 Hypertensive heart and chronic kidney disease with heart failure and stage 1 through stage 4 chronic kidney disease, or unspecified chronic kidney disease: Principal | ICD-10-CM | POA: Diagnosis present

## 2022-11-10 DIAGNOSIS — Z841 Family history of disorders of kidney and ureter: Secondary | ICD-10-CM

## 2022-11-10 DIAGNOSIS — I10 Essential (primary) hypertension: Secondary | ICD-10-CM | POA: Diagnosis present

## 2022-11-10 DIAGNOSIS — N184 Chronic kidney disease, stage 4 (severe): Secondary | ICD-10-CM | POA: Diagnosis present

## 2022-11-10 LAB — CBC WITH DIFFERENTIAL/PLATELET
Abs Immature Granulocytes: 0.02 10*3/uL (ref 0.00–0.07)
Basophils Absolute: 0.1 10*3/uL (ref 0.0–0.1)
Basophils Relative: 1 %
Eosinophils Absolute: 0.2 10*3/uL (ref 0.0–0.5)
Eosinophils Relative: 4 %
HCT: 27.8 % — ABNORMAL LOW (ref 39.0–52.0)
Hemoglobin: 8.9 g/dL — ABNORMAL LOW (ref 13.0–17.0)
Immature Granulocytes: 0 %
Lymphocytes Relative: 19 %
Lymphs Abs: 1.2 10*3/uL (ref 0.7–4.0)
MCH: 28 pg (ref 26.0–34.0)
MCHC: 32 g/dL (ref 30.0–36.0)
MCV: 87.4 fL (ref 80.0–100.0)
Monocytes Absolute: 0.9 10*3/uL (ref 0.1–1.0)
Monocytes Relative: 15 %
Neutro Abs: 3.7 10*3/uL (ref 1.7–7.7)
Neutrophils Relative %: 61 %
Platelets: 248 10*3/uL (ref 150–400)
RBC: 3.18 MIL/uL — ABNORMAL LOW (ref 4.22–5.81)
RDW: 14.4 % (ref 11.5–15.5)
WBC: 6.2 10*3/uL (ref 4.0–10.5)
nRBC: 0 % (ref 0.0–0.2)

## 2022-11-10 LAB — URINALYSIS, ROUTINE W REFLEX MICROSCOPIC
Bilirubin Urine: NEGATIVE
Glucose, UA: 150 mg/dL — AB
Ketones, ur: NEGATIVE mg/dL
Leukocytes,Ua: NEGATIVE
Nitrite: NEGATIVE
Protein, ur: >=300 mg/dL — AB
Specific Gravity, Urine: 1.015 (ref 1.005–1.030)
pH: 5 (ref 5.0–8.0)

## 2022-11-10 LAB — BASIC METABOLIC PANEL
BUN/Creatinine Ratio: 10 (ref 9–20)
BUN: 40 mg/dL — ABNORMAL HIGH (ref 6–24)
CO2: 21 mmol/L (ref 20–29)
Calcium: 7.6 mg/dL — ABNORMAL LOW (ref 8.7–10.2)
Chloride: 105 mmol/L (ref 96–106)
Creatinine, Ser: 4.16 mg/dL — ABNORMAL HIGH (ref 0.76–1.27)
Glucose: 94 mg/dL (ref 70–99)
Potassium: 3.8 mmol/L (ref 3.5–5.2)
Sodium: 141 mmol/L (ref 134–144)
eGFR: 16 mL/min/{1.73_m2} — ABNORMAL LOW (ref >59)

## 2022-11-10 LAB — COMPREHENSIVE METABOLIC PANEL
ALT: 16 U/L (ref 0–44)
AST: 17 U/L (ref 15–41)
Albumin: 2.3 g/dL — ABNORMAL LOW (ref 3.5–5.0)
Alkaline Phosphatase: 74 U/L (ref 38–126)
Anion gap: 11 (ref 5–15)
BUN: 38 mg/dL — ABNORMAL HIGH (ref 6–20)
CO2: 20 mmol/L — ABNORMAL LOW (ref 22–32)
Calcium: 7.3 mg/dL — ABNORMAL LOW (ref 8.9–10.3)
Chloride: 108 mmol/L (ref 98–111)
Creatinine, Ser: 4.47 mg/dL — ABNORMAL HIGH (ref 0.61–1.24)
GFR, Estimated: 15 mL/min — ABNORMAL LOW (ref >60)
Glucose, Bld: 145 mg/dL — ABNORMAL HIGH (ref 70–99)
Potassium: 3.5 mmol/L (ref 3.5–5.1)
Sodium: 139 mmol/L (ref 135–145)
Total Bilirubin: 0.5 mg/dL (ref 0.3–1.2)
Total Protein: 5.3 g/dL — ABNORMAL LOW (ref 6.5–8.1)

## 2022-11-10 LAB — BRAIN NATRIURETIC PEPTIDE: B Natriuretic Peptide: 574.3 pg/mL — ABNORMAL HIGH (ref 0.0–100.0)

## 2022-11-10 LAB — PRO B NATRIURETIC PEPTIDE: NT-Pro BNP: 5948 pg/mL — ABNORMAL HIGH (ref 0–121)

## 2022-11-10 NOTE — ED Provider Triage Note (Addendum)
Emergency Medicine Provider Triage Evaluation Note  Samuel Gardner , a 52 y.o. male  was evaluated in triage.  Pt complains of abnormal labs/elevated creatinine..  Was seen at cardiologist office yesterday and was told to come in due to his elevated kidney levels.  Per cardiology note, mentions admission.  Patient denies any change to his urinary frequency or amount.  Reports he is not having some orthopnea over the past week or 2.  Some chronic lower edema to his lower legs.  No fever.  No chest pain or shortness of breath currently.  Review of Systems  Positive:  Negative:   Physical Exam  There were no vitals taken for this visit. Gen:   Awake, no distress   Resp:  Normal effort  MSK:   Moves extremities without difficulty  Other:  2+ pitting edema to bilateral lower extremities.  Medical Decision Making  Medically screening exam initiated at 5:38 PM.  Appropriate orders placed.  Samuel Gardner was informed that the remainder of the evaluation will be completed by another provider, this initial triage assessment does not replace that evaluation, and the importance of remaining in the ED until their evaluation is complete.  Patient well-appearing in no acute distress.  I have already messaged nephrologist to let him know that he is here.  Will order basic labs.   Achille Rich, [name] 11/10/22 1743    Achille Rich, [name] 11/10/22 1744

## 2022-11-10 NOTE — Telephone Encounter (Signed)
-----   Message from Armanda Magic sent at 11/10/2022 10:17 AM EDT ----- Alcario Drought please call patient's nephrologist and let them know that patient is kidney function is much worse and was seen yesterday and has volume overload and they need to get him in to be seen today

## 2022-11-10 NOTE — Telephone Encounter (Signed)
Call to [facility], supervisor at [employer]. Reported elevated Cr and faxed over physical copy. Patient w/ visit scheduled 11/27/22, asked if patient could be seen sooner. [name] states patient's normal nephrologist is in the hospital this week, asked if one of the other MD's at the practice could see patient. [name] states she will check and either call me or call Mr. Kingcade to schedule them.  Call to Rinaldo Cloud (spouse) and patient Samuel Gardner to let them know of elevated Cr and Dr. Norris Cross request that they get in with nephrologist as soon as possible. Patient and spouse verbalize understanding.

## 2022-11-10 NOTE — Telephone Encounter (Signed)
Dr. Mayford Knife requesting that patient be notified that his kidneys are much worse and he needs to go to [facility] for admission today. Spoke to patient and spouse Rinaldo Cloud [facility]) and advised that Dr. Mayford Knife spoke with Dr. Malen Gauze and Dr. Thedore Mins from [facility] and they are concerned he needs to get extra fluid off his body as soon as possible. Advised that Dr. Thedore Mins is on tonight and will be looking for them. Patient and spouse verbalize understanding.

## 2022-11-10 NOTE — ED Triage Notes (Signed)
Pt sent by physician for worsening kidney function and admission. Pt has had increasing swelling and shob when lying flat recently.  These symptoms are helped by elevating and taking lasix.  Pt has not noticed any decrease in his urinary output.

## 2022-11-10 NOTE — Progress Notes (Signed)
Informed of patient in ER. Patient still in waiting room.  Presenting with volume overload, AKI. B/L Cr seems to be around 3-4, followed by Dr. Malen Gauze. Instructed to come to ER by cardiology.  Labs, CXR reviewed. Cr up to 4.47. Would recommend diuresis for now. Questionable if he has CRS.  Full consult to follow. Call with any questions/concerns in the meantime.  Anthony Sar, MD [facility]

## 2022-11-11 ENCOUNTER — Encounter (HOSPITAL_COMMUNITY): Payer: Self-pay | Admitting: Internal Medicine

## 2022-11-11 ENCOUNTER — Observation Stay (HOSPITAL_COMMUNITY): Payer: 59

## 2022-11-11 DIAGNOSIS — R0602 Shortness of breath: Secondary | ICD-10-CM | POA: Diagnosis not present

## 2022-11-11 DIAGNOSIS — I5033 Acute on chronic diastolic (congestive) heart failure: Secondary | ICD-10-CM | POA: Diagnosis not present

## 2022-11-11 DIAGNOSIS — D631 Anemia in chronic kidney disease: Secondary | ICD-10-CM | POA: Diagnosis not present

## 2022-11-11 DIAGNOSIS — Z8249 Family history of ischemic heart disease and other diseases of the circulatory system: Secondary | ICD-10-CM | POA: Diagnosis not present

## 2022-11-11 DIAGNOSIS — R0989 Other specified symptoms and signs involving the circulatory and respiratory systems: Secondary | ICD-10-CM | POA: Diagnosis not present

## 2022-11-11 DIAGNOSIS — R0600 Dyspnea, unspecified: Secondary | ICD-10-CM | POA: Insufficient documentation

## 2022-11-11 DIAGNOSIS — Z87891 Personal history of nicotine dependence: Secondary | ICD-10-CM | POA: Diagnosis not present

## 2022-11-11 DIAGNOSIS — N179 Acute kidney failure, unspecified: Secondary | ICD-10-CM | POA: Diagnosis not present

## 2022-11-11 DIAGNOSIS — R7303 Prediabetes: Secondary | ICD-10-CM | POA: Diagnosis not present

## 2022-11-11 DIAGNOSIS — N184 Chronic kidney disease, stage 4 (severe): Secondary | ICD-10-CM | POA: Diagnosis not present

## 2022-11-11 DIAGNOSIS — Z841 Family history of disorders of kidney and ureter: Secondary | ICD-10-CM | POA: Diagnosis not present

## 2022-11-11 DIAGNOSIS — R0609 Other forms of dyspnea: Secondary | ICD-10-CM

## 2022-11-11 DIAGNOSIS — I130 Hypertensive heart and chronic kidney disease with heart failure and stage 1 through stage 4 chronic kidney disease, or unspecified chronic kidney disease: Secondary | ICD-10-CM | POA: Diagnosis not present

## 2022-11-11 DIAGNOSIS — N185 Chronic kidney disease, stage 5: Secondary | ICD-10-CM | POA: Diagnosis not present

## 2022-11-11 DIAGNOSIS — I120 Hypertensive chronic kidney disease with stage 5 chronic kidney disease or end stage renal disease: Secondary | ICD-10-CM | POA: Diagnosis not present

## 2022-11-11 DIAGNOSIS — E8779 Other fluid overload: Secondary | ICD-10-CM | POA: Diagnosis not present

## 2022-11-11 DIAGNOSIS — Z833 Family history of diabetes mellitus: Secondary | ICD-10-CM | POA: Diagnosis not present

## 2022-11-11 DIAGNOSIS — Z79899 Other long term (current) drug therapy: Secondary | ICD-10-CM | POA: Diagnosis not present

## 2022-11-11 DIAGNOSIS — D649 Anemia, unspecified: Secondary | ICD-10-CM | POA: Diagnosis not present

## 2022-11-11 DIAGNOSIS — K5900 Constipation, unspecified: Secondary | ICD-10-CM | POA: Diagnosis not present

## 2022-11-11 DIAGNOSIS — R001 Bradycardia, unspecified: Secondary | ICD-10-CM | POA: Diagnosis not present

## 2022-11-11 LAB — RENAL FUNCTION PANEL
Albumin: 2.3 g/dL — ABNORMAL LOW (ref 3.5–5.0)
Anion gap: 9 (ref 5–15)
BUN: 38 mg/dL — ABNORMAL HIGH (ref 6–20)
CO2: 22 mmol/L (ref 22–32)
Calcium: 7.8 mg/dL — ABNORMAL LOW (ref 8.9–10.3)
Chloride: 110 mmol/L (ref 98–111)
Creatinine, Ser: 4.28 mg/dL — ABNORMAL HIGH (ref 0.61–1.24)
GFR, Estimated: 16 mL/min — ABNORMAL LOW (ref >60)
Glucose, Bld: 101 mg/dL — ABNORMAL HIGH (ref 70–99)
Phosphorus: 4.5 mg/dL (ref 2.5–4.6)
Potassium: 3.6 mmol/L (ref 3.5–5.1)
Sodium: 141 mmol/L (ref 135–145)

## 2022-11-11 LAB — OSMOLALITY, URINE: Osmolality, Ur: 287 mOsm/kg — ABNORMAL LOW (ref 300–900)

## 2022-11-11 LAB — ECHOCARDIOGRAM COMPLETE
AR max vel: 2.91 cm2
AV Peak grad: 11 mmHg
Ao pk vel: 1.66 m/s
Area-P 1/2: 3.77 cm2
MV M vel: 4.45 m/s
MV Peak grad: 79.2 mmHg
S' Lateral: 3.8 cm
Weight: 2895.96 oz

## 2022-11-11 LAB — MAGNESIUM: Magnesium: 1.7 mg/dL (ref 1.7–2.4)

## 2022-11-11 LAB — HEMOGLOBIN A1C
Hgb A1c MFr Bld: 6.1 % — ABNORMAL HIGH (ref 4.8–5.6)
Mean Plasma Glucose: 128.37 mg/dL

## 2022-11-11 LAB — NA AND K (SODIUM & POTASSIUM), RAND UR
Potassium Urine: 6 mmol/L
Sodium, Ur: 122 mmol/L

## 2022-11-11 LAB — CREATININE, URINE, RANDOM: Creatinine, Urine: 18 mg/dL

## 2022-11-11 LAB — GLUCOSE, CAPILLARY
Glucose-Capillary: 89 mg/dL (ref 70–99)
Glucose-Capillary: 114 mg/dL — ABNORMAL HIGH (ref 70–99)
Glucose-Capillary: 94 mg/dL (ref 70–99)

## 2022-11-11 MED ORDER — AMLODIPINE BESYLATE 5 MG PO TABS
5.0000 mg | ORAL_TABLET | Freq: Every day | ORAL | Status: DC
  Administered 2022-11-11 – 2022-11-12 (×2): 5 mg via ORAL
  Filled 2022-11-11 (×2): qty 1

## 2022-11-11 MED ORDER — ISOSORBIDE MONONITRATE ER 60 MG PO TB24: 90.0000 mg | ORAL_TABLET | Freq: Every day | ORAL | Status: DC

## 2022-11-11 MED ORDER — ENOXAPARIN SODIUM 30 MG/0.3ML IJ SOSY
30.0000 mg | PREFILLED_SYRINGE | INTRAMUSCULAR | Status: DC
  Administered 2022-11-11 – 2022-11-12 (×2): 30 mg via SUBCUTANEOUS
  Filled 2022-11-11 (×2): qty 0.3

## 2022-11-11 MED ORDER — ALBUTEROL SULFATE (2.5 MG/3ML) 0.083% IN NEBU: 2.5000 mg | INHALATION_SOLUTION | Freq: Four times a day (QID) | RESPIRATORY_TRACT | Status: DC | PRN

## 2022-11-11 MED ORDER — FUROSEMIDE 10 MG/ML IJ SOLN
80.0000 mg | Freq: Two times a day (BID) | INTRAMUSCULAR | Status: DC
  Administered 2022-11-11 – 2022-11-12 (×3): 80 mg via INTRAVENOUS
  Filled 2022-11-11 (×3): qty 8

## 2022-11-11 MED ORDER — ATORVASTATIN CALCIUM 40 MG PO TABS
40.0000 mg | ORAL_TABLET | Freq: Every day | ORAL | Status: DC
  Administered 2022-11-11 – 2022-11-12 (×2): 40 mg via ORAL
  Filled 2022-11-11 (×2): qty 1

## 2022-11-11 MED ORDER — ISOSORBIDE MONONITRATE ER 60 MG PO TB24
90.0000 mg | ORAL_TABLET | Freq: Every day | ORAL | Status: DC
  Administered 2022-11-11 – 2022-11-12 (×2): 90 mg via ORAL
  Filled 2022-11-11 (×2): qty 1

## 2022-11-11 MED ORDER — AMLODIPINE BESYLATE 5 MG PO TABS: 5.0000 mg | ORAL_TABLET | Freq: Every day | ORAL | Status: DC

## 2022-11-11 MED ORDER — HEPARIN SODIUM (PORCINE) 5000 UNIT/ML IJ SOLN: 5000.0000 [IU] | Freq: Three times a day (TID) | INTRAMUSCULAR | Status: DC

## 2022-11-11 MED ORDER — DARBEPOETIN ALFA 100 MCG/0.5ML IJ SOSY
100.0000 ug | PREFILLED_SYRINGE | INTRAMUSCULAR | Status: DC
  Administered 2022-11-11: 100 ug via SUBCUTANEOUS
  Filled 2022-11-11: qty 0.5

## 2022-11-11 MED ORDER — HYDRALAZINE HCL 50 MG PO TABS
100.0000 mg | ORAL_TABLET | Freq: Three times a day (TID) | ORAL | Status: DC
  Administered 2022-11-11 – 2022-11-12 (×5): 100 mg via ORAL
  Filled 2022-11-11 (×5): qty 2

## 2022-11-11 MED ORDER — SENNA 8.6 MG PO TABS
1.0000 | ORAL_TABLET | Freq: Two times a day (BID) | ORAL | Status: DC
  Administered 2022-11-11 – 2022-11-12 (×3): 8.6 mg via ORAL
  Filled 2022-11-11 (×3): qty 1

## 2022-11-11 MED ORDER — FUROSEMIDE 10 MG/ML IJ SOLN
80.0000 mg | Freq: Once | INTRAMUSCULAR | Status: AC
  Administered 2022-11-11: 80 mg via INTRAVENOUS
  Filled 2022-11-11: qty 8

## 2022-11-11 MED ORDER — HYDRALAZINE HCL 50 MG PO TABS: 100.0000 mg | ORAL_TABLET | Freq: Three times a day (TID) | ORAL | Status: DC

## 2022-11-11 MED ORDER — SODIUM CHLORIDE 0.9% FLUSH
3.0000 mL | Freq: Two times a day (BID) | INTRAVENOUS | Status: DC
  Administered 2022-11-11 – 2022-11-12 (×3): 3 mL via INTRAVENOUS

## 2022-11-11 MED ORDER — ACETAMINOPHEN 325 MG PO TABS
650.0000 mg | ORAL_TABLET | Freq: Four times a day (QID) | ORAL | Status: DC | PRN
  Administered 2022-11-11: 650 mg via ORAL
  Filled 2022-11-11: qty 2

## 2022-11-11 MED ORDER — ACETAMINOPHEN 650 MG RE SUPP: 650.0000 mg | Freq: Four times a day (QID) | RECTAL | Status: DC | PRN

## 2022-11-11 NOTE — Progress Notes (Signed)
Patient arrived to room 6N03 from ED.  Assessment complete, VS obtained, and Admission database began.

## 2022-11-11 NOTE — Hospital Course (Addendum)
#   Acute kidney injury superimposed on chronic kidney disease #HFpEF Exacerbation LVEF 55-60% Patient presented to the ED on 8/29 due to the recommendation of his cardiologist because of an increased creatinine to 4.16 from baseline 3.97. He reported orthopnea, PND, bilateral LE edema and shortness of breath. His BP was 222/104; other vitals within normal limits. At the ED his creatinine was 4.47, GFR 15, BUN 38, BNP 574.3. CXR showed mild pulmonary congestion. Renal ultrasound demonstrated no acute findings with normal appearing kidneys. Echo showed LVEF 50-55% and Grade I LV diastolic dysfunction consistent with his previous echo in 01/2022.  AKI likely due to cardiorenal syndrome from fluid overload and congestion resulting in a prerenal AKI. Lasix was given which improved his symptoms. Nephrology was consulted and recommended to discharge with increased dose of Lasix to 80mg  BID.    #HTN  Patient has a history of hypertension on a home regimen of amlodipine, Lasix, Imdur, Hydralazine, and Coreg. Blood pressure on admission was 222/104 and remained elevated during his stay.  Hypertension likely due to fluid overload due to gradual progression of symptom onset. Imdur and hydralazine were continued during the stay, while amlodipine and carvedilol were held due to fluid overload. All medications restarted on discharge.  #Prediabetes Previous A1c 5.9%; current A1c 6.1%. Continued control with lifestyle modifications and CBG prior to meals.   #Normocytic anemia #Constipation Patient has anemia in the setting of CKD. Patient takes daily oral iron. Nephrology started Darbepoeitin Alfa. He reported constipation, likely due to oral iron. Oral iron held for one day and Senna given which resolved the constipation.   #Disability follow-up Social work will coordinate with financial counselor to call Samuel Gardner at home to help with his disability paperwork.

## 2022-11-11 NOTE — ED Provider Notes (Signed)
[place] EMERGENCY DEPARTMENT AT [facility] Provider Note   CSN: [id number] Arrival date & time: 11/10/22  1659     History  Chief Complaint  Patient presents with   Abnormal labs    Samuel Gardner is a 52 y.o. male.  HPI     This is a 52 year old male with a history of chronic kidney disease who presents with abnormal lab work.  Patient was encouraged to seek evaluation from his cardiologist.  Has been seen and evaluated in cardiology clinic for intermittent edema of the lower extremities and volume overload.  He reports that he is on a stable dose of Lasix.  He takes 40 mg daily.  Currently he is asymptomatic.  No significant shortness of breath or lower extremity edema.  Had lab work done yesterday and was noted to have increased in his creatinine.  There was concern that he may need dialysis.  Based on discussions noted in the chart between cardiology and nephrology, plan for admission for nephrology evaluation.  Patient denies any chest pain.  Home Medications Prior to Admission medications   Medication Sig Start Date End Date Taking? Authorizing Provider  albuterol (VENTOLIN HFA) 108 (90 Base) MCG/ACT inhaler Inhale 2 puffs into the lungs every 6 (six) hours as needed for wheezing or shortness of breath. 02/02/22   Leatha Gilding, MD  amLODipine (NORVASC) 5 MG tablet Take 1 tablet (5 mg total) by mouth daily. 05/23/22   [name], [name], MD  atorvastatin (LIPITOR) 40 MG tablet Take 1 tablet (40 mg total) by mouth daily. 11/09/22 02/07/23  Quintella Reichert, MD  carvedilol (COREG) 25 MG tablet Take 1 tablet (25 mg total) by mouth 2 (two) times daily. 04/06/22   Quintella Reichert, MD  furosemide (LASIX) 40 MG tablet Take 1 tablet (40 mg total) by mouth 2 (two) times daily. 07/06/22 08/05/22  Mercie Eon, MD  hydrALAZINE (APRESOLINE) 100 MG tablet Take 1 tablet (100 mg total) by mouth 3 (three) times daily. 11/09/22   Quintella Reichert, MD  isosorbide mononitrate  (IMDUR) 30 MG 24 hr tablet Take 3 tablets (90 mg total) by mouth daily. 05/19/22   [name], Rondel Jumbo, MD  KIONEX 15 GM/60ML suspension Take 15 g by mouth 3 (three) times a week. 11/07/22   [provider]  levalbuterol Pauline Aus) 0.63 MG/3ML nebulizer solution Take 3 mLs (0.63 mg total) by nebulization every 6 (six) hours as needed for shortness of breath. Patient not taking: Reported on 11/09/2022 02/07/22   Sharlene Dory, [name]  nitroGLYCERIN (NITROSTAT) 0.4 MG SL tablet Place 1 tablet (0.4 mg total) under the tongue every 5 (five) minutes as needed for chest pain. 02/07/22   Sharlene Dory, [name]  sodium zirconium cyclosilicate (LOKELMA) 10 g PACK packet Take 10 g by mouth daily. Patient not taking: Reported on 11/09/2022 05/23/22   Belva Agee, MD      Allergies    Patient has no known allergies.    Review of Systems   Review of Systems  Constitutional:  Negative for fever.  Respiratory:  Positive for shortness of breath.   Cardiovascular:  Positive for leg swelling. Negative for chest pain.  Gastrointestinal:  Negative for abdominal pain, nausea and vomiting.  All other systems reviewed and are negative.   Physical Exam Updated Vital Signs BP (!) 210/107   Pulse 61   Temp 98.4 F (36.9 C) (Oral)   Resp 14   SpO2 100%  Physical Exam Vitals and nursing  note reviewed.  Constitutional:      Appearance: He is well-developed. He is not ill-appearing.  HENT:     Head: Normocephalic and atraumatic.  Eyes:     Pupils: Pupils are equal, round, and reactive to light.  Cardiovascular:     Rate and Rhythm: Normal rate and regular rhythm.     Heart sounds: Normal heart sounds. No murmur heard. Pulmonary:     Effort: Pulmonary effort is normal. No respiratory distress.     Breath sounds: Normal breath sounds. No wheezing.     Comments: No respiratory distress Abdominal:     Palpations: Abdomen is soft.     Tenderness: There is no abdominal tenderness. There is no  rebound.  Musculoskeletal:     Cervical back: Neck supple.     Comments: Trace bilateral lower extremity edema  Lymphadenopathy:     Cervical: No cervical adenopathy.  Skin:    General: Skin is warm and dry.  Neurological:     Mental Status: He is alert and oriented to person, place, and time.  Psychiatric:        Mood and Affect: Mood normal.     ED Results / Procedures / Treatments   Labs (all labs ordered are listed, but only abnormal results are displayed) Labs Reviewed  CBC WITH DIFFERENTIAL/PLATELET - Abnormal; Notable for the following components:      Result Value   RBC 3.18 (*)    Hemoglobin 8.9 (*)    HCT 27.8 (*)    All other components within normal limits  COMPREHENSIVE METABOLIC PANEL - Abnormal; Notable for the following components:   CO2 20 (*)    Glucose, Bld 145 (*)    BUN 38 (*)    Creatinine, Ser 4.47 (*)    Calcium 7.3 (*)    Total Protein 5.3 (*)    Albumin 2.3 (*)    GFR, Estimated 15 (*)    All other components within normal limits  BRAIN NATRIURETIC PEPTIDE - Abnormal; Notable for the following components:   B Natriuretic Peptide 574.3 (*)    All other components within normal limits  URINALYSIS, ROUTINE W REFLEX MICROSCOPIC - Abnormal; Notable for the following components:   APPearance HAZY (*)    Glucose, UA 150 (*)    Hgb urine dipstick SMALL (*)    Protein, ur >=300 (*)    Bacteria, UA RARE (*)    All other components within normal limits    EKG EKG Interpretation Date/Time:  Thursday November 10 2022 17:33:38 EDT Ventricular Rate:  68 PR Interval:  170 QRS Duration:  94 QT Interval:  430 QTC Calculation: 457 R Axis:   58  Text Interpretation: Normal sinus rhythm Nonspecific ST abnormality Abnormal ECG When compared with ECG of 24-Jun-2022 20:36, PREVIOUS ECG IS PRESENT Confirmed by Ross Marcus ([id number]) on 11/11/2022 12:41:14 AM  Radiology DG Chest 2 View  Result Date: 11/10/2022 CLINICAL DATA:  Shortness of breath. EXAM:  CHEST - 2 VIEW COMPARISON:  June 24, 2022 FINDINGS: The cardiac silhouette is mildly enlarged and unchanged in size. Mild prominence of the perihilar pulmonary vasculature is noted. There is no evidence of an acute infiltrate, pleural effusion or pneumothorax. The visualized skeletal structures are unremarkable. IMPRESSION: Mild cardiomegaly and mild pulmonary vascular congestion without evidence of acute or active cardiopulmonary disease. Electronically Signed   By: Aram Candela M.D.   On: 11/10/2022 18:24    Procedures Procedures    Medications Ordered in ED Medications  furosemide (  LASIX) injection 80 mg (has no administration in time range)    ED Course/ Medical Decision Making/ A&P Clinical Course as of 11/11/22 0103  Fri Nov 11, 2022  1324 Spoke with Dr. Thedore Mins, nephrology.  Plan for admission to the hospitalist.  Does recommend 80 mg of IV Lasix.  Based on cardiology notes, also echocardiogram recommended. [CH]    Clinical Course User Index [CH] Onofre Gains, Mayer Masker, MD                                 Medical Decision Making Risk Prescription drug management. Decision regarding hospitalization.   This patient presents to the ED for concern of shortness of breath, volume overload, AKI, this involves an extensive number of treatment options, and is a complaint that carries with it a high risk of complications and morbidity.  I considered the following differential and admission for this acute, potentially life threatening condition.  The differential diagnosis includes worsening renal function secondary to overdiuresis or dehydration, progression of chronic kidney disease, heart failure  MDM:    This is a 52 year old male who presents with abnormal lab work.  Increasing creatinine.  Recent baseline has been around 2.9-3.  Lab work from cardiology office indicates creatinine increased to 4.  Patient is currently asymptomatic but has had some recent shortness of breath and edema.   No increasing doses of Lasix taken at home.  Denies any chest pain.  Labs here confirm creatinine of 4.47.  Patient is not in any respiratory distress.  Chest x-ray does indicate some pulmonary edema but the patient is not hypoxic.  Discussed briefly with Dr. Thedore Mins.  Recommends 80 mg of IV Lasix and admission to the hospitalist.  They will evaluate patient.  No emergent indication for dialysis at this time.  (Labs, imaging, consults)  Labs: I Ordered, and personally interpreted labs.  The pertinent results include: CBC, CMP, BNP  Imaging Studies ordered: I ordered imaging studies including chest x-ray I independently visualized and interpreted imaging. I agree with the radiologist interpretation  Additional history obtained from wife at bedside.  External records from outside source obtained and reviewed including prior evaluations and cardiology note  Cardiac Monitoring: The patient was maintained on a cardiac monitor.  If on the cardiac monitor, I personally viewed and interpreted the cardiac monitored which showed an underlying rhythm of: Sinus rhythm  Reevaluation: After the interventions noted above, I reevaluated the patient and found that they have :stayed the same  Social Determinants of Health:  lives independently  Disposition: Admit  Co morbidities that complicate the patient evaluation  Past Medical History:  Diagnosis Date   (HFpEF) heart failure with preserved ejection fraction (HCC)    CKD (chronic kidney disease), stage IV (HCC)    Diabetes mellitus without complication (HCC)    Hypertension    Normocytic anemia      Medicines Meds ordered this encounter  Medications   furosemide (LASIX) injection 80 mg    I have reviewed the patients home medicines and have made adjustments as needed  Problem List / ED Course: Problem List Items Addressed This Visit   None Visit Diagnoses     Acute renal failure superimposed on chronic kidney disease, unspecified  acute renal failure type, unspecified CKD stage (HCC)    -  Primary                   Final Clinical Impression(s) /  ED Diagnoses Final diagnoses:  Acute renal failure superimposed on chronic kidney disease, unspecified acute renal failure type, unspecified CKD stage [facility])    Rx / DC Orders ED Discharge Orders     None         Shon Baton, MD 11/11/22 [phone]

## 2022-11-11 NOTE — Progress Notes (Signed)
Echocardiogram 2D Echocardiogram has been performed.  Lucendia Herrlich 11/11/2022, 5:53 PM

## 2022-11-11 NOTE — Progress Notes (Signed)
Subjective:   Summary: Samuel Gardner is a 52 y.o. year old male currently admitted on the IMTS HD#0 for acute kidney injury superimposed on chronic kidney disease.  Overnight Events: No acute overnight events.  Patient slept for a few hours, but is still tired. He did not experience shortness of breath while lying down or having to sit up to catch his breath. He has had to sleep with 3 pillows due to shortness of breath for several years. He reports his lower extremity edema is improving. He reports having filled up 3-4 of the bedside urinals. Patient had BM this morning.      Objective:  Vital signs in last 24 hours: Vitals:   11/11/22 0115 11/11/22 0215 11/11/22 0342 11/11/22 0753  BP:  (!) 222/104 (!) 194/84 (!) 180/89  Pulse: 78 74 68 (!) 58  Resp: 16 16  17   Temp:   98.7 F (37.1 C) 98.6 F (37 C)  TempSrc:   Oral Oral  SpO2: 100% 100% 97% 100%   Supplemental O2: Room Air SpO2: 100 %   Physical Exam:  Constitutional: well-appearing, lying in bed, in no acute distress Cardiovascular: RRR, no murmurs, rubs or gallops. Hepatojugular reflex present. +JVD.  Pulmonary/Chest: normal work of breathing on room air, lungs clear to auscultation bilaterally Abdominal: soft, non-distended Skin: warm and dry Extremities: lower extremity pulses 2+, mild lower extremity edema at the ankles present  There were no vitals filed for this visit.   Intake/Output Summary (Last 24 hours) at 11/11/2022 1025 Last data filed at 11/11/2022 0700 Gross per 24 hour  Intake 360 ml  Output 1700 ml  Net -1340 ml   Net IO Since Admission: -1,340 mL [11/11/22 1025]  Pertinent Labs:    Latest Ref Rng & Units 11/10/2022    5:57 PM 06/24/2022    5:19 PM 02/23/2022    2:58 PM  CBC  WBC 4.0 - 10.5 K/uL 6.2  7.2  6.7   Hemoglobin 13.0 - 17.0 g/dL 8.9  9.4  16.1   Hematocrit 39.0 - 52.0 % 27.8  29.7  33.8   Platelets 150 - 400 K/uL 248  248  309        Latest Ref Rng &  Units 11/11/2022    3:41 AM 11/10/2022    5:57 PM 11/09/2022    1:51 PM  CMP  Glucose 70 - 99 mg/dL 096  045  94   BUN 6 - 20 mg/dL 38  38  40   Creatinine 0.61 - 1.24 mg/dL 4.09  8.11  9.14   Sodium 135 - 145 mmol/L 141  139  141   Potassium 3.5 - 5.1 mmol/L 3.6  3.5  3.8   Chloride 98 - 111 mmol/L 110  108  105   CO2 22 - 32 mmol/L 22  20  21    Calcium 8.9 - 10.3 mg/dL 7.8  7.3  7.6   Total Protein 6.5 - 8.1 g/dL  5.3    Total Bilirubin 0.3 - 1.2 mg/dL  0.5    Alkaline Phos 38 - 126 U/L  74    AST 15 - 41 U/L  17    ALT 0 - 44 U/L  16      I personally reviewed interval labs and pertinent results include: CMP: Cr: 4.28, BUN 38, Ca 7.8. CBC: Hgb 8.9, Hct 27.8. Urine studies: Urine Na: 122, Urine  K 6, Urine osm 287, urine creatinine 18.                                             Imaging: US RENAL  Result Date: 11/11/2022 CLINICAL DATA:  Acute kidney injury. EXAM: RENAL / URINARY TRACT ULTRASOUND COMPLETE COMPARISON:  01/31/2022 FINDINGS: Right Kidney: Renal measurements: 12.2 x 5.2 x 5.9 cm = volume: 196 mL. Echogenicity within normal limits. No mass or hydronephrosis visualized. Small amount of perinephric fluid or edema. Left Kidney: Renal measurements: 12.0 x 7.3 x 6.2 cm = volume: 283 mL. Echogenicity within normal limits. No mass or hydronephrosis visualized. Bladder: Mild irregularity of the bladder wall is similar to the previous examination. Fluid in the urinary bladder. Other: None. IMPRESSION: 1. Normal appearance of both kidneys without hydronephrosis. 2. Small amount of right perinephric fluid. Electronically Signed   By: Richarda Overlie M.D.   On: 11/11/2022 09:19   DG Chest 2 View  Result Date: 11/10/2022 CLINICAL DATA:  Shortness of breath. EXAM: CHEST - 2 VIEW COMPARISON:  June 24, 2022 FINDINGS: The cardiac silhouette is mildly enlarged and unchanged in size. Mild prominence of the perihilar pulmonary vasculature is noted. There is no evidence of an acute infiltrate, pleural  effusion or pneumothorax. The visualized skeletal structures are unremarkable. IMPRESSION: Mild cardiomegaly and mild pulmonary vascular congestion without evidence of acute or active cardiopulmonary disease. Electronically Signed   By: Aram Candela M.D.   On: 11/10/2022 18:24   I personally reviewed interval imaging and my interpretation is as follows: No acute findings. Normal appearance of both kidneys without hydronephrosis. Small right perinephric fluid.   EKG: My EKG interpretation is as follows: No new EKG.  Assessment/Plan:   Principal Problem:   Acute kidney injury superimposed on chronic kidney disease (HCC) Active Problems:   CKD (chronic kidney disease), stage IV (HCC)   Essential hypertension   Normocytic anemia   Acute on chronic heart failure with preserved ejection fraction (HFpEF) (HCC)   Patient Summary: Samuel Gardner is a 52 y.o. with a pertinent PMH of CKD stage IV, HFpEF, HTN, normocytic anemia, prediabetes, who presented with volume overload and admitted for acute kidney injury superimposed on chronic kidney injury.    #Acute kidney injury superimposed on chronic kidney disease  #HFpEF exacerbation LVEF 55-60%  Patient is improving. His blood pressure remains elevated; other vital signs are stable. His lower extremity edema, orthopnea, PND and creatinine have improved since starting Lasix. Renal ultrasound showed no acute findings and normal appearing kidneys. Urine studies demonstrate elevated urine sodium and potassium and low urine osmolality which are less informative since collected after Lasix. Likely cause of AKI is cardiorenal syndrome with volume overload and congestion leading to a prerenal AKI.    -Awaiting nephrology consultation -Urine nitrogen pending -TTE scheduled  -IV Lasix 80mg  BID  -Daily RFP  -Strict I's and O's ( intake limit)  -Daily weight  #HTN Patient has a history of hypertension controlled at home with amlodipine, Lasix,  Imdur, Hydralazine and carvedilol. Blood pressure continues to remain elevated, recently 180/89. Likely that fluid overload is resulting in uncontrolled hypertension since symptoms gradually progressed.  -Continue Imdur 90mg  and hydralazine 100mg  TID.  -Hold amlodipine and carvedilol due to fluid overload  #Prediabetes A1c 6.1%. Controlled with lifestyle modification -Continue CBG TID with meals   #Normocytic anemia  Anemia is likely  due to CKD. Iron studies recently obtained. Patient takes daily oral iron, but has reported constipation.  -Hold oral iron  -Continue Senna prn  #Constipation Patient reports constipation in the setting of daily oral iron. He denied Senna this morning d/t having a BM.  -Continue senna prn in hospital  Diet: Renal IVF: None,None VTE: Enoxaparin Code: Full PT/OT recs: None, none. TOC recs: None  Family Update:   Dispo: Anticipated discharge to Home in 1 days pending fluid status.   Kristie Cowman, MS3 Pager Number [phone] Please contact the on call pager after 5 pm and on weekends at [phone].

## 2022-11-11 NOTE — ED Notes (Signed)
ED TO INPATIENT HANDOFF REPORT  ED Nurse Name and Phone #: Raquel Sarna [phone]  S Name/Age/Gender [name] 52 y.o. male Room/Bed: 023C/023C  Code Status   Code Status: Full Code  Home/SNF/Other Home Patient oriented to: self, place, time, and situation Is this baseline? Yes   Triage Complete: Triage complete  Chief Complaint Dyspnea [R06.00]  Triage Note Pt sent by physician for worsening kidney function and admission. Pt has had increasing swelling and shob when lying flat recently.  These symptoms are helped by elevating and taking lasix.  Pt has not noticed any decrease in his urinary output.    Allergies No Known Allergies  Level of Care/Admitting Diagnosis ED Disposition     ED Disposition  Admit   Condition  --   Comment  Hospital Area: [facility] [[id number]]  Level of Care: Telemetry Medical [[id number]]  May place patient in observation at [facility] or [facility] if equivalent level of care is available:: No  Covid Evaluation: Asymptomatic - no recent exposure (last 10 days) testing not required  Diagnosis: Dyspnea [098119]  Admitting Physician: Gust Rung [[id number]]  Attending Physician: Gust Rung [[id number]]          B Medical/Surgery History Past Medical History:  Diagnosis Date   (HFpEF) heart failure with preserved ejection fraction (HCC)    CKD (chronic kidney disease), stage IV (HCC)    Diabetes mellitus without complication (HCC)    Hypertension    Normocytic anemia    Past Surgical History:  Procedure Laterality Date   LEG SURGERY Right    teenager     A IV Location/Drains/Wounds Patient Lines/Drains/Airways Status     Active Line/Drains/Airways     Name Placement date Placement time Site Days   Peripheral IV 11/11/22 20 G 1" Right Antecubital 11/11/22  0125  Antecubital  less than 1            Intake/Output Last 24 hours No intake or output data in the 24 hours ending 11/11/22  0224  Labs/Imaging Results for orders placed or performed during the hospital encounter of 11/10/22 (from the past 48 hour(s))  Urinalysis, Routine w reflex microscopic -Urine, Clean Catch     Status: Abnormal   Collection Time: 11/10/22  5:44 PM  Result Value Ref Range   Color, Urine YELLOW YELLOW   APPearance HAZY (A) CLEAR   Specific Gravity, Urine 1.015 1.005 - 1.030   pH 5.0 5.0 - 8.0   Glucose, UA 150 (A) NEGATIVE mg/dL   Hgb urine dipstick SMALL (A) NEGATIVE   Bilirubin Urine NEGATIVE NEGATIVE   Ketones, ur NEGATIVE NEGATIVE mg/dL   Protein, ur >=147 (A) NEGATIVE mg/dL   Nitrite NEGATIVE NEGATIVE   Leukocytes,Ua NEGATIVE NEGATIVE   RBC / HPF 6-10 0 - 5 RBC/hpf   WBC, UA 6-10 0 - 5 WBC/hpf   Bacteria, UA RARE (A) NONE SEEN   Squamous Epithelial / HPF 0-5 0 - 5 /HPF   Mucus PRESENT     Comment: Performed at [facility], [address], [place], [place] [address]  CBC with Differential     Status: Abnormal   Collection Time: 11/10/22  5:57 PM  Result Value Ref Range   WBC 6.2 4.0 - 10.5 K/uL   RBC 3.18 (L) 4.22 - 5.81 MIL/uL   Hemoglobin 8.9 (L) 13.0 - 17.0 g/dL   HCT 21.3 (L) 08.6 - 57.8 %   MCV 87.4 80.0 - 100.0 fL  MCH 28.0 26.0 - 34.0 pg   MCHC 32.0 30.0 - 36.0 g/dL   RDW 16.1 09.6 - 04.5 %   Platelets 248 150 - 400 K/uL   nRBC 0.0 0.0 - 0.2 %   Neutrophils Relative % 61 %   Neutro Abs 3.7 1.7 - 7.7 K/uL   Lymphocytes Relative 19 %   Lymphs Abs 1.2 0.7 - 4.0 K/uL   Monocytes Relative 15 %   Monocytes Absolute 0.9 0.1 - 1.0 K/uL   Eosinophils Relative 4 %   Eosinophils Absolute 0.2 0.0 - 0.5 K/uL   Basophils Relative 1 %   Basophils Absolute 0.1 0.0 - 0.1 K/uL   Immature Granulocytes 0 %   Abs Immature Granulocytes 0.02 0.00 - 0.07 K/uL    Comment: Performed at [facility], [address], [place], [place] [address]  Comprehensive metabolic panel     Status: Abnormal   Collection Time: 11/10/22  5:57 PM  Result Value Ref Range   Sodium 139  135 - 145 mmol/L   Potassium 3.5 3.5 - 5.1 mmol/L   Chloride 108 98 - 111 mmol/L   CO2 20 (L) 22 - 32 mmol/L   Glucose, Bld 145 (H) 70 - 99 mg/dL    Comment: Glucose reference range applies only to samples taken after fasting for at least 8 hours.   BUN 38 (H) 6 - 20 mg/dL   Creatinine, Ser 1.91 (H) 0.61 - 1.24 mg/dL   Calcium 7.3 (L) 8.9 - 10.3 mg/dL   Total Protein 5.3 (L) 6.5 - 8.1 g/dL   Albumin 2.3 (L) 3.5 - 5.0 g/dL   AST 17 15 - 41 U/L   ALT 16 0 - 44 U/L   Alkaline Phosphatase 74 38 - 126 U/L   Total Bilirubin 0.5 0.3 - 1.2 mg/dL   GFR, Estimated 15 (L) >60 mL/min    Comment: (NOTE) Calculated using the CKD-EPI Creatinine Equation (2021)    Anion gap 11 5 - 15    Comment: Performed at [facility], [address] [place], [place] [address]  Brain natriuretic peptide     Status: Abnormal   Collection Time: 11/10/22  5:57 PM  Result Value Ref Range   B Natriuretic Peptide 574.3 (H) 0.0 - 100.0 pg/mL    Comment: Performed at [facility], [address] [place], [place] [address]   DG Chest 2 View  Result Date: 11/10/2022 CLINICAL DATA:  Shortness of breath. EXAM: CHEST - 2 VIEW COMPARISON:  June 24, 2022 FINDINGS: The cardiac silhouette is mildly enlarged and unchanged in size. Mild prominence of the perihilar pulmonary vasculature is noted. There is no evidence of an acute infiltrate, pleural effusion or pneumothorax. The visualized skeletal structures are unremarkable. IMPRESSION: Mild cardiomegaly and mild pulmonary vascular congestion without evidence of acute or active cardiopulmonary disease. Electronically Signed   By: Aram Candela M.D.   On: 11/10/2022 18:24    Pending Labs Unresulted Labs (From admission, onward)    None       Vitals/Pain Today's Vitals   11/10/22 1743 11/11/22 0057 11/11/22 0100 11/11/22 0115  BP: (!) 210/103 (!) 210/107 (!) 216/100   Pulse: 77 61 76 78  Resp: 16 14 16 16   Temp: 98.2 F (36.8 C) 98.4 F (36.9 C)     TempSrc: Oral Oral    SpO2: 97% 100% 100% 100%  PainSc:        Isolation Precautions No active isolations  Medications  Medications  amLODipine (NORVASC) tablet 5 mg (has no administration in time range)  atorvastatin (LIPITOR) tablet 40 mg (has no administration in time range)  hydrALAZINE (APRESOLINE) tablet 100 mg (has no administration in time range)  isosorbide mononitrate (IMDUR) 24 hr tablet 90 mg (has no administration in time range)  albuterol (PROVENTIL) (2.5 MG/3ML) 0.083% nebulizer solution 2.5 mg (has no administration in time range)  heparin injection 5,000 Units (has no administration in time range)  sodium chloride flush (NS) 0.9 % injection 3 mL (has no administration in time range)  acetaminophen (TYLENOL) tablet 650 mg (has no administration in time range)    Or  acetaminophen (TYLENOL) suppository 650 mg (has no administration in time range)  senna (SENOKOT) tablet 8.6 mg (has no administration in time range)  furosemide (LASIX) injection 80 mg (80 mg Intravenous Given 11/11/22 0130)    Mobility walks     Focused Assessments Cardiac Assessment Handoff:  Cardiac Rhythm: Normal sinus rhythm No results found for: "CKTOTAL", "CKMB", "CKMBINDEX", "TROPONINI" No results found for: "DDIMER" Does the Patient currently have chest pain? No    R Recommendations: See Admitting Provider Note  Report given to:   Additional Notes: Pt a/o x 4 able to ambulate

## 2022-11-11 NOTE — Consult Note (Signed)
[facility] Renal Consultation Note  Requesting MD: Mikey Bussing Indication for Consultation: advanced CKD  HPI:  Samuel Gardner is a 52 y.o. male with DM, HTN,  HpEF and stage 4 CKD followed by Dr. Malen Gauze at [facility]  ( baseline crt between 3-4).  Pt saw the cardiologist on 8/28  c/oworsening SOB, LE edema-  had been on lasix 40 BID.  Labs found crt of 4.16 which was slightly different from April where crt had been 3 so pt was directed to the ER-  2 labs since-  crt 4.47 and 4.28 this AM-  was given IV lasix with 1700 UOP overnight and 1300 out already today.  BP has been high.  He says he feels much better-  no appetite or energy issues.  Is taking this opportunity though to speak with someone in the hospital regarding disability  Creatinine, Ser  Date/Time Value Ref Range Status  11/11/2022 03:41 AM 4.28 (H) 0.61 - 1.24 mg/dL Final  54/27/0623 76:28 PM 4.47 (H) 0.61 - 1.24 mg/dL Final  31/51/7616 07:37 PM 4.16 (H) 0.76 - 1.27 mg/dL Final  10/62/6948 54:62 PM 3.04 (H) 0.61 - 1.24 mg/dL Final  70/35/0093 81:82 PM 2.87 (H) 0.76 - 1.27 mg/dL Final  99/37/1696 78:93 AM 2.91 (H) 0.61 - 1.24 mg/dL Final  81/03/7508 25:85 PM 2.85 (H) 0.61 - 1.24 mg/dL Final  27/78/2423 53:61 PM 2.91 (H) 0.61 - 1.24 mg/dL Final  44/31/5400 86:76 PM 3.11 (H) 0.76 - 1.27 mg/dL Final  19/50/9326 71:24 AM 2.68 (H) 0.61 - 1.24 mg/dL Final  58/11/9831 82:50 PM 2.57 (H) 0.61 - 1.24 mg/dL Final  53/97/6734 19:37 AM 2.81 (H) 0.61 - 1.24 mg/dL Final  90/24/0973 53:29 AM 2.59 (H) 0.76 - 1.27 mg/dL Final  92/42/6834 19:62 PM 2.76 (H) 0.76 - 1.27 mg/dL Final  22/97/9892 11:94 AM 3.23 (H) 0.61 - 1.24 mg/dL Final  17/40/8144 81:85 AM 3.79 (H) 0.61 - 1.24 mg/dL Final  63/14/9702 63:78 AM 3.21 (H) 0.61 - 1.24 mg/dL Final  58/85/0277 41:28 AM 2.62 (H) 0.61 - 1.24 mg/dL Final  78/67/6720 94:70 PM 2.50 (H) 0.61 - 1.24 mg/dL Final  96/28/3662 94:76 AM 1.01 0.61 - 1.24 mg/dL Final  54/65/0354 65:68 PM 0.99 0.50 - 1.35  mg/dL Final     PMHx:   Past Medical History:  Diagnosis Date   (HFpEF) heart failure with preserved ejection fraction (HCC)    CKD (chronic kidney disease), stage IV (HCC)    Diabetes mellitus without complication (HCC)    Hypertension    Normocytic anemia     Past Surgical History:  Procedure Laterality Date   LEG SURGERY Right    teenager    Family Hx:  Family History  Adopted: Yes  Problem Relation Age of Onset   Diabetes Mother    Hypertension Mother    Kidney disease Mother    Diabetes Sister    Hypertension Sister     Social History:  reports that he quit smoking about 9 months ago. His smoking use included cigarettes. He has never used smokeless tobacco. He reports that he does not currently use alcohol. He reports that he does not use drugs.  Allergies: No Known Allergies  Medications: Prior to Admission medications   Medication Sig Start Date End Date Taking? Authorizing Provider  albuterol (VENTOLIN HFA) 108 (90 Base) MCG/ACT inhaler Inhale 2 puffs into the lungs every 6 (six) hours as needed for wheezing or shortness of breath. 02/02/22  Yes Leatha Gilding, MD  amLODipine (NORVASC) 5 MG tablet Take 1 tablet (5 mg total) by mouth daily. 05/23/22  Yes [name], [name], MD  atorvastatin (LIPITOR) 40 MG tablet Take 1 tablet (40 mg total) by mouth daily. 11/09/22 02/07/23 Yes [name], Cornelious Bryant, MD  carvedilol (COREG) 25 MG tablet Take 1 tablet (25 mg total) by mouth 2 (two) times daily. 04/06/22  Yes [name], Cornelious Bryant, MD  cholecalciferol (VITAMIN D3) 25 MCG (1000 UNIT) tablet Take 1,000 Units by mouth daily.   Yes [provider]  ferrous sulfate 325 (65 FE) MG EC tablet Take 325 mg by mouth in the morning and at bedtime.   Yes [provider]  furosemide (LASIX) 40 MG tablet Take 1 tablet (40 mg total) by mouth 2 (two) times daily. 07/06/22 11/11/23 Yes Mercie Eon, MD  hydrALAZINE (APRESOLINE) 100 MG tablet Take 1 tablet (100 mg total) by  mouth 3 (three) times daily. 11/09/22  Yes [name], Cornelious Bryant, MD  isosorbide mononitrate (IMDUR) 30 MG 24 hr tablet Take 3 tablets (90 mg total) by mouth daily. 05/19/22  Yes [name], [name], MD  KIONEX 15 GM/60ML suspension Take 15 g by mouth 3 (three) times a week. 11/07/22  Yes [provider]  nitroGLYCERIN (NITROSTAT) 0.4 MG SL tablet Place 1 tablet (0.4 mg total) under the tongue every 5 (five) minutes as needed for chest pain. Patient not taking: Reported on 11/11/2022 02/07/22   Sharlene Dory, [name]    I have reviewed the patient's current medications.  Labs:  Results for orders placed or performed during the hospital encounter of 11/10/22 (from the past 48 hour(s))  Urinalysis, Routine w reflex microscopic -Urine, Clean Catch     Status: Abnormal   Collection Time: 11/10/22  5:44 PM  Result Value Ref Range   Color, Urine YELLOW YELLOW   APPearance HAZY (A) CLEAR   Specific Gravity, Urine 1.015 1.005 - 1.030   pH 5.0 5.0 - 8.0   Glucose, UA 150 (A) NEGATIVE mg/dL   Hgb urine dipstick SMALL (A) NEGATIVE   Bilirubin Urine NEGATIVE NEGATIVE   Ketones, ur NEGATIVE NEGATIVE mg/dL   Protein, ur >=469 (A) NEGATIVE mg/dL   Nitrite NEGATIVE NEGATIVE   Leukocytes,Ua NEGATIVE NEGATIVE   RBC / HPF 6-10 0 - 5 RBC/hpf   WBC, UA 6-10 0 - 5 WBC/hpf   Bacteria, UA RARE (A) NONE SEEN   Squamous Epithelial / HPF 0-5 0 - 5 /HPF   Mucus PRESENT     Comment: Performed at [facility], [address], [place], [place] [address]  CBC with Differential     Status: Abnormal   Collection Time: 11/10/22  5:57 PM  Result Value Ref Range   WBC 6.2 4.0 - 10.5 K/uL   RBC 3.18 (L) 4.22 - 5.81 MIL/uL   Hemoglobin 8.9 (L) 13.0 - 17.0 g/dL   HCT 84.1 (L) 32.4 - 40.1 %   MCV 87.4 80.0 - 100.0 fL   MCH 28.0 26.0 - 34.0 pg   MCHC 32.0 30.0 - 36.0 g/dL   RDW 02.7 25.3 - 66.4 %   Platelets 248 150 - 400 K/uL   nRBC 0.0 0.0 - 0.2 %   Neutrophils Relative % 61 %   Neutro Abs 3.7 1.7 - 7.7  K/uL   Lymphocytes Relative 19 %   Lymphs Abs 1.2 0.7 - 4.0 K/uL   Monocytes Relative 15 %   Monocytes Absolute 0.9 0.1 - 1.0 K/uL   Eosinophils Relative 4 %  Eosinophils Absolute 0.2 0.0 - 0.5 K/uL   Basophils Relative 1 %   Basophils Absolute 0.1 0.0 - 0.1 K/uL   Immature Granulocytes 0 %   Abs Immature Granulocytes 0.02 0.00 - 0.07 K/uL    Comment: Performed at [facility], [address], [place], [place] [address]  Comprehensive metabolic panel     Status: Abnormal   Collection Time: 11/10/22  5:57 PM  Result Value Ref Range   Sodium 139 135 - 145 mmol/L   Potassium 3.5 3.5 - 5.1 mmol/L   Chloride 108 98 - 111 mmol/L   CO2 20 (L) 22 - 32 mmol/L   Glucose, Bld 145 (H) 70 - 99 mg/dL    Comment: Glucose reference range applies only to samples taken after fasting for at least 8 hours.   BUN 38 (H) 6 - 20 mg/dL   Creatinine, Ser 4.09 (H) 0.61 - 1.24 mg/dL   Calcium 7.3 (L) 8.9 - 10.3 mg/dL   Total Protein 5.3 (L) 6.5 - 8.1 g/dL   Albumin 2.3 (L) 3.5 - 5.0 g/dL   AST 17 15 - 41 U/L   ALT 16 0 - 44 U/L   Alkaline Phosphatase 74 38 - 126 U/L   Total Bilirubin 0.5 0.3 - 1.2 mg/dL   GFR, Estimated 15 (L) >60 mL/min    Comment: (NOTE) Calculated using the CKD-EPI Creatinine Equation (2021)    Anion gap 11 5 - 15    Comment: Performed at [facility], [address], [place], [place] [address]  Brain natriuretic peptide     Status: Abnormal   Collection Time: 11/10/22  5:57 PM  Result Value Ref Range   B Natriuretic Peptide 574.3 (H) 0.0 - 100.0 pg/mL    Comment: Performed at [facility], [address], [place], [place] [address]  Renal function panel     Status: Abnormal   Collection Time: 11/11/22  3:41 AM  Result Value Ref Range   Sodium 141 135 - 145 mmol/L   Potassium 3.6 3.5 - 5.1 mmol/L   Chloride 110 98 - 111 mmol/L   CO2 22 22 - 32 mmol/L   Glucose, Bld 101 (H) 70 - 99 mg/dL    Comment: Glucose reference range applies only to samples taken  after fasting for at least 8 hours.   BUN 38 (H) 6 - 20 mg/dL   Creatinine, Ser 5.62 (H) 0.61 - 1.24 mg/dL   Calcium 7.8 (L) 8.9 - 10.3 mg/dL   Phosphorus 4.5 2.5 - 4.6 mg/dL   Albumin 2.3 (L) 3.5 - 5.0 g/dL   GFR, Estimated 16 (L) >60 mL/min    Comment: (NOTE) Calculated using the CKD-EPI Creatinine Equation (2021)    Anion gap 9 5 - 15    Comment: Performed at [facility], [address] [place], [place] [address]  Magnesium     Status: None   Collection Time: 11/11/22  3:41 AM  Result Value Ref Range   Magnesium 1.7 1.7 - 2.4 mg/dL    Comment: Performed at [facility], [address] [place], [place] [address]  Hemoglobin A1c     Status: Abnormal   Collection Time: 11/11/22  3:41 AM  Result Value Ref Range   Hgb A1c MFr Bld 6.1 (H) 4.8 - 5.6 %    Comment: (NOTE) Pre diabetes:          5.7%-6.4%  Diabetes:              >  6.4%  Glycemic control for   <7.0% adults with diabetes    Mean Plasma Glucose 128.37 mg/dL    Comment: Performed at [facility], [address] [place], [place] [address]  Glucose, capillary     Status: None   Collection Time: 11/11/22  3:41 AM  Result Value Ref Range   Glucose-Capillary 89 70 - 99 mg/dL    Comment: Glucose reference range applies only to samples taken after fasting for at least 8 hours.  Creatinine, urine, random     Status: None   Collection Time: 11/11/22  5:51 AM  Result Value Ref Range   Creatinine, Urine 18 mg/dL    Comment: Performed at [facility], [address], [place], [place] [address]  Osmolality, urine     Status: Abnormal   Collection Time: 11/11/22  5:51 AM  Result Value Ref Range   Osmolality, Ur 287 (L) 300 - 900 mOsm/kg    Comment: Performed at [facility], [address] [place], [place] [address]  Na and K (sodium & potassium), rand urine     Status: None   Collection Time: 11/11/22  5:51 AM  Result Value Ref Range   Sodium, Ur 122 mmol/L   Potassium Urine 6 mmol/L     Comment: Performed at [facility], [address], [place], [place] [address]  Glucose, capillary     Status: Abnormal   Collection Time: 11/11/22  9:38 AM  Result Value Ref Range   Glucose-Capillary 114 (H) 70 - 99 mg/dL    Comment: Glucose reference range applies only to samples taken after fasting for at least 8 hours.  Glucose, capillary     Status: None   Collection Time: 11/11/22 11:32 AM  Result Value Ref Range   Glucose-Capillary 94 70 - 99 mg/dL    Comment: Glucose reference range applies only to samples taken after fasting for at least 8 hours.     ROS:  A comprehensive review of systems was negative except for: Cardiovascular: positive for lower extremity edema and paroxysmal nocturnal dyspnea  Physical Exam: Vitals:   11/11/22 0342 11/11/22 0753  BP: (!) 194/84 (!) 180/89  Pulse: 68 (!) 58  Resp:  17  Temp: 98.7 F (37.1 C) 98.6 F (37 C)  SpO2: 97% 100%     General:  well appearing BM-  NAD HEENT: PERRLA, EOMI, mucous membranes moist  Neck: no JVD Heart: RRR Lungs: mostly clear Abdomen: soft, non tender Extremities: min edema if any  Skin: warm and dry Neuro: non focal   Assessment/Plan: 52 year old BM with advanced CKD at baseline-  followed by [facility]-  presents with volume overload sxms and hypertension 1.Renal- advanced CKD at baseline-  likely multifactorial ( DM, CHF)  GFR is not that different from where It has been as OP.  He is not uremic.  There are no indications for dialysis even though his albumin is low-  he denies appetite disturbance 2. Hypertension/volume  - he was overloaded on a lasix dose that was too low for him at this time given his decreased GFR-  he has responded well to IV diuretics-  sxms of overload appear much better.  I am ok with continuing IV lasix another 24 hours -  then when discharged would send out on 80 mg po BID ( double his previous OP dose)  also I will add back norvasc given that BP is still high -  ok to  hold coreg  given bradycardia 3. Anemia  - likely due to CKD and could be contributing to his SOB  -  will give ESA and check iron panel 4. Dispo-  if kidney function is not a lot worse than now and he continues to diurese and feels symptomatically improved feel like he could be discharged with close renal follow up on lasix 80 bid   Cecille Aver 11/11/2022, 2:29 PM

## 2022-11-11 NOTE — Plan of Care (Signed)
  Problem: Education: Goal: Knowledge of General Education information will improve Description Including pain rating scale, medication(s)/side effects and non-pharmacologic comfort measures Outcome: Progressing   Problem: Clinical Measurements: Goal: Will remain free from infection Outcome: Progressing Goal: Respiratory complications will improve Outcome: Progressing   Problem: Activity: Goal: Risk for activity intolerance will decrease Outcome: Progressing   Problem: Coping: Goal: Level of anxiety will decrease Outcome: Progressing   Problem: Pain Managment: Goal: General experience of comfort will improve Outcome: Progressing   Problem: Safety: Goal: Ability to remain free from injury will improve Outcome: Progressing   

## 2022-11-11 NOTE — H&P (Signed)
Date: 11/11/2022               Patient Name:  Samuel Gardner MRN: [id number]  DOB: [birth year] Age / Sex: 52 y.o., male   PCP: Rocky Morel, DO         Medical Service: Internal Medicine Teaching Service         Attending Physician: Dr. Gust Rung, DO      First Contact: Dr. Lovie Macadamia MD Pager [phone]    Second Contact: Dr. Rocky Morel, DO Pager [phone]         After Hours (After 5p/  First Contact Pager: [phone]  weekends / holidays): Second Contact Pager: [phone]   SUBJECTIVE   Chief Complaint: AKI  History of Present Illness: Samuel Gardner is a 52 year old male with a past medical history of HFpEF, CKD stage IV, T2DM, and HTN who presented to the emergency department for further evaluation of an acute kidney injury superimposed on chronic kidney disease after the cardiologist noted an increase in creatinine on labs during an appointment for a follow up when the patient was complaining of increasing shortness of breathing and bilateral lower extremity edema.   Today, the patient denied SOB currently but did report orthopnea and PND. He reported an increase in swelling of his b/l lower extremities, SOB, and orthopnea that began 10 days prior. Both patient and wife stated that when he retains fluid, he experiences LE edema.  He denied medication non adherence, changes in diet (increased sodium intake),recent infection, alcohol use, illicit drug use, and tobacco use.   Patient denied palpitations, chest pain, feeling light headed, headaches, changes in vision, pleuritic chest pain, recent travel, or recent surgeries.   Patient regularly follow with nephrology, Dr. Malen Gauze. He denies changes in urine frequency and amount. He denies medication non adherence or increasing lasix dose. Both patient and wife were recently educated on dialysis in a class and believe that he will require dialysis soon. He denies symptoms of uremia.     Medications Imdur 90 mg  daily Amlodipine 5 mg  Carvedilol 25 mg BID Lasix 40 mg BID Iron pills Lipitor 40 mg daily Nitroglycerine PRN Previously on Kionex (need to verify) Vitamin D 1000 units daily   Past Medical History HFpEF CKD stage IV T2DM HTN  T2DM  Social:  Lives With: Wife and daughter Occupation: Looking for a job, would like help with disability paperwork Support: Wife and daughter for support Level of Function:Independent ADLs and iADLs PCP: [facility] Substances: Former tobacco user, 30 pack years, denies alcohol and drug use  Family History: Mother: HTN and Renal diease   Allergies: Allergies as of 11/10/2022   (No Known Allergies)    Review of Systems: A complete ROS was negative except as per HPI.   OBJECTIVE:   Vitals:   11/11/22 0215 11/11/22 0342  BP: (!) 222/104 (!) 194/84  Pulse: 74 68  Resp: 16   Temp:  98.7 F (37.1 C)  SpO2: 100% 97%    Physical Exam: Constitutional: well-appearing, sitting in bed, in no acute distress HENT: normocephalic atraumatic, mucous membranes moist Cardiovascular: regular rate and rhythm, no m/r/g, JVD, hepatojugular reflex present  Pulmonary/Chest: normal work of breathing on room air, lungs clear to auscultation bilaterally. No crackles  Abdominal: soft, non-tender, non-distended.  Neurological: alert & oriented x 3 MSK: no gross abnormalities. 1+pitting edema present in bilateral lower extremities, pulses present in bilateral radial and bilateral DP Skin: warm and dry  Psych: Normal mood and affect  Labs: CBC    Component Value Date/Time   WBC 6.2 11/10/2022 1757   RBC 3.18 (L) 11/10/2022 1757   HGB 8.9 (L) 11/10/2022 1757   HGB 10.2 (L) 02/22/2022 1617   HCT 27.8 (L) 11/10/2022 1757   HCT 31.0 (L) 02/22/2022 1617   PLT 248 11/10/2022 1757   PLT 317 02/22/2022 1617   MCV 87.4 11/10/2022 1757   MCV 89 02/22/2022 1617   MCH 28.0 11/10/2022 1757   MCHC 32.0 11/10/2022 1757   RDW 14.4 11/10/2022 1757   RDW 12.3  02/22/2022 1617   LYMPHSABS 1.2 11/10/2022 1757   LYMPHSABS 1.4 02/22/2022 1617   MONOABS 0.9 11/10/2022 1757   EOSABS 0.2 11/10/2022 1757   EOSABS 0.3 02/22/2022 1617   BASOSABS 0.1 11/10/2022 1757   BASOSABS 0.1 02/22/2022 1617     CMP     Component Value Date/Time   NA 141 11/11/2022 0341   NA 141 11/09/2022 1351   K 3.6 11/11/2022 0341   CL 110 11/11/2022 0341   CO2 22 11/11/2022 0341   GLUCOSE 101 (H) 11/11/2022 0341   BUN 38 (H) 11/11/2022 0341   BUN 40 (H) 11/09/2022 1351   CREATININE 4.28 (H) 11/11/2022 0341   CALCIUM 7.8 (L) 11/11/2022 0341   PROT 5.3 (L) 11/10/2022 1757   ALBUMIN 2.3 (L) 11/11/2022 0341   AST 17 11/10/2022 1757   ALT 16 11/10/2022 1757   ALKPHOS 74 11/10/2022 1757   BILITOT 0.5 11/10/2022 1757   GFRNONAA 16 (L) 11/11/2022 0341   GFRAA >60 07/24/2016 1115    Imaging: DG Chest 2 View  Result Date: 11/10/2022 CLINICAL DATA:  Shortness of breath. EXAM: CHEST - 2 VIEW COMPARISON:  June 24, 2022 FINDINGS: The cardiac silhouette is mildly enlarged and unchanged in size. Mild prominence of the perihilar pulmonary vasculature is noted. There is no evidence of an acute infiltrate, pleural effusion or pneumothorax. The visualized skeletal structures are unremarkable. IMPRESSION: Mild cardiomegaly and mild pulmonary vascular congestion without evidence of acute or active cardiopulmonary disease. Electronically Signed   By: Aram Candela M.D.   On: 11/10/2022 18:24      EKG: personally reviewed my interpretation is normal sinus rhythm with a Qtc of 457, no signs of ischemia . Prior EKG is sinus bradycardia with a Qtc of 483.  ASSESSMENT & PLAN:   Assessment & Plan by Problem: Principal Problem:   Acute kidney injury superimposed on chronic kidney disease (HCC) Active Problems:   Essential hypertension   Acute on chronic heart failure with preserved ejection fraction (HFpEF) (HCC)   CKD (chronic kidney disease), stage IV (HCC)   Normocytic  anemia   Samuel Gardner is a 52 y.o. person living with a history of  HFpEF, CKD stage IV, HTN, and prediabetes  who presented volume overload and admitted for acute kidney injury superimposed on chronic kidney disease on hospital day 0  # Acute kidney injury superimposed on chronic kidney disease #HFpEF Exacerbation LVEF 55-60% Patient presents orthopnea, volume overload on exam, pulmonary vascular congestion, BNP at 574.3 (prior 368.5), and worsening RFP since most recent OV at [facility]: 08/23/22: creatinine 3.97, GFR 17, 11/11/22: creatinine 4.47, GFR 15. Likely prerenal in setting of congestion vs low output state. Intrarenal causes are unlikely, U/A does not show casts. Post renal causes are unlikely, he does not have a history of BPH, though will further evaluate with renal US and urine studies. ACS ruled out on  admission. Patient has not had change in his diuretics for months despite progressive fluid overload state. Low in the differential at this point include medication noncompliance, diet, Arrhythmias, valvular disease,or pulmonary embolism. Now s/p 80mg  IV lasix with unmeasured UOP but mild improvement in Cr.  Plan: -Formal Nephrology consult in the AM -Monitor RFP and electrolytes -Consider second dose of IV lasix -Telemetry  -Follow TTE and Renal US -Urine studies ordered: urine nitrogen, urine osmolality, urine sodium and potassium  -Strict Is and Os -Daily standing weights  #HTN  Patient has a history of hypertension on a home regimen of amlodipine, Lasix, Imdur, Hydralazine, and Coreg. His blood pressure on admission increased to 222/104 without medications for past 24 hours since arriving at ED. Though hypertensive at admission, low suspicion that this is driving current exacerbation. The fluid overload is likely causing the uncontrolled HTN. He is currently asymptomatic Plan: -Restart home regimen of Imdur 90mg  , Hydralazine 100mg  TID -Will hold Amlodipine  5mg  and Coreg in setting of fluid overload  #Prediabetes Prior A1c 5.9, controlled with lifestyle changes -CBG TID, prior to meals  -A1c pending  #Constipation Patient has constipation likely due to the oral iron supplement -Senna started   #Normocytic anemia Likely in the setting of CKD. Prior Iron studies: Iron Binding capacity 206, Saturation 21%, and ferritin 367. Patient is taking oral iron daily, compliant with worsening constipation -Will hold oral iron due to constipation  -Increase BM regimen as needed  Diet: Renal VTE: Enoxaparin Code: Full  Prior to Admission Living Arrangement: Home, living with wife and daughter Anticipated Discharge Location: Home Barriers to Discharge: Medical Treatment   Dispo: Admit patient to Observation with expected length of stay less than 2 midnights.  Signed:   Faith Rogue  Internal Medicine Resident PGY-1 11/11/2022, 5:25 AM   Please contact the on call pager after 5 pm and on weekends at [phone].

## 2022-11-12 ENCOUNTER — Other Ambulatory Visit (HOSPITAL_COMMUNITY): Payer: Self-pay

## 2022-11-12 DIAGNOSIS — I130 Hypertensive heart and chronic kidney disease with heart failure and stage 1 through stage 4 chronic kidney disease, or unspecified chronic kidney disease: Secondary | ICD-10-CM | POA: Diagnosis not present

## 2022-11-12 DIAGNOSIS — N184 Chronic kidney disease, stage 4 (severe): Secondary | ICD-10-CM

## 2022-11-12 DIAGNOSIS — Z87891 Personal history of nicotine dependence: Secondary | ICD-10-CM | POA: Diagnosis not present

## 2022-11-12 DIAGNOSIS — I5033 Acute on chronic diastolic (congestive) heart failure: Secondary | ICD-10-CM | POA: Diagnosis not present

## 2022-11-12 DIAGNOSIS — N179 Acute kidney failure, unspecified: Secondary | ICD-10-CM

## 2022-11-12 LAB — CBC
HCT: 28.7 % — ABNORMAL LOW (ref 39.0–52.0)
Hemoglobin: 9.3 g/dL — ABNORMAL LOW (ref 13.0–17.0)
MCH: 27.8 pg (ref 26.0–34.0)
MCHC: 32.4 g/dL (ref 30.0–36.0)
MCV: 85.7 fL (ref 80.0–100.0)
Platelets: 201 10*3/uL (ref 150–400)
RBC: 3.35 MIL/uL — ABNORMAL LOW (ref 4.22–5.81)
RDW: 14.3 % (ref 11.5–15.5)
WBC: 7.6 10*3/uL (ref 4.0–10.5)
nRBC: 0 % (ref 0.0–0.2)

## 2022-11-12 LAB — GLUCOSE, CAPILLARY
Glucose-Capillary: 92 mg/dL (ref 70–99)
Glucose-Capillary: 102 mg/dL — ABNORMAL HIGH (ref 70–99)

## 2022-11-12 LAB — RENAL FUNCTION PANEL
Albumin: 2.2 g/dL — ABNORMAL LOW (ref 3.5–5.0)
Anion gap: 14 (ref 5–15)
BUN: 45 mg/dL — ABNORMAL HIGH (ref 6–20)
CO2: 17 mmol/L — ABNORMAL LOW (ref 22–32)
Calcium: 7.8 mg/dL — ABNORMAL LOW (ref 8.9–10.3)
Chloride: 106 mmol/L (ref 98–111)
Creatinine, Ser: 4.83 mg/dL — ABNORMAL HIGH (ref 0.61–1.24)
GFR, Estimated: 14 mL/min — ABNORMAL LOW (ref >60)
Glucose, Bld: 76 mg/dL (ref 70–99)
Phosphorus: 4.3 mg/dL (ref 2.5–4.6)
Potassium: 3.6 mmol/L (ref 3.5–5.1)
Sodium: 137 mmol/L (ref 135–145)

## 2022-11-12 LAB — IRON AND TIBC
Iron: 38 ug/dL — ABNORMAL LOW (ref 45–182)
Saturation Ratios: 18 % (ref 17.9–39.5)
TIBC: 210 ug/dL — ABNORMAL LOW (ref 250–450)
UIBC: 172 ug/dL

## 2022-11-12 LAB — FERRITIN: Ferritin: 252 ng/mL (ref 24–336)

## 2022-11-12 MED ORDER — POLYETHYLENE GLYCOL 3350 17 GM/SCOOP PO POWD
17.0000 g | Freq: Every day | ORAL | 0 refills | Status: DC | PRN
  Filled 2022-11-12: qty 238, 14d supply, fill #0

## 2022-11-12 MED ORDER — SODIUM CHLORIDE 0.9 % IV SOLN
250.0000 mg | Freq: Every day | INTRAVENOUS | Status: DC
  Administered 2022-11-12: 250 mg via INTRAVENOUS
  Filled 2022-11-12: qty 20

## 2022-11-12 MED ORDER — FUROSEMIDE 40 MG PO TABS: 80.0000 mg | ORAL_TABLET | Freq: Two times a day (BID) | ORAL | Status: DC

## 2022-11-12 MED ORDER — FUROSEMIDE 80 MG PO TABS: 80.0000 mg | ORAL_TABLET | Freq: Two times a day (BID) | ORAL | 3 refills | Status: DC

## 2022-11-12 MED ORDER — FUROSEMIDE 80 MG PO TABS
80.0000 mg | ORAL_TABLET | Freq: Two times a day (BID) | ORAL | 3 refills | Status: DC
  Filled 2022-11-12: qty 60, 30d supply, fill #0

## 2022-11-12 NOTE — TOC Transition Note (Signed)
 Discharge medications are filled and stored in the [facility] awaiting patient discharge.

## 2022-11-12 NOTE — Plan of Care (Signed)

## 2022-11-12 NOTE — Progress Notes (Signed)
Subjective:  5100 of UOP-  negative 4800-  BP is still high  -  weight down to 173-  BUN and crt up some but not surprising given the volume of diuresis-  he thinks he is going home today   Objective Vital signs in last 24 hours: Vitals:   11/11/22 2159 11/12/22 0500 11/12/22 0542 11/12/22 0739  BP: (!) 189/92  (!) 190/92 (!) 183/93  Pulse:    65  Resp:    13  Temp:    98.3 F (36.8 C)  TempSrc:    Oral  SpO2:    100%  Weight:  78.6 kg     Weight change:   Intake/Output Summary (Last 24 hours) at 11/12/2022 1054 Last data filed at 11/12/2022 0800 Gross per 24 hour  Intake 243 ml  Output 5400 ml  Net -5157 ml   Assessment/Plan: 52 year old BM with advanced CKD at baseline-  followed by [facility]-  presents with volume overload sxms and hypertension 1.Renal- advanced CKD at baseline-  likely multifactorial ( DM, CHF)  GFR is not that different from where It has been as OP.  He is not uremic.  There are no indications for dialysis even though his albumin is low-  he denies appetite disturbance 2. Hypertension/volume  - he was overloaded on a lasix dose that was too low for him at this time given his decreased GFR-  he has responded well to IV diuretics-  sxms of overload appear much better.  Will stop iv and send out on 80 mg po BID ( double his previous OP dose)  also I will add back norvasc given that BP is still high -  ok to hold coreg given bradycardia 3. Anemia  - likely due to CKD and could be contributing to his SOB  -  will give ESA and check iron panel-  give one dose of iron today  4. Dispo-  i feel like he could be discharged with close renal follow up on lasix 80 bid-  will make sure he has close follow up with [facility]     Cecille Aver    Labs: Basic Metabolic Panel: Recent Labs  Lab 11/10/22 1757 11/11/22 0341 11/12/22 0341  NA 139 141 137  K 3.5 3.6 3.6  CL 108 110 106  CO2 20* 22 17*  GLUCOSE 145* 101* 76  BUN 38* 38* 45*  CREATININE 4.47* 4.28* 4.83*   CALCIUM 7.3* 7.8* 7.8*  PHOS  --  4.5 4.3   Liver Function Tests: Recent Labs  Lab 11/10/22 1757 11/11/22 0341 11/12/22 0341  AST 17  --   --   ALT 16  --   --   ALKPHOS 74  --   --   BILITOT 0.5  --   --   PROT 5.3*  --   --   ALBUMIN 2.3* 2.3* 2.2*   No results for input(s): "LIPASE", "AMYLASE" in the last 168 hours. No results for input(s): "AMMONIA" in the last 168 hours. CBC: Recent Labs  Lab 11/10/22 1757 11/12/22 0341  WBC 6.2 7.6  NEUTROABS 3.7  --   HGB 8.9* 9.3*  HCT 27.8* 28.7*  MCV 87.4 85.7  PLT 248 201   Cardiac Enzymes: No results for input(s): "CKTOTAL", "CKMB", "CKMBINDEX", "TROPONINI" in the last 168 hours. CBG: Recent Labs  Lab 11/11/22 0341 11/11/22 0938 11/11/22 1132 11/12/22 0742  GLUCAP 89 114* 94 92    Iron Studies:  Recent Labs  11/12/22 0341  IRON 38*  TIBC 210*  FERRITIN 252   Studies/Results: ECHOCARDIOGRAM COMPLETE  Result Date: 11/11/2022    ECHOCARDIOGRAM REPORT   Patient Name:   Samuel Gardner Date of Exam: 11/11/2022 Medical Rec #:  [id number]         Height:       67.0 in Accession #:    [id number]        Weight:       181.0 lb Date of Birth:  1970/07/13          BSA:          1.938 m Patient Age:    52 years          BP:           194/84 mmHg Patient Gender: M                 HR:           69 bpm. Exam Location:  Inpatient Procedure: 2D Echo, Cardiac Doppler and Color Doppler Indications:    Dyspnea R06.00, CHF I50.9  History:        Patient has prior history of Echocardiogram examinations, most                 recent 01/30/2022. CHF, Signs/Symptoms:Dyspnea; Risk                 Factors:Hypertension, Current Smoker and Diabetes. CKD, stage 4.  Sonographer:    Lucendia Herrlich Referring Phys: [id number] ERIK C HOFFMAN IMPRESSIONS  1. Left ventricular ejection fraction, by estimation, is 50 to 55%. The left ventricle has low normal function. The left ventricle has no regional wall motion abnormalities. There is moderate left  ventricular hypertrophy. Left ventricular diastolic parameters are consistent with Grade I diastolic dysfunction (impaired relaxation). Elevated left ventricular end-diastolic pressure. The E/e' is 19.  2. Right ventricular systolic function is normal. The right ventricular size is normal. There is normal pulmonary artery systolic pressure. The estimated right ventricular systolic pressure is 23.7 mmHg.  3. Left atrial size was severely dilated.  4. The mitral valve is abnormal. Mild mitral valve regurgitation.  5. The aortic valve is tricuspid. Aortic valve regurgitation is not visualized.  6. Aortic dilatation noted. There is mild dilatation of the ascending aorta, measuring 41 mm.  7. The inferior vena cava is dilated in size with >50% respiratory variability, suggesting right atrial pressure of 8 mmHg. Comparison(s): Changes from prior study are noted. 01/30/2022: LVEF 55-60%. FINDINGS  Left Ventricle: Left ventricular ejection fraction, by estimation, is 50 to 55%. The left ventricle has low normal function. The left ventricle has no regional wall motion abnormalities. The left ventricular internal cavity size was normal in size. There is moderate left ventricular hypertrophy. Left ventricular diastolic parameters are consistent with Grade I diastolic dysfunction (impaired relaxation). Elevated left ventricular end-diastolic pressure. The E/e' is 94. Right Ventricle: The right ventricular size is normal. No increase in right ventricular wall thickness. Right ventricular systolic function is normal. There is normal pulmonary artery systolic pressure. The tricuspid regurgitant velocity is 1.98 m/s, and  with an assumed right atrial pressure of 8 mmHg, the estimated right ventricular systolic pressure is 23.7 mmHg. Left Atrium: Left atrial size was severely dilated. Right Atrium: Right atrial size was normal in size. Pericardium: Trivial pericardial effusion is present. The pericardial effusion is posterior to the  left ventricle. Mitral Valve: The mitral valve is abnormal. There is mild calcification of the  anterior and posterior mitral valve leaflet(s). Mild mitral valve regurgitation. Tricuspid Valve: The tricuspid valve is grossly normal. Tricuspid valve regurgitation is trivial. Aortic Valve: The aortic valve is tricuspid. Aortic valve regurgitation is not visualized. Aortic valve peak gradient measures 11.0 mmHg. Pulmonic Valve: The pulmonic valve was grossly normal. Pulmonic valve regurgitation is trivial. Aorta: Aortic dilatation noted. There is mild dilatation of the ascending aorta, measuring 41 mm. Venous: The inferior vena cava is dilated in size with greater than 50% respiratory variability, suggesting right atrial pressure of 8 mmHg. IAS/Shunts: No atrial level shunt detected by color flow Doppler.  LEFT VENTRICLE PLAX 2D LVIDd:         5.10 cm   Diastology LVIDs:         3.80 cm   LV e' medial:    6.74 cm/s LV PW:         1.60 cm   LV E/e' medial:  19.1 LV IVS:        1.40 cm   LV e' lateral:   6.53 cm/s LVOT diam:     2.20 cm   LV E/e' lateral: 19.8 LV SV:         106 LV SV Index:   55 LVOT Area:     3.80 cm  RIGHT VENTRICLE             IVC RV S prime:     16.50 cm/s  IVC diam: 2.50 cm TAPSE (M-mode): 2.5 cm LEFT ATRIUM              Index        RIGHT ATRIUM           Index LA diam:        4.90 cm  2.53 cm/m   RA Area:     19.00 cm LA Vol (A2C):   118.0 ml 60.88 ml/m  RA Volume:   50.60 ml  26.11 ml/m LA Vol (A4C):   98.2 ml  50.69 ml/m LA Biplane Vol: 109.0 ml 56.24 ml/m  AORTIC VALVE                 PULMONIC VALVE AV Area (Vmax): 2.91 cm     PR End Diast Vel: 12.96 msec AV Vmax:        166.00 cm/s AV Peak Grad:   11.0 mmHg LVOT Vmax:      127.00 cm/s LVOT Vmean:     79.000 cm/s LVOT VTI:       0.280 m  AORTA Ao Root diam: 3.30 cm Ao Asc diam:  4.25 cm MITRAL VALVE                TRICUSPID VALVE MV Area (PHT): 3.77 cm     TR Peak grad:   15.7 mmHg MV Decel Time: 201 msec     TR Vmax:        198.00  cm/s MR Peak grad: 79.2 mmHg MR Vmax:      445.00 cm/s   SHUNTS MV E velocity: 129.00 cm/s  Systemic VTI:  0.28 m MV A velocity: 117.00 cm/s  Systemic Diam: 2.20 cm MV E/A ratio:  1.10 Zoila Shutter MD Electronically signed by Zoila Shutter MD Signature Date/Time: 11/11/2022/6:15:08 PM    Final    US RENAL  Result Date: 11/11/2022 CLINICAL DATA:  Acute kidney injury. EXAM: RENAL / URINARY TRACT ULTRASOUND COMPLETE COMPARISON:  01/31/2022 FINDINGS: Right Kidney: Renal measurements: 12.2 x 5.2 x 5.9 cm = volume: 196  mL. Echogenicity within normal limits. No mass or hydronephrosis visualized. Small amount of perinephric fluid or edema. Left Kidney: Renal measurements: 12.0 x 7.3 x 6.2 cm = volume: 283 mL. Echogenicity within normal limits. No mass or hydronephrosis visualized. Bladder: Mild irregularity of the bladder wall is similar to the previous examination. Fluid in the urinary bladder. Other: None. IMPRESSION: 1. Normal appearance of both kidneys without hydronephrosis. 2. Small amount of right perinephric fluid. Electronically Signed   By: Richarda Overlie M.D.   On: 11/11/2022 09:19   DG Chest 2 View  Result Date: 11/10/2022 CLINICAL DATA:  Shortness of breath. EXAM: CHEST - 2 VIEW COMPARISON:  June 24, 2022 FINDINGS: The cardiac silhouette is mildly enlarged and unchanged in size. Mild prominence of the perihilar pulmonary vasculature is noted. There is no evidence of an acute infiltrate, pleural effusion or pneumothorax. The visualized skeletal structures are unremarkable. IMPRESSION: Mild cardiomegaly and mild pulmonary vascular congestion without evidence of acute or active cardiopulmonary disease. Electronically Signed   By: Aram Candela M.D.   On: 11/10/2022 18:24   Medications: Infusions:   Scheduled Medications:  amLODipine  5 mg Oral Daily   atorvastatin  40 mg Oral Daily   darbepoetin (ARANESP) injection - NON-DIALYSIS  100 mcg Subcutaneous Q Fri-1800   enoxaparin (LOVENOX) injection   30 mg Subcutaneous Q24H   furosemide  80 mg Intravenous BID   hydrALAZINE  100 mg Oral Q8H   isosorbide mononitrate  90 mg Oral Daily   senna  1 tablet Oral BID   sodium chloride flush  3 mL Intravenous Q12H    have reviewed scheduled and prn medications.  Physical Exam: General:   NAD Heart: borderline brady Lungs: clear Abdomen: soft, non tender Extremities: no edema    11/12/2022,10:54 AM  LOS: 1 day

## 2022-11-12 NOTE — TOC Initial Note (Signed)
Transition of Care [facility]) - Initial/Assessment Note    Patient Details  Name: Samuel Gardner MRN: [id number] Date of Birth: 03-Jun-1970  Transition of Care [facility]) CM/SW Contact:    Mearl Latin, LCSW Phone Number: 11/12/2022, 11:15 AM  Clinical Narrative:                 CSW received request to assist patient in applying for Disability. CSW Neurosurgeon Counseling to request screening. CSW placed follow up info on patient's AVS in the event Financial Counseling is unable to assist.     Barriers to Discharge: Continued Medical Work up   Patient Goals and CMS Choice            Expected Discharge Plan and Services                                              Prior Living Arrangements/Services     Patient language and need for interpreter reviewed:: Yes        Need for Family Participation in Patient Care: No (Comment) Care giver support system in place?: No (comment)   Criminal Activity/Legal Involvement Pertinent to Current Situation/Hospitalization: No - Comment as needed  Activities of Daily Living Home Assistive Devices/Equipment: Other (Comment) (glucometer) ADL Screening (condition at time of admission) Patient's cognitive ability adequate to safely complete daily activities?: Yes Is the patient deaf or have difficulty hearing?: No Does the patient have difficulty seeing, even when wearing glasses/contacts?: No Does the patient have difficulty concentrating, remembering, or making decisions?: No Patient able to express need for assistance with ADLs?: Yes Does the patient have difficulty dressing or bathing?: No Independently performs ADLs?: Yes (appropriate for developmental age) Does the patient have difficulty walking or climbing stairs?: No Weakness of Legs: None Weakness of Arms/Hands: None  Permission Sought/Granted                  Emotional Assessment Appearance:: Appears stated age     Orientation: : Oriented to Self,  Oriented to Place, Oriented to  Time, Oriented to Situation Alcohol / Substance Use: Not Applicable Psych Involvement: No (comment)  Admission diagnosis:  Dyspnea [R06.00] Acute renal failure superimposed on chronic kidney disease, unspecified acute renal failure type, unspecified CKD stage (HCC) [N17.9, N18.9] Acute on chronic diastolic (congestive) heart failure (HCC) [I50.33] Patient Active Problem List   Diagnosis Date Noted   Dyspnea 11/11/2022   Acute on chronic heart failure with preserved ejection fraction (HFpEF) (HCC) 11/11/2022   Acute on chronic diastolic (congestive) heart failure (HCC) 11/11/2022   Colon cancer screening declined 07/06/2022   Tobacco use 05/23/2022   Normocytic anemia 02/23/2022   CKD (chronic kidney disease), stage IV (HCC) 02/10/2022   (HFpEF) heart failure with preserved ejection fraction (HCC) 02/10/2022   Essential hypertension 02/10/2022   Hyperkalemia 02/09/2022   Acute kidney injury superimposed on chronic kidney disease (HCC) 01/30/2022   PCP:  Rocky Morel, DO Pharmacy:   [id number] - [place], [place] - [address] [place] [address] Phone: [phone] Fax: [phone]     Social Determinants of Health (SDOH) Social History: SDOH Screenings   Food Insecurity: No Food Insecurity (11/11/2022)  Housing: Low Risk  (11/11/2022)  Transportation Needs: No Transportation Needs (11/11/2022)  Utilities: Not At Risk (11/11/2022)  Depression (PHQ2-9): Low Risk  (05/23/2022)  Social Connections: Moderately  Isolated (02/23/2022)  Tobacco Use: Medium Risk (11/10/2022)   SDOH Interventions:     Readmission Risk Interventions     No data to display

## 2022-11-12 NOTE — Progress Notes (Signed)
[name] to be D/C'd  per MD order.  Discussed with the patient and all questions fully answered.  VSS, Skin clean, dry and intact without evidence of skin break down, no evidence of skin tears noted.  IV catheter discontinued intact. Site without signs and symptoms of complications. Dressing and pressure applied.  An After Visit Summary was printed and given to the patient. Patient received prescription.  D/c education completed with patient/family including follow up instructions, medication list, d/c activities limitations if indicated, with other d/c instructions as indicated by MD - patient able to verbalize understanding, all questions fully answered.   Patient instructed to return to ED, call 911, or call MD for any changes in condition.   Patient to be escorted via WC, and D/C home via private auto.

## 2022-11-12 NOTE — Discharge Instructions (Addendum)
It was a pleasure taking care of you during this admission and we appreciate you letting us take care of you.  Please continue to take your home medications with one adjustment:  Increase your lasix dose to Lasix 80mg  BID  We have sent a message to our clinic to schedule your next PCP appointment with the Internal Medicine Clinic on the [place] end of the ground floor of [facility]. Please also continue to follow up with your nephrologist and cardiologist.  If you have any return of your shortness of breath, edema, or other concerning symptoms please call the clinic or come to the hospital.   Kristie Cowman   To Apply for Medicaid:   [facility] Department of Social Services Address: [facility]  [address], [place], [place] [address] [facility]  [address] [place], [place] [address]   [id number], [place] [address] State Courier #: [id number] Phone: [phone] Fax Number: [phone]  To Apply for Disability:  [address] [facility]. [place] [address] Phone: [phone] Fax: [phone]

## 2022-11-12 NOTE — Discharge Summary (Addendum)
Name: Samuel Gardner MRN: [id number] DOB: 03-Mar-1971 52 y.o. PCP: Rocky Morel, DO  Date of Admission: 11/10/2022  5:36 PM Date of Discharge: 11/12/2022 Attending Physician: Dr. Heide Spark  Discharge Diagnosis: Principal Problem:   Acute kidney injury superimposed on chronic kidney disease (HCC) Active Problems:   CKD (chronic kidney disease), stage IV (HCC)   Essential hypertension   Normocytic anemia   Acute on chronic heart failure with preserved ejection fraction (HFpEF) (HCC)   Acute on chronic diastolic (congestive) heart failure (HCC)    Discharge Medications: Allergies as of 11/12/2022   No Known Allergies      Medication List     TAKE these medications    albuterol 108 (90 Base) MCG/ACT inhaler Commonly known as: VENTOLIN HFA Inhale 2 puffs into the lungs every 6 (six) hours as needed for wheezing or shortness of breath.   amLODipine 5 MG tablet Commonly known as: NORVASC Take 1 tablet (5 mg total) by mouth daily.   atorvastatin 40 MG tablet Commonly known as: LIPITOR Take 1 tablet (40 mg total) by mouth daily.   carvedilol 25 MG tablet Commonly known as: COREG Take 1 tablet (25 mg total) by mouth 2 (two) times daily.   cholecalciferol 25 MCG (1000 UNIT) tablet Commonly known as: VITAMIN D3 Take 1,000 Units by mouth daily.   ferrous sulfate 325 (65 FE) MG EC tablet Take 325 mg by mouth in the morning and at bedtime.   furosemide 80 MG tablet Commonly known as: LASIX Take 1 tablet (80 mg total) by mouth 2 (two) times daily. Start taking on: November 13, 2022 What changed:  medication strength how much to take   hydrALAZINE 100 MG tablet Commonly known as: APRESOLINE Take 1 tablet (100 mg total) by mouth 3 (three) times daily.   isosorbide mononitrate 30 MG 24 hr tablet Commonly known as: IMDUR Take 3 tablets (90 mg total) by mouth daily.   Kionex 15 GM/60ML suspension Generic drug: sodium polystyrene Take 15 g by mouth 3 (three) times  a week.   nitroGLYCERIN 0.4 MG SL tablet Commonly known as: NITROSTAT Place 1 tablet (0.4 mg total) under the tongue every 5 (five) minutes as needed for chest pain.   polyethylene glycol 17 g packet Commonly known as: MiraLax Take 17 g by mouth daily as needed for mild constipation.        Disposition and follow-up:   Samuel Gardner was discharged from [facility] in Good condition.  At the hospital follow up visit please address:  1.  Follow-up:  a. Fluid status with increased Lasix dose    b. Blood pressure control     c. Creatinine level  2.  Labs / imaging needed at time of follow-up: Renal function panel   3.  Pending labs/ test needing follow-up: None  4.  Medication Changes  Lasix - 80mg  BID     Follow-up Appointments:   Hospital Course by problem list: # Acute kidney injury superimposed on chronic kidney disease #HFpEF Exacerbation LVEF 55-60% Patient presented to the ED on 8/29 due to the recommendation of his cardiologist because of an increased creatinine to 4.16 from baseline 3.97. He reported orthopnea, PND, bilateral LE edema and shortness of breath. His BP was 222/104; other vitals within normal limits. At the ED his creatinine was 4.47, GFR 15, BUN 38, BNP 574.3. CXR showed mild pulmonary congestion. Renal ultrasound demonstrated no acute findings with normal appearing kidneys. Echo showed LVEF 50-55% and Grade  I LV diastolic dysfunction consistent with his previous echo in 01/2022.  AKI likely due to cardiorenal syndrome from fluid overload and congestion resulting in a prerenal AKI. Lasix was given which improved his symptoms. Nephrology was consulted and recommended to discharge with increased dose of Lasix to 80mg  BID.    #HTN  Patient has a history of hypertension on a home regimen of amlodipine, Lasix, Imdur, Hydralazine, and Coreg. Blood pressure on admission was 222/104 and remained elevated during his stay.  Hypertension likely  due to fluid overload due to gradual progression of symptom onset. Imdur and hydralazine were continued during the stay, while amlodipine and carvedilol were held due to fluid overload. All medications restarted on discharge.  #Prediabetes Previous A1c 5.9%; current A1c 6.1%. Continued control with lifestyle modifications and CBG prior to meals.   #Normocytic anemia #Constipation Patient has anemia in the setting of CKD. Patient takes daily oral iron. Nephrology started Darbepoeitin Alfa. He reported constipation, likely due to oral iron. Oral iron held for one day and Senna given which resolved the constipation.   #Disability follow-up Social work will coordinate with financial counselor to call Samuel Gardner at home to help with his disability paperwork.     Discharge Subjective: Samuel Gardner is well-appearing, sitting up in bed. He reports his shortness of breath, orthopnea and PND as well as his lower extremity edema has resolved. He feels well and is ready to go home. He requested to discuss disability forms which will be addressed outside of the hospital.   Discharge Exam:   BP (!) 183/93 (BP Location: Left Arm)   Pulse 65   Temp 98.3 F (36.8 C) (Oral)   Resp 13   Wt 78.6 kg   SpO2 100%   BMI 27.14 kg/m  Constitutional: well-appearing, sitting in bed, in no acute distress HENT: normocephalic atraumatic, mucous membranes moist Eyes: conjunctiva non-erythematous Neck: supple Cardiovascular: regular rate and rhythm, no m/r/g Pulmonary/Chest: normal work of breathing on room air, lungs clear to auscultation bilaterally Abdominal: soft, non-tender, non-distended MSK: normal bulk and tone Neurological: alert & oriented x 3, 5/5 strength in bilateral upper and lower extremities, normal gait Skin: warm and dry   Pertinent Labs, Studies, and Procedures:     Latest Ref Rng & Units 11/12/2022    3:41 AM 11/10/2022    5:57 PM 06/24/2022    5:19 PM  CBC  WBC 4.0 - 10.5 K/uL 7.6   6.2  7.2   Hemoglobin 13.0 - 17.0 g/dL 9.3  8.9  9.4   Hematocrit 39.0 - 52.0 % 28.7  27.8  29.7   Platelets 150 - 400 K/uL 201  248  248        Latest Ref Rng & Units 11/12/2022    3:41 AM 11/11/2022    3:41 AM 11/10/2022    5:57 PM  CMP  Glucose 70 - 99 mg/dL 76  161  096   BUN 6 - 20 mg/dL 45  38  38   Creatinine 0.61 - 1.24 mg/dL 0.45  4.09  8.11   Sodium 135 - 145 mmol/L 137  141  139   Potassium 3.5 - 5.1 mmol/L 3.6  3.6  3.5   Chloride 98 - 111 mmol/L 106  110  108   CO2 22 - 32 mmol/L 17  22  20    Calcium 8.9 - 10.3 mg/dL 7.8  7.8  7.3   Total Protein 6.5 - 8.1 g/dL   5.3   Total  Bilirubin 0.3 - 1.2 mg/dL   0.5   Alkaline Phos 38 - 126 U/L   74   AST 15 - 41 U/L   17   ALT 0 - 44 U/L   16     ECHOCARDIOGRAM COMPLETE  Result Date: 11/11/2022    ECHOCARDIOGRAM REPORT   Patient Name:   Samuel Gardner Date of Exam: 11/11/2022 Medical Rec #:  [id number]         Height:       67.0 in Accession #:    [id number]        Weight:       181.0 lb Date of Birth:  10-22-1970          BSA:          1.938 m Patient Age:    52 years          BP:           194/84 mmHg Patient Gender: M                 HR:           69 bpm. Exam Location:  Inpatient Procedure: 2D Echo, Cardiac Doppler and Color Doppler Indications:    Dyspnea R06.00, CHF I50.9  History:        Patient has prior history of Echocardiogram examinations, most                 recent 01/30/2022. CHF, Signs/Symptoms:Dyspnea; Risk                 Factors:Hypertension, Current Smoker and Diabetes. CKD, stage 4.  Sonographer:    Lucendia Herrlich Referring Phys: [id number] ERIK C HOFFMAN IMPRESSIONS  1. Left ventricular ejection fraction, by estimation, is 50 to 55%. The left ventricle has low normal function. The left ventricle has no regional wall motion abnormalities. There is moderate left ventricular hypertrophy. Left ventricular diastolic parameters are consistent with Grade I diastolic dysfunction (impaired relaxation). Elevated left  ventricular end-diastolic pressure. The E/e' is 19.  2. Right ventricular systolic function is normal. The right ventricular size is normal. There is normal pulmonary artery systolic pressure. The estimated right ventricular systolic pressure is 23.7 mmHg.  3. Left atrial size was severely dilated.  4. The mitral valve is abnormal. Mild mitral valve regurgitation.  5. The aortic valve is tricuspid. Aortic valve regurgitation is not visualized.  6. Aortic dilatation noted. There is mild dilatation of the ascending aorta, measuring 41 mm.  7. The inferior vena cava is dilated in size with >50% respiratory variability, suggesting right atrial pressure of 8 mmHg. Comparison(s): Changes from prior study are noted. 01/30/2022: LVEF 55-60%. FINDINGS  Left Ventricle: Left ventricular ejection fraction, by estimation, is 50 to 55%. The left ventricle has low normal function. The left ventricle has no regional wall motion abnormalities. The left ventricular internal cavity size was normal in size. There is moderate left ventricular hypertrophy. Left ventricular diastolic parameters are consistent with Grade I diastolic dysfunction (impaired relaxation). Elevated left ventricular end-diastolic pressure. The E/e' is 19. Right Ventricle: The right ventricular size is normal. No increase in right ventricular wall thickness. Right ventricular systolic function is normal. There is normal pulmonary artery systolic pressure. The tricuspid regurgitant velocity is 1.98 m/s, and  with an assumed right atrial pressure of 8 mmHg, the estimated right ventricular systolic pressure is 23.7 mmHg. Left Atrium: Left atrial size was severely dilated. Right Atrium: Right atrial size was normal in size.  Pericardium: Trivial pericardial effusion is present. The pericardial effusion is posterior to the left ventricle. Mitral Valve: The mitral valve is abnormal. There is mild calcification of the anterior and posterior mitral valve leaflet(s). Mild  mitral valve regurgitation. Tricuspid Valve: The tricuspid valve is grossly normal. Tricuspid valve regurgitation is trivial. Aortic Valve: The aortic valve is tricuspid. Aortic valve regurgitation is not visualized. Aortic valve peak gradient measures 11.0 mmHg. Pulmonic Valve: The pulmonic valve was grossly normal. Pulmonic valve regurgitation is trivial. Aorta: Aortic dilatation noted. There is mild dilatation of the ascending aorta, measuring 41 mm. Venous: The inferior vena cava is dilated in size with greater than 50% respiratory variability, suggesting right atrial pressure of 8 mmHg. IAS/Shunts: No atrial level shunt detected by color flow Doppler.  LEFT VENTRICLE PLAX 2D LVIDd:         5.10 cm   Diastology LVIDs:         3.80 cm   LV e' medial:    6.74 cm/s LV PW:         1.60 cm   LV E/e' medial:  19.1 LV IVS:        1.40 cm   LV e' lateral:   6.53 cm/s LVOT diam:     2.20 cm   LV E/e' lateral: 19.8 LV SV:         106 LV SV Index:   55 LVOT Area:     3.80 cm  RIGHT VENTRICLE             IVC RV S prime:     16.50 cm/s  IVC diam: 2.50 cm TAPSE (M-mode): 2.5 cm LEFT ATRIUM              Index        RIGHT ATRIUM           Index LA diam:        4.90 cm  2.53 cm/m   RA Area:     19.00 cm LA Vol (A2C):   118.0 ml 60.88 ml/m  RA Volume:   50.60 ml  26.11 ml/m LA Vol (A4C):   98.2 ml  50.69 ml/m LA Biplane Vol: 109.0 ml 56.24 ml/m  AORTIC VALVE                 PULMONIC VALVE AV Area (Vmax): 2.91 cm     PR End Diast Vel: 12.96 msec AV Vmax:        166.00 cm/s AV Peak Grad:   11.0 mmHg LVOT Vmax:      127.00 cm/s LVOT Vmean:     79.000 cm/s LVOT VTI:       0.280 m  AORTA Ao Root diam: 3.30 cm Ao Asc diam:  4.25 cm MITRAL VALVE                TRICUSPID VALVE MV Area (PHT): 3.77 cm     TR Peak grad:   15.7 mmHg MV Decel Time: 201 msec     TR Vmax:        198.00 cm/s MR Peak grad: 79.2 mmHg MR Vmax:      445.00 cm/s   SHUNTS MV E velocity: 129.00 cm/s  Systemic VTI:  0.28 m MV A velocity: 117.00 cm/s  Systemic  Diam: 2.20 cm MV E/A ratio:  1.10 Zoila Shutter MD Electronically signed by Zoila Shutter MD Signature Date/Time: 11/11/2022/6:15:08 PM    Final    US RENAL  Result Date: 11/11/2022 CLINICAL  DATA:  Acute kidney injury. EXAM: RENAL / URINARY TRACT ULTRASOUND COMPLETE COMPARISON:  01/31/2022 FINDINGS: Right Kidney: Renal measurements: 12.2 x 5.2 x 5.9 cm = volume: 196 mL. Echogenicity within normal limits. No mass or hydronephrosis visualized. Small amount of perinephric fluid or edema. Left Kidney: Renal measurements: 12.0 x 7.3 x 6.2 cm = volume: 283 mL. Echogenicity within normal limits. No mass or hydronephrosis visualized. Bladder: Mild irregularity of the bladder wall is similar to the previous examination. Fluid in the urinary bladder. Other: None. IMPRESSION: 1. Normal appearance of both kidneys without hydronephrosis. 2. Small amount of right perinephric fluid. Electronically Signed   By: Richarda Overlie M.D.   On: 11/11/2022 09:19   DG Chest 2 View  Result Date: 11/10/2022 CLINICAL DATA:  Shortness of breath. EXAM: CHEST - 2 VIEW COMPARISON:  June 24, 2022 FINDINGS: The cardiac silhouette is mildly enlarged and unchanged in size. Mild prominence of the perihilar pulmonary vasculature is noted. There is no evidence of an acute infiltrate, pleural effusion or pneumothorax. The visualized skeletal structures are unremarkable. IMPRESSION: Mild cardiomegaly and mild pulmonary vascular congestion without evidence of acute or active cardiopulmonary disease. Electronically Signed   By: Aram Candela M.D.   On: 11/10/2022 18:24     Discharge Instructions: Discharge Instructions     Call MD for:  difficulty breathing, headache or visual disturbances   Complete by: As directed    Call MD for:  extreme fatigue   Complete by: As directed    Call MD for:  persistant dizziness or light-headedness   Complete by: As directed    Call MD for:  persistant nausea and vomiting   Complete by: As directed     Call MD for:  severe uncontrolled pain   Complete by: As directed    Call MD for:  temperature >100.4   Complete by: As directed    Diet - low sodium heart healthy   Complete by: As directed    Increase activity slowly   Complete by: As directed        Signed: Kristie Cowman, MS3 Pager# [phone] 11/12/2022, 12:31 PM   Please contact the on call pager after 5 pm and on weekends at [phone].   Attestation for Student Documentation:  I personally was present and re-performed the history, physical exam and medical decision-making activities of this service and have verified that the service and findings are accurately documented in the student's note.  Rocky Morel, DO 11/12/2022, 3:12 PM

## 2022-11-13 LAB — UREA NITROGEN, URINE: Urea Nitrogen, Ur: 104 mg/dL

## 2022-11-15 ENCOUNTER — Other Ambulatory Visit (HOSPITAL_COMMUNITY): Payer: Self-pay

## 2022-11-17 ENCOUNTER — Telehealth: Payer: Self-pay | Admitting: Cardiology

## 2022-11-17 ENCOUNTER — Ambulatory Visit (HOSPITAL_COMMUNITY): Payer: 59 | Attending: Cardiology

## 2022-11-17 DIAGNOSIS — I5032 Chronic diastolic (congestive) heart failure: Secondary | ICD-10-CM | POA: Insufficient documentation

## 2022-11-17 DIAGNOSIS — I083 Combined rheumatic disorders of mitral, aortic and tricuspid valves: Secondary | ICD-10-CM | POA: Diagnosis not present

## 2022-11-17 DIAGNOSIS — I5030 Unspecified diastolic (congestive) heart failure: Secondary | ICD-10-CM

## 2022-11-17 DIAGNOSIS — I77810 Thoracic aortic ectasia: Secondary | ICD-10-CM | POA: Diagnosis not present

## 2022-11-17 LAB — ECHOCARDIOGRAM COMPLETE
Area-P 1/2: 4.15 cm2
S' Lateral: 3.9 cm

## 2022-11-17 NOTE — Telephone Encounter (Signed)
Hello, patient dropped FMLA forms off for his wife to take off to care for him. They will be in [name] by the end of the day. Thank you

## 2022-11-17 NOTE — Telephone Encounter (Signed)
Patient did pay for forms.Marland Kitchen 11-17-2022

## 2022-11-18 ENCOUNTER — Encounter: Payer: Self-pay | Admitting: Cardiology

## 2022-11-18 ENCOUNTER — Telehealth: Payer: Self-pay

## 2022-11-18 DIAGNOSIS — I77810 Thoracic aortic ectasia: Secondary | ICD-10-CM | POA: Insufficient documentation

## 2022-11-18 DIAGNOSIS — I340 Nonrheumatic mitral (valve) insufficiency: Secondary | ICD-10-CM | POA: Insufficient documentation

## 2022-11-18 NOTE — Telephone Encounter (Signed)
-----   Message from Armanda Magic sent at 11/18/2022  8:58 AM EDT ----- 2D echo with normal EF 60-65%,moderately enlarged left upper chamber of heart called left atrium, mildly leaky MV, mildly calcified AV with no AS and mildly dilated ascending aorta.

## 2022-11-18 NOTE — Telephone Encounter (Signed)
Reviewed with patient and spouse that 2D echo shows normal EF 60-65%,moderately enlarged left upper chamber of heart called left atrium, mildly leaky MV, mildly calcified AV with no AS and mildly dilated ascending aorta. Patient and spouse verbalize understanding.

## 2022-11-18 NOTE — Telephone Encounter (Signed)
Call to patient and spouse to let them know that Dr. Mayford Knife recommends that patient get his FMLA paperwork filled out by nephrology since diuretics are managed by them and he is on them for his kidneys. Patient and spouse verbalize understanding.

## 2022-11-21 ENCOUNTER — Telehealth: Payer: Self-pay | Admitting: Cardiology

## 2022-11-21 NOTE — Telephone Encounter (Signed)
Patient needs refund for forms.

## 2022-11-21 NOTE — Telephone Encounter (Signed)
Patient was returning call. Please advise ?

## 2022-11-21 NOTE — Telephone Encounter (Signed)
Called and lvm for Samuel Gardner, refund will be in front office waiting.

## 2022-11-21 NOTE — Telephone Encounter (Signed)
Spoke with pt verbally explained that his refund is waiting in the front office for pick up during normal business hours.

## 2022-11-23 ENCOUNTER — Telehealth (HOSPITAL_COMMUNITY): Payer: Self-pay | Admitting: Licensed Clinical Social Worker

## 2022-11-23 NOTE — Telephone Encounter (Signed)
Providers office reached out to CSW regarding speaking with pt regarding Medicaid and disability.  After reviewing financial counseling notes it appears as if he was being screened for eligibility.  CSW spoke with financial counselor who confirms they will help submit application for medicaid and refer for assistance with disability.  Believes he is over reserve limit for medicaid but will still submit application at this time.  No additional CSW needs at this time  Burna Sis, LCSW Clinical Social Worker [facility] Desk#: [phone] Cell#: [phone]

## 2022-11-24 DIAGNOSIS — N1832 Chronic kidney disease, stage 3b: Secondary | ICD-10-CM | POA: Diagnosis not present

## 2022-11-28 DIAGNOSIS — N2581 Secondary hyperparathyroidism of renal origin: Secondary | ICD-10-CM | POA: Diagnosis not present

## 2022-11-28 DIAGNOSIS — I5032 Chronic diastolic (congestive) heart failure: Secondary | ICD-10-CM | POA: Diagnosis not present

## 2022-11-28 DIAGNOSIS — D631 Anemia in chronic kidney disease: Secondary | ICD-10-CM | POA: Diagnosis not present

## 2022-11-28 DIAGNOSIS — I120 Hypertensive chronic kidney disease with stage 5 chronic kidney disease or end stage renal disease: Secondary | ICD-10-CM | POA: Diagnosis not present

## 2022-11-28 DIAGNOSIS — N185 Chronic kidney disease, stage 5: Secondary | ICD-10-CM | POA: Diagnosis not present

## 2022-11-28 DIAGNOSIS — E1122 Type 2 diabetes mellitus with diabetic chronic kidney disease: Secondary | ICD-10-CM | POA: Diagnosis not present

## 2022-11-28 DIAGNOSIS — E875 Hyperkalemia: Secondary | ICD-10-CM | POA: Diagnosis not present

## 2022-12-01 ENCOUNTER — Other Ambulatory Visit: Payer: Self-pay | Admitting: *Deleted

## 2022-12-01 ENCOUNTER — Ambulatory Visit (HOSPITAL_COMMUNITY)
Admission: RE | Admit: 2022-12-01 | Discharge: 2022-12-01 | Disposition: A | Discharge status: Home / Self Care | Payer: 59 | Source: Ambulatory Visit | Attending: Vascular Surgery | Admitting: Vascular Surgery

## 2022-12-01 ENCOUNTER — Ambulatory Visit (INDEPENDENT_AMBULATORY_CARE_PROVIDER_SITE_OTHER)
Admission: RE | Admit: 2022-12-01 | Discharge: 2022-12-01 | Disposition: A | Discharge status: Home / Self Care | Payer: 59 | Source: Ambulatory Visit | Attending: Vascular Surgery | Admitting: Vascular Surgery

## 2022-12-01 DIAGNOSIS — N184 Chronic kidney disease, stage 4 (severe): Secondary | ICD-10-CM | POA: Diagnosis not present

## 2022-12-01 NOTE — Progress Notes (Signed)
Office Note     CC:  CKD5 Requesting Provider:  Estanislado Emms, MD  HPI: Samuel Gardner is a Right handed 52 y.o. (09-23-1970) male with kidney disease who presents at the request of Samuel Emms, MD for permanent HD access.    On exam, Samuel Gardner was doing well, coming by his wife.  They met here in [place], and have been married for 8 years.  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  He was subsequently diagnosed with stage IV kidney disease not seen after an ED visit with a blood pressure greater than 210.  He is currently being managed by Dr. Vallery Gardner, who recommended vascular surgery referral for long-term HD access. Elward denies history of upper extremity surgery.  Denies numbness tingling in the hands.  He and his wife have been diligent in his chronic kidney disease path to dialysis, going to classes, and even setting up appointments for transplant education at [facility].   Past Medical History:  Diagnosis Date   (HFpEF) heart failure with preserved ejection fraction (HCC)    Ascending aorta dilatation (HCC)    40mm by echo 11/2022   CKD (chronic kidney disease), stage IV (HCC)    Diabetes mellitus without complication (HCC)    Hypertension    Mitral regurgitation    mild by echo 11/2022   Normocytic anemia     Past Surgical History:  Procedure Laterality Date   LEG SURGERY Right    teenager    Social History   Socioeconomic History   Marital status: Single    Spouse name: Not on file   Number of children: Not on file   Years of education: Not on file   Highest education level: Not on file  Occupational History   Not on file  Tobacco Use   Smoking status: Former    Current packs/day: 0.00    Types: Cigarettes    Quit date: 01/30/2022    Years since quitting: 0.8   Smokeless tobacco: Never  Substance and Sexual Activity   Alcohol use: Not Currently   Drug use: No   Sexual activity: Not on file  Other Topics Concern    Not on file  Social History Narrative   Not on file   Social Determinants of Health   Financial Resource Strain: Not on file  Food Insecurity: No Food Insecurity (11/11/2022)   Hunger Vital Sign    Worried About Running Out of Food in the Last Year: Never true    Ran Out of Food in the Last Year: Never true  Transportation Needs: No Transportation Needs (11/11/2022)   PRAPARE - Administrator, Civil Service (Medical): No    Lack of Transportation (Non-Medical): No  Physical Activity: Not on file  Stress: Not on file  Social Connections: Moderately Isolated (02/23/2022)   Social Connection and Isolation Panel [NHANES]    Frequency of Communication with Friends and Family: More than three times a week    Frequency of Social Gatherings with Friends and Family: More than three times a week    Attends Religious Services: Never    Database administrator or Organizations: No    Attends Banker Meetings: Never    Marital Status: Married  Catering manager Violence: Not At Risk (11/11/2022)   Humiliation, Afraid, Rape, and Kick questionnaire    Fear of Current or Ex-Partner: No    Emotionally Abused: No    Physically Abused: No  Sexually Abused: No   Family History  Adopted: Yes  Problem Relation Age of Onset   Diabetes Mother    Hypertension Mother    Kidney disease Mother    Diabetes Sister    Hypertension Sister     Current Outpatient Medications  Medication Sig Dispense Refill   albuterol (VENTOLIN HFA) 108 (90 Base) MCG/ACT inhaler Inhale 2 puffs into the lungs every 6 (six) hours as needed for wheezing or shortness of breath. 6.7 g 2   amLODipine (NORVASC) 5 MG tablet Take 1 tablet (5 mg total) by mouth daily. 90 tablet 3   atorvastatin (LIPITOR) 40 MG tablet Take 1 tablet (40 mg total) by mouth daily. 90 tablet 3   carvedilol (COREG) 25 MG tablet Take 1 tablet (25 mg total) by mouth 2 (two) times daily. 180 tablet 3   cholecalciferol (VITAMIN D3) 25  MCG (1000 UNIT) tablet Take 1,000 Units by mouth daily.     ferrous sulfate 325 (65 FE) MG EC tablet Take 325 mg by mouth in the morning and at bedtime.     furosemide (LASIX) 80 MG tablet Take 1 tablet (80 mg total) by mouth 2 (two) times daily. 120 tablet 3   hydrALAZINE (APRESOLINE) 100 MG tablet Take 1 tablet (100 mg total) by mouth 3 (three) times daily. 270 tablet 3   isosorbide mononitrate (IMDUR) 30 MG 24 hr tablet Take 3 tablets (90 mg total) by mouth daily. 270 tablet 3   KIONEX 15 GM/60ML suspension Take 15 g by mouth 3 (three) times a week.     nitroGLYCERIN (NITROSTAT) 0.4 MG SL tablet Place 1 tablet (0.4 mg total) under the tongue every 5 (five) minutes as needed for chest pain. (Patient not taking: Reported on 11/11/2022) 25 tablet 3   polyethylene glycol powder (GAVILAX) 17 GM/SCOOP powder Take 17 g by mouth daily as needed for mild constipation. 238 g 0   No current facility-administered medications for this visit.    No Known Allergies   REVIEW OF SYSTEMS:  [X]  denotes positive finding, [ ]  denotes negative finding Cardiac  Comments:  Chest pain or chest pressure:    Shortness of breath upon exertion: X   Short of breath when lying flat:    Irregular heart rhythm:        Vascular    Pain in calf, thigh, or hip brought on by ambulation:    Pain in feet at night that wakes you up from your sleep:     Blood clot in your veins:    Leg swelling:         Pulmonary    Oxygen at home:    Productive cough:     Wheezing:         Neurologic    Sudden weakness in arms or legs:  X   Sudden numbness in arms or legs:  X   Sudden onset of difficulty speaking or slurred speech:    Temporary loss of vision in one eye:     Problems with dizziness:  X       Gastrointestinal    Blood in stool:     Vomited blood:         Genitourinary    Burning when urinating:     Blood in urine:        Psychiatric    Depression:  X       Hematologic    Bleeding problems:    Problems  with blood clotting too  easily:        Skin    Rashes or ulcers:        Constitutional    Fever or chills:      PHYSICAL EXAMINATION:  There were no vitals filed for this visit.  General:  WDWN in NAD; vital signs documented above Gait: Not observed HENT: WNL, normocephalic Pulmonary: normal non-labored breathing , without Rales, rhonchi,  wheezing Cardiac: regular HR Abdomen: soft, NT, no masses Skin: without rashes Vascular Exam/Pulses:  Right Left  Radial 1+ (weak) 1+ (weak)  Ulnar 2+ (normal) 2+ (normal)  Femoral    Popliteal    DP    PT     Extremities: without ischemic changes, without Gangrene , without cellulitis; without open wounds;  Musculoskeletal: no muscle wasting or atrophy  Neurologic: A&O X 3;  No focal weakness or paresthesias are detected Psychiatric:  The pt has Normal affect.   Non-Invasive Vascular Imaging:   Right Cephalic     Diameter (cm)   Depth (cm)        Findings            +-----------------+------------------+----------+-------------------------+   Shoulder               0.59                                             +-----------------+------------------+----------+-------------------------+   Prox upper arm          0.48                                             +-----------------+------------------+----------+-------------------------+   Mid upper arm           0.36                                             +-----------------+------------------+----------+-------------------------+   Dist upper arm          0.45                                             +-----------------+------------------+----------+-------------------------+   Antecubital fossa0.35 / 0.41 / 0.62          branching and PC  thrombus  +-----------------+------------------+----------+-------------------------+   Prox forearm            0.31                                              +-----------------+------------------+----------+-------------------------+   Mid forearm          0.24 0.41                                           +-----------------+------------------+----------+-------------------------+   Dist forearm            0.21                                             +-----------------+------------------+----------+-------------------------+    +-----------------+-------------+----------+--------------+  Right Basilic    Diameter (cm)Depth (cm)   Findings     +-----------------+-------------+----------+--------------+  Shoulder                               not visualized  +-----------------+-------------+----------+--------------+  Prox upper arm       0.40                               +-----------------+-------------+----------+--------------+  Mid upper arm        0.36                               +-----------------+-------------+----------+--------------+  Dist upper arm       0.31                               +-----------------+-------------+----------+--------------+  Antecubital fossa    0.18                               +-----------------+-------------+----------+--------------+   +-----------------+-------------+----------+--------+  Left Cephalic    Diameter (cm)Depth (cm)Findings  +-----------------+-------------+----------+--------+  Shoulder            0.42                         +-----------------+-------------+----------+--------+  Prox upper arm    0.48 / 0.40                     +-----------------+-------------+----------+--------+  Mid upper arm        0.28                         +-----------------+-------------+----------+--------+  Dist upper arm    0.37 / 0.42                     +-----------------+-------------+----------+--------+  Antecubital fossa 0.60 / 0.36                     +-----------------+-------------+----------+--------+  Prox  forearm         0.29                         +-----------------+-------------+----------+--------+  Mid forearm          0.27                         +-----------------+-------------+----------+--------+  Dist forearm      0.29 / 0.19                     +-----------------+-------------+----------+--------+   +-----------------+-------------+----------+--------------+  Left Basilic     Diameter (cm)Depth (cm)   Findings     +-----------------+-------------+----------+--------------+  Shoulder                               not visualized  +-----------------+-------------+----------+--------------+  Prox upper arm       0.36                prox to mid    +-----------------+-------------+----------+--------------+  Mid upper arm  0.35                               +-----------------+-------------+----------+--------------+  Dist upper arm       0.43                 branching     +-----------------+-------------+----------+--------------+  Antecubital fossa    0.24                               +-----------------+-------------+----------+--------------+  Prox forearm         0.15                               +-----------------+-------------+----------+--------------+     ASSESSMENT/PLAN:  TAYMAR NIVAR is a 51 y.o. male who presents with chronic kidney disease stage 5  We had a long discussion regarding hemodialysis and fistula creation  Based on vein mapping and examination, [name] would be best served with left arm brachiocephalic fistula creation.  I was hoping that with his physique, and vein size, that he would be a candidate for left radiocephalic fistula, however he has a poor pulse with plaque and monophasic waveforms at the wrist. I had an extensive discussion with this patient in regards to the nature of access surgery, including risk, benefits, and alternatives.   The patient is aware that the risks of access surgery  include but are not limited to: bleeding, infection, steal syndrome, nerve damage, ischemic monomelic neuropathy, failure of access to mature, complications related to venous hypertension, and possible need for additional access procedures in the future.  I discussed with the patient the nature of the staged access procedure, specifically the need for a second operation to transpose the first stage fistula if it matures adequately.   The patient has agreed to proceed with the above procedure which will be scheduled at his convenience.  Victorino Sparrow, MD [facility] [phone]

## 2022-12-02 ENCOUNTER — Ambulatory Visit (INDEPENDENT_AMBULATORY_CARE_PROVIDER_SITE_OTHER): Payer: 59 | Admitting: Vascular Surgery

## 2022-12-02 ENCOUNTER — Encounter: Payer: Self-pay | Admitting: Vascular Surgery

## 2022-12-02 VITALS — BP 191/94 | HR 66 | Temp 98.7°F | Resp 18 | Ht 67.0 in | Wt 179.2 lb

## 2022-12-02 DIAGNOSIS — N184 Chronic kidney disease, stage 4 (severe): Secondary | ICD-10-CM | POA: Diagnosis not present

## 2022-12-05 ENCOUNTER — Other Ambulatory Visit: Payer: Self-pay

## 2022-12-05 ENCOUNTER — Ambulatory Visit (INDEPENDENT_AMBULATORY_CARE_PROVIDER_SITE_OTHER): Payer: 59 | Admitting: Student

## 2022-12-05 ENCOUNTER — Other Ambulatory Visit (HOSPITAL_COMMUNITY): Payer: Self-pay | Admitting: *Deleted

## 2022-12-05 VITALS — BP 176/81 | HR 60 | Temp 98.2°F | Resp 19 | Ht 67.0 in | Wt 181.5 lb

## 2022-12-05 DIAGNOSIS — Z1159 Encounter for screening for other viral diseases: Secondary | ICD-10-CM | POA: Diagnosis not present

## 2022-12-05 DIAGNOSIS — Z23 Encounter for immunization: Secondary | ICD-10-CM | POA: Diagnosis not present

## 2022-12-05 DIAGNOSIS — I132 Hypertensive heart and chronic kidney disease with heart failure and with stage 5 chronic kidney disease, or end stage renal disease: Secondary | ICD-10-CM | POA: Diagnosis not present

## 2022-12-05 DIAGNOSIS — Z1211 Encounter for screening for malignant neoplasm of colon: Secondary | ICD-10-CM

## 2022-12-05 DIAGNOSIS — N185 Chronic kidney disease, stage 5: Secondary | ICD-10-CM

## 2022-12-05 DIAGNOSIS — I5032 Chronic diastolic (congestive) heart failure: Secondary | ICD-10-CM | POA: Diagnosis not present

## 2022-12-05 DIAGNOSIS — I10 Essential (primary) hypertension: Secondary | ICD-10-CM

## 2022-12-05 DIAGNOSIS — D649 Anemia, unspecified: Secondary | ICD-10-CM

## 2022-12-05 DIAGNOSIS — N184 Chronic kidney disease, stage 4 (severe): Secondary | ICD-10-CM | POA: Diagnosis not present

## 2022-12-05 NOTE — Patient Instructions (Signed)
  Thank you, Samuel Gardner, for allowing [place] to provide your care today.  You are doing a great job managing your blood pressure and going to all your appointments.  I am glad that we have made some progress I hope that we continue to improve your symptoms going forward.  As always please do not hesitate to call our office if you have any issues.  I will call you and let you know if we are able to get these hepatitis labs below but if we are not I recommend that you tell you your cardiologist to try and get them when they get labs.   I have ordered the following labs for you:   Lab Orders         BMP8+Anion Gap         CBC no Diff         Hepatitis B Surface Antibody         Hepatitis B core Ab, Total         Hepatitis B Surface Antigen       Referrals ordered today:    Referral Orders         Ambulatory referral to Gastroenterology    for colonoscopy    Follow up: 3-4 months    Remember:     Should you have any questions or concerns please call the internal medicine clinic at [phone].     Rocky Morel, DO [facility]

## 2022-12-05 NOTE — Progress Notes (Unsigned)
CC: Routine follow-up  HPI:  Samuel Gardner is a 52 y.o. male with PMH as below who presents to the clinic for a routine follow-up.  Please see assessment and plan for further details.  Past Medical History:  Diagnosis Date   (HFpEF) heart failure with preserved ejection fraction (HCC)    Ascending aorta dilatation (HCC)    40mm by echo 11/2022   CKD (chronic kidney disease), stage IV (HCC)    Diabetes mellitus without complication (HCC)    Hypertension    Mitral regurgitation    mild by echo 11/2022   Normocytic anemia    Review of Systems:   Pertinent items noted in HPI and/or A&P.  Physical Exam:  Vitals:   12/05/22 1607 12/05/22 1630  BP: (!) 185/84 (!) 176/81  Pulse: (!) 59 60  Resp: 19   Temp: 98.2 F (36.8 C)   TempSrc: Oral   SpO2: 99%   Weight: 181 lb 8 oz (82.3 kg)   Height: 5\' 7"  (1.702 m)     Constitutional: Well-appearing adult male. In no acute distress. HEENT: Normocephalic, atraumatic, Sclera non-icteric, PERRL, EOM intact Cardio:Regular rate and rhythm. 2+ bilateral radial pulses. Pulm:Clear to auscultation bilaterally. Normal work of breathing on room air. Abdomen: Soft, non-tender, non-distended, positive bowel sounds. MSK: Trace lower extremity edema bilaterally Skin:Warm and dry. Neuro:Alert and oriented x3. No focal deficit noted. Psych:Pleasant mood and affect.   Assessment & Plan:   (HFpEF) heart failure with preserved ejection fraction (HCC) Patient brings blood pressure and weight log in with him.  Weights have been steady between 172 and 175 pounds predominantly over the past 3 weeks.  And blood pressures have been largely 120s/60s.  He has not had any worsening shortness of breath but does have bothersome baseline shortness of breath that occurs even when walking short distances from the car to the store.  He would like a handicap placard to be able to be closer 1 parking.  Lower extremity edema has overall improved subjectively and  on exam he appears euvolemic.  No changes to medications today. - Continue carvedilol 25 mg twice daily, furosemide 80 mg twice daily, Imdur 90 mg daily, hydralazine 100 mg 3 times daily, amlodipine 5 mg daily, and Kionex 15 g 3 times weekly for hyperkalemia  Essential hypertension Blood pressure here elevated at 176/81 but patient brings with him a blood pressure log for the last 3 weeks which shows predominantly blood pressures of 120/60.  This is also consistent with other lungs and lab work.  Patient likely has a component of whitecoat hypertension as well as essential hypertension that is well-controlled on current medications. - Continue amlodipine 5 mg daily, carvedilol 25 mg twice daily, hydralazine 100 mg 3 times daily, Imdur 90 mg daily, and Lasix 80 mg twice daily  CKD (chronic kidney disease), stage V (HCC) The patient has progressed to CKD stage V and has been referred to vascular surgery for vein mapping and fistula creation.  Met with Dr. Karin Lieu and will schedule for step of fistula creation in the near future.  Also follows closely with Dr. Malen Gauze with nephrology and has been referred to transplant nephrology.  They bring with them some information and a checklist prior to meeting with the transplant nephrologist.  We will check hep B serologies, given the flu and pneumonia shot, and send him for a colonoscopy today. - Continue to follow-up with nephrology, transplant nephrology, and vascular as he prepares for dialysis and/or kidney transplant  Normocytic anemia Recent CBC with a hemoglobin of 9.3.  He is on iron replacement and we will continue this.  Repeat CBC today.    Patient discussed with Dr. Duwayne Heck, DO [facility] Internal [facility] Resident PGY-2 Pager: [phone]

## 2022-12-06 ENCOUNTER — Other Ambulatory Visit: Payer: Self-pay

## 2022-12-06 DIAGNOSIS — N184 Chronic kidney disease, stage 4 (severe): Secondary | ICD-10-CM

## 2022-12-06 LAB — BMP8+ANION GAP
Anion Gap: 16 mmol/L (ref 10.0–18.0)
BUN/Creatinine Ratio: 13 (ref 9–20)
BUN: 56 mg/dL — ABNORMAL HIGH (ref 6–24)
CO2: 21 mmol/L (ref 20–29)
Calcium: 8.6 mg/dL — ABNORMAL LOW (ref 8.7–10.2)
Chloride: 105 mmol/L (ref 96–106)
Creatinine, Ser: 4.45 mg/dL — ABNORMAL HIGH (ref 0.76–1.27)
Glucose: 107 mg/dL — ABNORMAL HIGH (ref 70–99)
Potassium: 4.5 mmol/L (ref 3.5–5.2)
Sodium: 142 mmol/L (ref 134–144)
eGFR: 15 mL/min/{1.73_m2} — ABNORMAL LOW (ref >59)

## 2022-12-06 LAB — CBC
Hematocrit: 31.7 % — ABNORMAL LOW (ref 37.5–51.0)
Hemoglobin: 10.2 g/dL — ABNORMAL LOW (ref 13.0–17.7)
MCH: 27.3 pg (ref 26.6–33.0)
MCHC: 32.2 g/dL (ref 31.5–35.7)
MCV: 85 fL (ref 79–97)
Platelets: 301 10*3/uL (ref 150–450)
RBC: 3.74 x10E6/uL — ABNORMAL LOW (ref 4.14–5.80)
RDW: 13.9 % (ref 11.6–15.4)
WBC: 7.4 10*3/uL (ref 3.4–10.8)

## 2022-12-06 LAB — HEPATITIS B CORE ANTIBODY, TOTAL: Hep B Core Total Ab: NEGATIVE

## 2022-12-06 LAB — HEPATITIS B SURFACE ANTIGEN: Hepatitis B Surface Ag: NEGATIVE

## 2022-12-06 LAB — HEPATITIS B SURFACE ANTIBODY,QUALITATIVE: Hep B Surface Ab, Qual: NONREACTIVE

## 2022-12-06 NOTE — Assessment & Plan Note (Signed)
The patient has progressed to CKD stage V and has been referred to vascular surgery for vein mapping and fistula creation.  Met with Dr. Karin Lieu and will schedule for step of fistula creation in the near future.  Also follows closely with Dr. Malen Gauze with nephrology and has been referred to transplant nephrology.  They bring with them some information and a checklist prior to meeting with the transplant nephrologist.  We will check hep B serologies, given the flu and pneumonia shot, and send him for a colonoscopy today. - Continue to follow-up with nephrology, transplant nephrology, and vascular as he prepares for dialysis and/or kidney transplant

## 2022-12-06 NOTE — Assessment & Plan Note (Signed)
Patient brings blood pressure and weight log in with him.  Weights have been steady between 172 and 175 pounds predominantly over the past 3 weeks.  And blood pressures have been largely 120s/60s.  He has not had any worsening shortness of breath but does have bothersome baseline shortness of breath that occurs even when walking short distances from the car to the store.  He would like a handicap placard to be able to be closer 1 parking.  Lower extremity edema has overall improved subjectively and on exam he appears euvolemic.  No changes to medications today. - Continue carvedilol 25 mg twice daily, furosemide 80 mg twice daily, Imdur 90 mg daily, hydralazine 100 mg 3 times daily, amlodipine 5 mg daily, and Kionex 15 g 3 times weekly for hyperkalemia

## 2022-12-06 NOTE — Assessment & Plan Note (Signed)
Blood pressure here elevated at 176/81 but patient brings with him a blood pressure log for the last 3 weeks which shows predominantly blood pressures of 120/60.  This is also consistent with other lungs and lab work.  Patient likely has a component of whitecoat hypertension as well as essential hypertension that is well-controlled on current medications. - Continue amlodipine 5 mg daily, carvedilol 25 mg twice daily, hydralazine 100 mg 3 times daily, Imdur 90 mg daily, and Lasix 80 mg twice daily

## 2022-12-06 NOTE — Assessment & Plan Note (Signed)
Recent CBC with a hemoglobin of 9.3.  He is on iron replacement and we will continue this.  Repeat CBC today.

## 2022-12-07 ENCOUNTER — Ambulatory Visit (HOSPITAL_COMMUNITY)
Admission: RE | Admit: 2022-12-07 | Discharge: 2022-12-07 | Disposition: A | Discharge status: Home / Self Care | Payer: 59 | Source: Ambulatory Visit | Attending: Nephrology | Admitting: Nephrology

## 2022-12-07 DIAGNOSIS — N189 Chronic kidney disease, unspecified: Secondary | ICD-10-CM | POA: Insufficient documentation

## 2022-12-07 DIAGNOSIS — D631 Anemia in chronic kidney disease: Secondary | ICD-10-CM | POA: Insufficient documentation

## 2022-12-07 MED ORDER — SODIUM CHLORIDE 0.9 % IV SOLN
510.0000 mg | INTRAVENOUS | Status: DC
  Administered 2022-12-07: 510 mg via INTRAVENOUS
  Filled 2022-12-07: qty 510

## 2022-12-08 NOTE — Progress Notes (Signed)
Spoke to the patient and his spouse about stable CBC and BMP.  He is hep B nonimmune and is planning to get the COVID-vaccine and shingles vaccine at the pharmacy so he will get the hepatitis B vaccine series there as well.

## 2022-12-09 ENCOUNTER — Encounter (HOSPITAL_COMMUNITY): Payer: Self-pay | Admitting: Vascular Surgery

## 2022-12-09 NOTE — Progress Notes (Signed)
SDW call  Patient was given pre-op instructions over the phone. Patient verbalized understanding of instructions provided.     PCP -  Dr. Rocky Morel Cardiologist - Dr. Armanda Magic Pulmonary:    PPM/ICD - denies Device Orders - n/a Rep Notified - n/a   Chest x-ray - 11/10/2022 EKG -  11/11/2022 Stress Test -  ECHO - 11/17/2022 Cardiac Cath -   Sleep Study/sleep apnea/CPAP: denies  Type II diabetic. Diet controlled Fasting Blood sugar range: Does not check sugars How often check sugars: Does not check sugars   Blood Thinner Instructions: denies Aspirin Instructions:denies   ERAS Protcol - NPO   COVID TEST- n/a    Anesthesia review: Yes. HTN, DM, CKD, mitrral regurgitation, AAA   Patient denies shortness of breath, fever, cough and chest pain over the phone call  Your procedure is scheduled on Tuesday December 13, 2022  Report to [facility] Main Entrance "A" at 0530 A.M., then check in with the Admitting office.  Call this number if you have problems the morning of surgery:  [phone]   If you have any questions prior to your surgery date call [phone]: Open Monday-Friday 8am-4pm If you experience any cold or flu symptoms such as cough, fever, chills, shortness of breath, etc. between now and your scheduled surgery, please notify us at the above number    Remember:  Do not eat or drink  after midnight the night before your surgery  Take these medicines the morning of surgery with A SIP OF WATER:  Amlodipine, atorvastatin, carvedilol, hydralazine, imdur  As needed: albuterol  As of today, STOP taking any Aspirin (unless otherwise instructed by your surgeon) Aleve, Naproxen, Ibuprofen, Motrin, Advil, Goody's, BC's, all herbal medications, fish oil, and all vitamins.

## 2022-12-09 NOTE — Progress Notes (Signed)
Anesthesia Chart Review: Samuel Gardner  Case: [id number] Date/Time: 12/13/22 0715   Procedure: LEFT ARM ARTERIOVENOUS (AV) FISTULA CREATION (Left)   Anesthesia type: Monitor Anesthesia Care   Pre-op diagnosis: Chronic Kidney Disease Stage 5   Location: [facility] ROOM 16 / [facility]   Surgeons: Victorino Sparrow, MD       DISCUSSION: Patient is a 52 year old male scheduled for the above procedure.  History includes former smoker (01/30/22), HTN, DM2, HFpEF, mitral regurgitation, ascending aorta dilatation, CKD, anemia.   Last cardiology visit with Dr. Mayford Knife was on 11/09/22. He noted history of mildly elevated troponin in setting of hypertensive urgency in 01/2022, EF 55-60%, grade 1 DD.  Ischemic workup was not pursued inpatient since his chest pain was thought to be due to demand ischemia in setting of significant hypertension.  Imdur was increased and amlodipine pain decreased due to lower extremity edema.  He was referred to nephrology and was being monitored for progression of CKD. He underwent a stress PET CT on 05/17/22 which demonstrated no ischemia but reduced global blood flow reserve concerning for possible severe multivessel CAD causing balanced ischemia versus microvascular disease.  There was no evidence of TID or drop of EF with stress, so likely prevascular disease was suspected.  He only had mild coronary artery calcifications on CT at that time, and medical management was recommended. At 11/09/22 visit, he reported increased dyspnea, with orthopnea and edema. She ordered a BNP was was nearly 6000 and BMP with further deterioration of his renal function with eGFR to 16 and Creatinine > 4. He was advised to go to ED for admission by [facility] and nephrology.   [place] admission 11/10/22-11/12/22 for AKI on CKD with acute on chronic diastolic CHF as above. BP 222/104. CXR showed mild pulmonary congestion. Renal US showed normal appears kidneys. 10/15/22 echo showed LVEF 50-55% and Grade I LV diastolic  dysfunction consistent with his previous echo in 01/2022. AKI felt likely due to cardiorenal syndrome from fluid overload and congestion resulting in a prerenal AKI. Lasix was given which improved his symptoms. Nephrology was consulted and recommended to discharge with increased dose of Lasix to 80mg  BID. Echo repeated on 11/17/22 per cardiology and showed normal EF 60-65%, no regional wall motion abnormalities, moderate LVH, grade 1 diastolic dysfunction, normal RV systolic function, moderately dilated LA, mild MR, mildly calcified AV with no AS and mildly dilated ascending aorta at 40 mm.  Nephrologist Dr. Malen Gauze referred him to [facility] for dialysis access. It does not appear that he is yet on hemodialysis. Anesthesia team to evaluate on the day of surgery.   VS:  BP Readings from Last 3 Encounters:  12/07/22 (!) 165/85  12/05/22 (!) 176/81  12/02/22 (!) 191/94   Pulse Readings from Last 3 Encounters:  12/07/22 (!) 58  12/05/22 60  12/02/22 66     PROVIDERS: Rocky Morel, DO is PCP Armanda Magic, MD is cardiologist Vallery Sa, MD is his nephrologist   LABS: For day of surgery. Last results in [facility] include: Lab Results  Component Value Date   WBC 7.4 12/05/2022   HGB 10.2 (L) 12/05/2022   HCT 31.7 (L) 12/05/2022   PLT 301 12/05/2022   GLUCOSE 107 (H) 12/05/2022   CHOL 208 (H) 01/30/2022   TRIG 106 01/30/2022   HDL 55 01/30/2022   LDLCALC 132 (H) 01/30/2022   ALT 16 11/10/2022   AST 17 11/10/2022   NA 142 12/05/2022   K 4.5 12/05/2022  CL 105 12/05/2022   CREATININE 4.45 (H) 12/05/2022   BUN 56 (H) 12/05/2022   CO2 21 12/05/2022   HGBA1C 6.1 (H) 11/11/2022     IMAGES: US Renal 11/11/22: IMPRESSION: 1. Normal appearance of both kidneys without hydronephrosis. 2. Small amount of right perinephric fluid.   CXR 11/10/22: IMPRESSION: Mild cardiomegaly and mild pulmonary vascular congestion without evidence of acute or active cardiopulmonary disease.    EKG: 11/10/22:   Normal sinus rhythm Nonspecific ST abnormality Abnormal ECG When compared with ECG of 24-Jun-2022 20:36, PREVIOUS ECG IS PRESENT Confirmed by Ross Marcus ([id number]) on 11/11/2022 12:41:14 AM   CV: Echo 11/17/22: IMPRESSIONS   1. Left ventricular ejection fraction, by estimation, is 60 to 65%. The  left ventricle has normal function. The left ventricle has no regional  wall motion abnormalities. There is moderate left ventricular hypertrophy.  Left ventricular diastolic  parameters are consistent with Grade I diastolic dysfunction (impaired  relaxation).   2. Right ventricular systolic function is normal. The right ventricular  size is normal.   3. Left atrial size was moderately dilated.   4. Right atrial size was mildly dilated.   5. The mitral valve is degenerative. Mild mitral valve regurgitation. No  evidence of mitral stenosis.   6. The aortic valve is tricuspid. There is mild calcification of the  aortic valve. Aortic valve regurgitation is not visualized. Aortic valve  sclerosis/calcification is present, without any evidence of aortic  stenosis.   7. Aortic dilatation noted. There is mild dilatation of the ascending  aorta, measuring 40 mm.   8. The inferior vena cava is normal in size with greater than 50%  respiratory variability, suggesting right atrial pressure of 3 mmHg.    NM PET CT Cardiac 05/17/22:   Normal perfusion but myocardial blood flow reserve is reduced both globally and in each coronary distribution.  Differential includes severe multivessel CAD causing balanced ischemia vs. microvascular disease.  Considering there is no other evidence of severe multivessel CAD (no drop in EF with stress, no TID) and only mild coronary calcifications on CT, suspect more likely microvascular disease.   LV perfusion is normal. There is no evidence of ischemia. There is no evidence of infarction.   Rest left ventricular function is abnormal. Rest global function is mildly  reduced. Rest EF: 42 %. Stress left ventricular function is abnormal. Stress global function is mildly reduced. Stress EF: 47 %. End diastolic cavity size is moderately enlarged. End systolic cavity size is moderately enlarged.   Myocardial blood flow was computed to be 0.56ml/g/min at rest and 1.81ml/g/min at stress. Global myocardial blood flow reserve was 1.60 and was abnormal.   Coronary calcium was present on the attenuation correction CT images. Mild coronary calcifications were present. Coronary calcifications were present in the left anterior descending artery and left circumflex artery distribution(s).   Findings are consistent with no ischemia and no infarction. The study is intermediate risk due to decreased myocardial blood flow reserve   Past Medical History:  Diagnosis Date   (HFpEF) heart failure with preserved ejection fraction (HCC)    Ascending aorta dilatation (HCC)    40mm by echo 11/2022   CKD (chronic kidney disease), stage IV (HCC)    Diabetes mellitus without complication (HCC)    Hypertension    Mitral regurgitation    mild by echo 11/2022   Normocytic anemia     Past Surgical History:  Procedure Laterality Date   LEG SURGERY Right    teenager  MEDICATIONS: No current facility-administered medications for this encounter.    albuterol (VENTOLIN HFA) 108 (90 Base) MCG/ACT inhaler   amLODipine (NORVASC) 5 MG tablet   atorvastatin (LIPITOR) 40 MG tablet   carvedilol (COREG) 25 MG tablet   cholecalciferol (VITAMIN D3) 25 MCG (1000 UNIT) tablet   ferrous sulfate 325 (65 FE) MG EC tablet   furosemide (LASIX) 80 MG tablet   hydrALAZINE (APRESOLINE) 100 MG tablet   isosorbide mononitrate (IMDUR) 30 MG 24 hr tablet   KIONEX 15 GM/60ML suspension   nitroGLYCERIN (NITROSTAT) 0.4 MG SL tablet   polyethylene glycol powder (GAVILAX) 17 GM/SCOOP powder    Shonna Chock, [name] Surgical Short Stay/Anesthesiology [facility] Phone [phone] [facility] Phone [phone] 12/09/2022 12:11 PM

## 2022-12-09 NOTE — Anesthesia Preprocedure Evaluation (Signed)
Anesthesia Evaluation  Patient identified by MRN, date of birth, ID band Patient awake    Reviewed: Allergy & Precautions, NPO status , Patient's Chart, lab work & pertinent test results, reviewed documented beta blocker date and time   Airway Mallampati: II  TM Distance: >3 FB Neck ROM: Full    Dental  (+) Teeth Intact, Dental Advisory Given   Pulmonary former smoker   breath sounds clear to auscultation       Cardiovascular hypertension, Pt. on medications and Pt. on home beta blockers + CAD and +CHF   Rhythm:Regular Rate:Normal     Neuro/Psych negative neurological ROS  negative psych ROS   GI/Hepatic negative GI ROS, Neg liver ROS,,,  Endo/Other  diabetes    Renal/GU ESRFRenal disease     Musculoskeletal   Abdominal   Peds  Hematology  (+) Blood dyscrasia, anemia   Anesthesia Other Findings   Reproductive/Obstetrics                             Anesthesia Physical Anesthesia Plan  ASA: 3  Anesthesia Plan: Regional   Post-op Pain Management: Tylenol PO (pre-op)*   Induction: Intravenous  PONV Risk Score and Plan: 0 and Propofol infusion and Ondansetron  Airway Management Planned: Natural Airway and Nasal Cannula  Additional Equipment: None  Intra-op Plan:   Post-operative Plan:   Informed Consent: I have reviewed the patients History and Physical, chart, labs and discussed the procedure including the risks, benefits and alternatives for the proposed anesthesia with the patient or authorized representative who has indicated his/her understanding and acceptance.       Plan Discussed with: CRNA  Anesthesia Plan Comments: (PAT note written 12/09/2022 by Shonna Chock, [name].  )       Anesthesia Quick Evaluation

## 2022-12-12 NOTE — Progress Notes (Signed)
Internal Medicine Clinic Attending  Case discussed with the resident at the time of the visit.  We reviewed the resident's history and exam and pertinent patient test results.  I agree with the assessment, diagnosis, and plan of care documented in the resident's note.  

## 2022-12-13 ENCOUNTER — Ambulatory Visit (HOSPITAL_COMMUNITY)
Admission: RE | Admit: 2022-12-13 | Discharge: 2022-12-13 | Disposition: A | Discharge status: Home / Self Care | Payer: 59 | Attending: Vascular Surgery | Admitting: Vascular Surgery

## 2022-12-13 ENCOUNTER — Encounter (HOSPITAL_COMMUNITY)
Admission: RE | Disposition: A | Discharge status: Home / Self Care | Payer: Self-pay | Source: Home / Self Care | Attending: Vascular Surgery

## 2022-12-13 ENCOUNTER — Other Ambulatory Visit: Payer: Self-pay

## 2022-12-13 ENCOUNTER — Encounter (HOSPITAL_COMMUNITY): Payer: Self-pay | Admitting: Vascular Surgery

## 2022-12-13 ENCOUNTER — Telehealth: Payer: Self-pay | Admitting: Vascular Surgery

## 2022-12-13 ENCOUNTER — Ambulatory Visit (HOSPITAL_COMMUNITY): Payer: 59 | Admitting: Vascular Surgery

## 2022-12-13 ENCOUNTER — Ambulatory Visit (HOSPITAL_BASED_OUTPATIENT_CLINIC_OR_DEPARTMENT_OTHER): Payer: Self-pay | Admitting: Vascular Surgery

## 2022-12-13 DIAGNOSIS — E1122 Type 2 diabetes mellitus with diabetic chronic kidney disease: Secondary | ICD-10-CM | POA: Diagnosis not present

## 2022-12-13 DIAGNOSIS — I120 Hypertensive chronic kidney disease with stage 5 chronic kidney disease or end stage renal disease: Secondary | ICD-10-CM | POA: Diagnosis not present

## 2022-12-13 DIAGNOSIS — Z8249 Family history of ischemic heart disease and other diseases of the circulatory system: Secondary | ICD-10-CM | POA: Insufficient documentation

## 2022-12-13 DIAGNOSIS — I5032 Chronic diastolic (congestive) heart failure: Secondary | ICD-10-CM | POA: Insufficient documentation

## 2022-12-13 DIAGNOSIS — Z833 Family history of diabetes mellitus: Secondary | ICD-10-CM | POA: Diagnosis not present

## 2022-12-13 DIAGNOSIS — I5030 Unspecified diastolic (congestive) heart failure: Secondary | ICD-10-CM | POA: Diagnosis not present

## 2022-12-13 DIAGNOSIS — N185 Chronic kidney disease, stage 5: Secondary | ICD-10-CM

## 2022-12-13 DIAGNOSIS — I132 Hypertensive heart and chronic kidney disease with heart failure and with stage 5 chronic kidney disease, or end stage renal disease: Secondary | ICD-10-CM | POA: Insufficient documentation

## 2022-12-13 DIAGNOSIS — N186 End stage renal disease: Secondary | ICD-10-CM

## 2022-12-13 DIAGNOSIS — Z87891 Personal history of nicotine dependence: Secondary | ICD-10-CM | POA: Diagnosis not present

## 2022-12-13 DIAGNOSIS — D631 Anemia in chronic kidney disease: Secondary | ICD-10-CM | POA: Insufficient documentation

## 2022-12-13 DIAGNOSIS — I2510 Atherosclerotic heart disease of native coronary artery without angina pectoris: Secondary | ICD-10-CM | POA: Diagnosis not present

## 2022-12-13 DIAGNOSIS — N184 Chronic kidney disease, stage 4 (severe): Secondary | ICD-10-CM

## 2022-12-13 HISTORY — PX: AV FISTULA PLACEMENT: SHX1204

## 2022-12-13 LAB — GLUCOSE, CAPILLARY
Glucose-Capillary: 104 mg/dL — ABNORMAL HIGH (ref 70–99)
Glucose-Capillary: 149 mg/dL — ABNORMAL HIGH (ref 70–99)
Glucose-Capillary: 136 mg/dL — ABNORMAL HIGH (ref 70–99)

## 2022-12-13 LAB — POCT I-STAT, CHEM 8
BUN: 53 mg/dL — ABNORMAL HIGH (ref 6–20)
Calcium, Ion: 1.01 mmol/L — ABNORMAL LOW (ref 1.15–1.40)
Chloride: 112 mmol/L — ABNORMAL HIGH (ref 98–111)
Creatinine, Ser: 5.5 mg/dL — ABNORMAL HIGH (ref 0.61–1.24)
Glucose, Bld: 110 mg/dL — ABNORMAL HIGH (ref 70–99)
HCT: 27 % — ABNORMAL LOW (ref 39.0–52.0)
Hemoglobin: 9.2 g/dL — ABNORMAL LOW (ref 13.0–17.0)
Potassium: 3.7 mmol/L (ref 3.5–5.1)
Sodium: 143 mmol/L (ref 135–145)
TCO2: 18 mmol/L — ABNORMAL LOW (ref 22–32)

## 2022-12-13 SURGERY — ARTERIOVENOUS (AV) FISTULA CREATION
Anesthesia: Regional | Site: Arm Upper | Laterality: Left

## 2022-12-13 MED ORDER — 0.9 % SODIUM CHLORIDE (POUR BTL) OPTIME
TOPICAL | Status: DC | PRN
  Administered 2022-12-13: 1000 mL

## 2022-12-13 MED ORDER — ACETAMINOPHEN 10 MG/ML IV SOLN: 1000.0000 mg | Freq: Once | INTRAVENOUS | Status: DC | PRN

## 2022-12-13 MED ORDER — ONDANSETRON HCL 4 MG/2ML IJ SOLN
INTRAMUSCULAR | Status: DC | PRN
  Administered 2022-12-13: 4 mg via INTRAVENOUS

## 2022-12-13 MED ORDER — FENTANYL CITRATE (PF) 100 MCG/2ML IJ SOLN: 25.0000 ug | INTRAMUSCULAR | Status: DC | PRN

## 2022-12-13 MED ORDER — PROPOFOL 1000 MG/100ML IV EMUL
INTRAVENOUS | Status: AC
  Filled 2022-12-13: qty 100

## 2022-12-13 MED ORDER — MIDAZOLAM HCL 2 MG/2ML IJ SOLN
INTRAMUSCULAR | Status: AC
  Filled 2022-12-13: qty 2

## 2022-12-13 MED ORDER — OXYCODONE HCL 5 MG/5ML PO SOLN: 5.0000 mg | Freq: Once | ORAL | Status: DC | PRN

## 2022-12-13 MED ORDER — LIDOCAINE 2% (20 MG/ML) 5 ML SYRINGE
INTRAMUSCULAR | Status: AC
  Filled 2022-12-13: qty 5

## 2022-12-13 MED ORDER — PHENYLEPHRINE HCL-NACL 20-0.9 MG/250ML-% IV SOLN
INTRAVENOUS | Status: DC | PRN
  Administered 2022-12-13: 25 ug/min via INTRAVENOUS

## 2022-12-13 MED ORDER — ONDANSETRON HCL 4 MG/2ML IJ SOLN
INTRAMUSCULAR | Status: AC
  Filled 2022-12-13: qty 2

## 2022-12-13 MED ORDER — FENTANYL CITRATE (PF) 100 MCG/2ML IJ SOLN
INTRAMUSCULAR | Status: DC | PRN
  Administered 2022-12-13 (×2): 50 ug via INTRAVENOUS

## 2022-12-13 MED ORDER — FENTANYL CITRATE (PF) 100 MCG/2ML IJ SOLN
INTRAMUSCULAR | Status: AC
  Filled 2022-12-13: qty 2

## 2022-12-13 MED ORDER — OXYCODONE HCL 5 MG PO TABS: 5.0000 mg | ORAL_TABLET | Freq: Once | ORAL | Status: DC | PRN

## 2022-12-13 MED ORDER — PROPOFOL 10 MG/ML IV BOLUS
INTRAVENOUS | Status: AC
  Filled 2022-12-13: qty 20

## 2022-12-13 MED ORDER — PHENYLEPHRINE 80 MCG/ML (10ML) SYRINGE FOR IV PUSH (FOR BLOOD PRESSURE SUPPORT)
PREFILLED_SYRINGE | INTRAVENOUS | Status: AC
  Filled 2022-12-13: qty 10

## 2022-12-13 MED ORDER — ACETAMINOPHEN 160 MG/5ML PO SOLN: 325.0000 mg | ORAL | Status: DC | PRN

## 2022-12-13 MED ORDER — ACETAMINOPHEN 325 MG PO TABS: 325.0000 mg | ORAL_TABLET | ORAL | Status: DC | PRN

## 2022-12-13 MED ORDER — CHLORHEXIDINE GLUCONATE 4 % EX SOLN: 60.0000 mL | Freq: Once | CUTANEOUS | Status: DC

## 2022-12-13 MED ORDER — CEFAZOLIN SODIUM-DEXTROSE 2-4 GM/100ML-% IV SOLN
2.0000 g | INTRAVENOUS | Status: AC
  Administered 2022-12-13: 2 g via INTRAVENOUS
  Filled 2022-12-13: qty 100

## 2022-12-13 MED ORDER — HEPARIN 6000 UNIT IRRIGATION SOLUTION
Status: AC
  Filled 2022-12-13: qty 500

## 2022-12-13 MED ORDER — CHLORHEXIDINE GLUCONATE 0.12 % MT SOLN: 15.0000 mL | Freq: Once | OROMUCOSAL | Status: AC

## 2022-12-13 MED ORDER — OXYCODONE-ACETAMINOPHEN 5-325 MG PO TABS: 1.0000 | ORAL_TABLET | Freq: Four times a day (QID) | ORAL | 0 refills | Status: DC | PRN

## 2022-12-13 MED ORDER — INSULIN ASPART 100 UNIT/ML IJ SOLN: 0.0000 [IU] | INTRAMUSCULAR | Status: DC | PRN

## 2022-12-13 MED ORDER — CHLORHEXIDINE GLUCONATE 0.12 % MT SOLN
OROMUCOSAL | Status: AC
  Administered 2022-12-13: 15 mL via OROMUCOSAL
  Filled 2022-12-13: qty 15

## 2022-12-13 MED ORDER — PROPOFOL 500 MG/50ML IV EMUL
INTRAVENOUS | Status: DC | PRN
  Administered 2022-12-13: 50 ug/kg/min via INTRAVENOUS

## 2022-12-13 MED ORDER — HEPARIN SODIUM (PORCINE) 1000 UNIT/ML IJ SOLN
INTRAMUSCULAR | Status: DC | PRN
  Administered 2022-12-13: 2000 [IU] via INTRAVENOUS

## 2022-12-13 MED ORDER — ORAL CARE MOUTH RINSE: 15.0000 mL | Freq: Once | OROMUCOSAL | Status: AC

## 2022-12-13 MED ORDER — LIDOCAINE-EPINEPHRINE (PF) 1.5 %-1:200000 IJ SOLN
INTRAMUSCULAR | Status: DC | PRN
Start: 2022-12-13 — End: 2022-12-13
  Administered 2022-12-13: 25 mL via PERINEURAL

## 2022-12-13 MED ORDER — EPHEDRINE 5 MG/ML INJ
INTRAVENOUS | Status: AC
  Filled 2022-12-13: qty 5

## 2022-12-13 MED ORDER — HEPARIN 6000 UNIT IRRIGATION SOLUTION
Status: DC | PRN
  Administered 2022-12-13: 1

## 2022-12-13 MED ORDER — DROPERIDOL 2.5 MG/ML IJ SOLN: 0.6250 mg | Freq: Once | INTRAMUSCULAR | Status: DC | PRN

## 2022-12-13 MED ORDER — SODIUM CHLORIDE 0.9 % IV SOLN: INTRAVENOUS | Status: DC

## 2022-12-13 MED ORDER — LIDOCAINE 2% (20 MG/ML) 5 ML SYRINGE
INTRAMUSCULAR | Status: DC | PRN
  Administered 2022-12-13: 40 mg via INTRAVENOUS

## 2022-12-13 SURGICAL SUPPLY — 35 items
ADH SKN CLS APL DERMABOND .7 (GAUZE/BANDAGES/DRESSINGS) ×1
ARMBAND PINK RESTRICT EXTREMIT (MISCELLANEOUS) ×1 IMPLANT
BAG COUNTER SPONGE SURGICOUNT (BAG) ×1 IMPLANT
BAG SPNG CNTER NS LX DISP (BAG) ×1
BLADE CLIPPER SURG (BLADE) ×1 IMPLANT
BNDG ELASTIC 4X5.8 VLCR STR LF (GAUZE/BANDAGES/DRESSINGS) ×1 IMPLANT
CANISTER SUCT 3000ML PPV (MISCELLANEOUS) ×1 IMPLANT
CLIP TI MEDIUM 6 (CLIP) ×2 IMPLANT
CLIP TI WIDE RED SMALL 6 (CLIP) ×1 IMPLANT
COVER PROBE W GEL 5X96 (DRAPES) ×1 IMPLANT
DERMABOND ADVANCED .7 DNX12 (GAUZE/BANDAGES/DRESSINGS) ×1 IMPLANT
ELECT REM PT RETURN 9FT ADLT (ELECTROSURGICAL) ×1
ELECTRODE REM PT RTRN 9FT ADLT (ELECTROSURGICAL) ×1 IMPLANT
GLOVE BIOGEL PI IND STRL 8 (GLOVE) ×1 IMPLANT
GOWN STRL REUS W/ TWL LRG LVL3 (GOWN DISPOSABLE) ×2 IMPLANT
GOWN STRL REUS W/TWL 2XL LVL3 (GOWN DISPOSABLE) ×2 IMPLANT
GOWN STRL REUS W/TWL LRG LVL3 (GOWN DISPOSABLE) ×2
KIT BASIN OR (CUSTOM PROCEDURE TRAY) ×1 IMPLANT
KIT TURNOVER KIT B (KITS) ×1 IMPLANT
NS IRRIG 1000ML POUR BTL (IV SOLUTION) ×1 IMPLANT
PACK CV ACCESS (CUSTOM PROCEDURE TRAY) ×1 IMPLANT
PAD ARMBOARD 7.5X6 YLW CONV (MISCELLANEOUS) ×2 IMPLANT
SLING ARM FOAM STRAP LRG (SOFTGOODS) IMPLANT
SLING ARM FOAM STRAP MED (SOFTGOODS) IMPLANT
SPIKE FLUID TRANSFER (MISCELLANEOUS) ×1 IMPLANT
SUT MNCRL AB 4-0 PS2 18 (SUTURE) ×1 IMPLANT
SUT PROLENE 6 0 BV (SUTURE) ×1 IMPLANT
SUT PROLENE 7 0 BV 1 (SUTURE) IMPLANT
SUT SILK 2 0 SH (SUTURE) IMPLANT
SUT SILK 3 0 SH CR/8 (SUTURE) ×1 IMPLANT
SUT VIC AB 3-0 SH 27 (SUTURE) ×1
SUT VIC AB 3-0 SH 27X BRD (SUTURE) ×1 IMPLANT
TOWEL GREEN STERILE (TOWEL DISPOSABLE) ×1 IMPLANT
UNDERPAD 30X36 HEAVY ABSORB (UNDERPADS AND DIAPERS) ×1 IMPLANT
WATER STERILE IRR 1000ML POUR (IV SOLUTION) ×1 IMPLANT

## 2022-12-13 NOTE — Telephone Encounter (Signed)
Pt post op scheduled

## 2022-12-13 NOTE — Discharge Instructions (Signed)
[facility]  Discharge Instructions  AV Fistula or Graft Surgery for Dialysis Access  Please refer to the following instructions for your post-procedure care. Your surgeon or physician assistant will discuss any changes with you.  Activity  You may drive the day following your surgery, if you are comfortable and no longer taking prescription pain medication. Resume full activity as the soreness in your incision resolves.  Bathing/Showering  You may shower after you go home. Keep your incision dry for 48 hours. Do not soak in a bathtub, hot tub, or swim until the incision heals completely. You may not shower if you have a hemodialysis catheter.  Incision Care  Clean your incision with mild soap and water after 48 hours. Pat the area dry with a clean towel. You do not need a bandage unless otherwise instructed. Do not apply any ointments or creams to your incision. You may have skin glue on your incision. Do not peel it off. It will come off on its own in about one week. Your arm may swell a bit after surgery. To reduce swelling use pillows to elevate your arm so it is above your heart. Your doctor will tell you if you need to lightly wrap your arm with an ACE bandage.  Diet  Resume your normal diet. There are not special food restrictions following this procedure. In order to heal from your surgery, it is CRITICAL to get adequate nutrition. Your body requires vitamins, minerals, and protein. Vegetables are the best source of vitamins and minerals. Vegetables also provide the perfect balance of protein. Processed food has little nutritional value, so try to avoid this.  Medications  Resume taking all of your medications. If your incision is causing pain, you may take over-the counter pain relievers such as acetaminophen (Tylenol). If you were prescribed a stronger pain medication, please be aware these medications can cause nausea and constipation. Prevent  nausea by taking the medication with a snack or meal. Avoid constipation by drinking plenty of fluids and eating foods with high amount of fiber, such as fruits, vegetables, and grains.  Do not take Tylenol if you are taking prescription pain medications.  Follow up Your surgeon may want to see you in the office following your access surgery. If so, this will be arranged at the time of your surgery.  Please call us immediately for any of the following conditions:  Increased pain, redness, drainage (pus) from your incision site Fever of 101 degrees or higher Severe or worsening pain at your incision site Hand pain or numbness.  Reduce your risk of vascular disease:  Stop smoking. If you would like help, call [facility] at 1-800-QUIT-NOW ((904)212-5464) or  at 7145341812  Manage your cholesterol Maintain a desired weight Control your diabetes Keep your blood pressure down  Dialysis  It will take several weeks to several months for your new dialysis access to be ready for use. Your surgeon will determine when it is okay to use it. Your nephrologist will continue to direct your dialysis. You can continue to use your Permcath until your new access is ready for use.   12/13/2022 [name] [id number] 05-19-1970  Surgeon(s): Victorino Sparrow, MD  Procedure(s): LEFT ARM BRACHIOCEPHALIC ARTERIOVENOUS (AV) FISTULA CREATION   May stick graft immediately   May stick graft on designated area only:   X Do not stick left AV fistula for 12 weeks    If you have any questions, please call the  office at [phone].

## 2022-12-13 NOTE — Transfer of Care (Signed)
Immediate Anesthesia Transfer of Care Note  Patient: Samuel Gardner  Procedure(s) Performed: LEFT ARM BRACHIOCEPHALIC ARTERIOVENOUS (AV) FISTULA CREATION (Left: Arm Upper)  Patient Location: PACU  Anesthesia Type:MAC and Regional  Level of Consciousness: awake, alert , and oriented  Airway & Oxygen Therapy: Patient Spontanous Breathing and Patient connected to nasal cannula oxygen  Post-op Assessment: Report given to RN and Post -op Vital signs reviewed and stable  Post vital signs: Reviewed and stable  Last Vitals:  Vitals Value Taken Time  BP 120/72 12/13/22 0900  Temp 36.4 C 12/13/22 0900  Pulse 60 12/13/22 0901  Resp 16 12/13/22 0901  SpO2 96 % 12/13/22 0901  Vitals shown include unfiled device data.  Last Pain:  Vitals:   12/13/22 0654  TempSrc:   PainSc: 0-No pain         Complications: No notable events documented.

## 2022-12-13 NOTE — H&P (Signed)
Office Note     Patient seen and examined in preop holding.  No complaints. No changes to medication history or physical exam since last seen in clinic. After discussing the risks and benefits of left arm fistula creation, Samuel Gardner elected to proceed.   Victorino Sparrow MD   CC:  CKD5 Requesting Provider:  [name]  HPI: Samuel Gardner is a Right handed 52 y.o. (05-30-1970) male with kidney disease who presents at the request of [name] for permanent HD access.    On exam, Samuel Gardner was doing well, coming by his wife.  They met here in [place], and have been married for 8 years.  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  He was subsequently diagnosed with stage IV kidney disease not seen after an ED visit with a blood pressure greater than 210.  He is currently being managed by Dr. Vallery Sa, who recommended vascular surgery referral for long-term HD access. Samuel Gardner denies history of upper extremity surgery.  Denies numbness tingling in the hands.  He and his wife have been diligent in his chronic kidney disease path to dialysis, going to classes, and even setting up appointments for transplant education at [facility].   Past Medical History:  Diagnosis Date   (HFpEF) heart failure with preserved ejection fraction (HCC)    Ascending aorta dilatation (HCC)    40mm by echo 11/2022   CKD (chronic kidney disease), stage IV (HCC)    Diabetes mellitus without complication (HCC)    Hypertension    Mitral regurgitation    mild by echo 11/2022   Normocytic anemia     Past Surgical History:  Procedure Laterality Date   LEG SURGERY Right    teenager    Social History   Socioeconomic History   Marital status: Married    Spouse name: Not on file   Number of children: Not on file   Years of education: Not on file   Highest education level: Not on file  Occupational History   Not on file  Tobacco Use   Smoking  status: Former    Current packs/day: 0.00    Types: Cigarettes    Quit date: 01/30/2022    Years since quitting: 0.8   Smokeless tobacco: Never  Vaping Use   Vaping status: Never Used  Substance and Sexual Activity   Alcohol use: Not Currently   Drug use: No   Sexual activity: Not on file  Other Topics Concern   Not on file  Social History Narrative   Not on file   Social Determinants of Health   Financial Resource Strain: Not on file  Food Insecurity: No Food Insecurity (12/05/2022)   Hunger Vital Sign    Worried About Running Out of Food in the Last Year: Never true    Ran Out of Food in the Last Year: Never true  Transportation Needs: No Transportation Needs (12/05/2022)   PRAPARE - Administrator, Civil Service (Medical): No    Lack of Transportation (Non-Medical): No  Physical Activity: Not on file  Stress: Not on file  Social Connections: Moderately Isolated (02/23/2022)   Social Connection and Isolation Panel [NHANES]    Frequency of Communication with Friends and Family: More than three times a week    Frequency of Social Gatherings with Friends and Family: More than three times a week    Attends Religious Services: Never    Active Member of  Clubs or Organizations: No    Attends Banker Meetings: Never    Marital Status: Married  Catering manager Violence: Not At Risk (12/05/2022)   Humiliation, Afraid, Rape, and Kick questionnaire    Fear of Current or Ex-Partner: No    Emotionally Abused: No    Physically Abused: No    Sexually Abused: No   Family History  Adopted: Yes  Problem Relation Age of Onset   Diabetes Mother    Hypertension Mother    Kidney disease Mother    Diabetes Sister    Hypertension Sister     Current Facility-Administered Medications  Medication Dose Route Frequency Provider Last Rate Last Admin   0.9 %  sodium chloride infusion   Intravenous Continuous Victorino Sparrow, MD       0.9 %  sodium chloride infusion    Intravenous Continuous Shelton Silvas, MD 10 mL/hr at 12/13/22 0701 New Bag at 12/13/22 0701   ceFAZolin (ANCEF) IVPB 2g/100 mL premix  2 g Intravenous 30 min Pre-Op Victorino Sparrow, MD       chlorhexidine (HIBICLENS) 4 % liquid 4 Application  60 mL Topical Once Victorino Sparrow, MD       And   chlorhexidine (HIBICLENS) 4 % liquid 4 Application  60 mL Topical Once Victorino Sparrow, MD       fentaNYL (SUBLIMAZE) 100 MCG/2ML injection            insulin aspart (novoLOG) injection 0-7 Units  0-7 Units Subcutaneous Q2H PRN Shelton Silvas, MD       midazolam (VERSED) 2 MG/2ML injection             No Known Allergies   REVIEW OF SYSTEMS:  [X]  denotes positive finding, [ ]  denotes negative finding Cardiac  Comments:  Chest pain or chest pressure:    Shortness of breath upon exertion: X   Short of breath when lying flat:    Irregular heart rhythm:        Vascular    Pain in calf, thigh, or hip brought on by ambulation:    Pain in feet at night that wakes you up from your sleep:     Blood clot in your veins:    Leg swelling:         Pulmonary    Oxygen at home:    Productive cough:     Wheezing:         Neurologic    Sudden weakness in arms or legs:  X   Sudden numbness in arms or legs:  X   Sudden onset of difficulty speaking or slurred speech:    Temporary loss of vision in one eye:     Problems with dizziness:  X       Gastrointestinal    Blood in stool:     Vomited blood:         Genitourinary    Burning when urinating:     Blood in urine:        Psychiatric    Depression:  X       Hematologic    Bleeding problems:    Problems with blood clotting too easily:        Skin    Rashes or ulcers:        Constitutional    Fever or chills:      PHYSICAL EXAMINATION:  Vitals:   12/13/22 0610  BP: (!) 161/89  Pulse: 70  Resp: 18  Temp: 98.1 F (36.7 C)  TempSrc: Oral  SpO2: 98%  Weight: 78.9 kg  Height: 5\' 7"  (1.702 m)    General:  WDWN in NAD; vital  signs documented above Gait: Not observed HENT: WNL, normocephalic Pulmonary: normal non-labored breathing , without Rales, rhonchi,  wheezing Cardiac: regular HR Abdomen: soft, NT, no masses Skin: without rashes Vascular Exam/Pulses:  Right Left  Radial 1+ (weak) 1+ (weak)  Ulnar 2+ (normal) 2+ (normal)  Femoral    Popliteal    DP    PT     Extremities: without ischemic changes, without Gangrene , without cellulitis; without open wounds;  Musculoskeletal: no muscle wasting or atrophy  Neurologic: A&O X 3;  No focal weakness or paresthesias are detected Psychiatric:  The pt has Normal affect.   Non-Invasive Vascular Imaging:   Right Cephalic     Diameter (cm)   Depth (cm)        Findings            +-----------------+------------------+----------+-------------------------+   Shoulder               0.59                                             +-----------------+------------------+----------+-------------------------+   Prox upper arm          0.48                                             +-----------------+------------------+----------+-------------------------+   Mid upper arm           0.36                                             +-----------------+------------------+----------+-------------------------+   Dist upper arm          0.45                                             +-----------------+------------------+----------+-------------------------+   Antecubital fossa0.35 / 0.41 / 0.62          branching and PC  thrombus  +-----------------+------------------+----------+-------------------------+   Prox forearm            0.31                                             +-----------------+------------------+----------+-------------------------+   Mid forearm          0.24 0.41                                           +-----------------+------------------+----------+-------------------------+   Dist  forearm            0.21                                             +-----------------+------------------+----------+-------------------------+    +-----------------+-------------+----------+--------------+  Right Basilic    Diameter (cm)Depth (cm)   Findings     +-----------------+-------------+----------+--------------+  Shoulder                               not visualized  +-----------------+-------------+----------+--------------+  Prox upper arm       0.40                               +-----------------+-------------+----------+--------------+  Mid upper arm        0.36                               +-----------------+-------------+----------+--------------+  Dist upper arm       0.31                               +-----------------+-------------+----------+--------------+  Antecubital fossa    0.18                               +-----------------+-------------+----------+--------------+   +-----------------+-------------+----------+--------+  Left Cephalic    Diameter (cm)Depth (cm)Findings  +-----------------+-------------+----------+--------+  Shoulder            0.42                         +-----------------+-------------+----------+--------+  Prox upper arm    0.48 / 0.40                     +-----------------+-------------+----------+--------+  Mid upper arm        0.28                         +-----------------+-------------+----------+--------+  Dist upper arm    0.37 / 0.42                     +-----------------+-------------+----------+--------+  Antecubital fossa 0.60 / 0.36                     +-----------------+-------------+----------+--------+  Prox forearm         0.29                         +-----------------+-------------+----------+--------+  Mid forearm          0.27                         +-----------------+-------------+----------+--------+  Dist forearm      0.29 / 0.19                      +-----------------+-------------+----------+--------+   +-----------------+-------------+----------+--------------+  Left Basilic     Diameter (cm)Depth (cm)   Findings     +-----------------+-------------+----------+--------------+  Shoulder                               not visualized  +-----------------+-------------+----------+--------------+  Prox upper arm       0.36                prox to mid    +-----------------+-------------+----------+--------------+  Mid upper arm  0.35                               +-----------------+-------------+----------+--------------+  Dist upper arm       0.43                 branching     +-----------------+-------------+----------+--------------+  Antecubital fossa    0.24                               +-----------------+-------------+----------+--------------+  Prox forearm         0.15                               +-----------------+-------------+----------+--------------+     ASSESSMENT/PLAN:  Samuel Gardner is a 52 y.o. male who presents with chronic kidney disease stage 5  We had a long discussion regarding hemodialysis and fistula creation  Based on vein mapping and examination, [name] would be best served with left arm brachiocephalic fistula creation.  I was hoping that with his physique, and vein size, that he would be a candidate for left radiocephalic fistula, however he has a poor pulse with plaque and monophasic waveforms at the wrist. I had an extensive discussion with this patient in regards to the nature of access surgery, including risk, benefits, and alternatives.   The patient is aware that the risks of access surgery include but are not limited to: bleeding, infection, steal syndrome, nerve damage, ischemic monomelic neuropathy, failure of access to mature, complications related to venous hypertension, and possible need for additional access procedures in the  future.  I discussed with the patient the nature of the staged access procedure, specifically the need for a second operation to transpose the first stage fistula if it matures adequately.   The patient has agreed to proceed with the above procedure which will be scheduled at his convenience.  Victorino Sparrow, MD [facility] [phone]

## 2022-12-13 NOTE — OR Nursing (Signed)
Pr Dr. Hart Rochester, hold pre-op beta blocker. Pt normally takes medication in the afternoon and midnight. LD at 2330.   Viviano Simas, RN

## 2022-12-13 NOTE — Anesthesia Postprocedure Evaluation (Signed)
Anesthesia Post Note  Patient: Samuel Gardner  Procedure(s) Performed: LEFT ARM BRACHIOCEPHALIC ARTERIOVENOUS (AV) FISTULA CREATION (Left: Arm Upper)     Patient location during evaluation: PACU Anesthesia Type: Regional Level of consciousness: awake and alert Pain management: pain level controlled Vital Signs Assessment: post-procedure vital signs reviewed and stable Respiratory status: spontaneous breathing, nonlabored ventilation, respiratory function stable and patient connected to nasal cannula oxygen Cardiovascular status: stable and blood pressure returned to baseline Postop Assessment: no apparent nausea or vomiting Anesthetic complications: no  No notable events documented.  Last Vitals:  Vitals:   12/13/22 0900 12/13/22 0915  BP: 120/72 132/77  Pulse: (!) 59 62  Resp: 13 19  Temp: (!) 36.4 C (!) 36.4 C  SpO2: 94% 95%    Last Pain:  Vitals:   12/13/22 0900  TempSrc:   PainSc: 0-No pain                 Shelton Silvas

## 2022-12-13 NOTE — Op Note (Signed)
NAME: Samuel Gardner    MRN: [id number] DOB: 12-16-1970    DATE OF OPERATION: 12/13/2022  PREOP DIAGNOSIS:    Chronic kidney disease stage V  POSTOP DIAGNOSIS:    Same  PROCEDURE:    Left arm brachiocephalic fistula  SURGEON: Victorino Sparrow  ASSIST: Evelene Croon, PA  ANESTHESIA: Moderate, block  EBL: 5 mL  INDICATIONS:    [name] is a 52 y.o. male with CKD 5 in need of long-term HD access.  After discussing risk and benefits of left arm brachiocephalic fistula creation, [name] elected to proceed.  FINDINGS:   5 mm cephalic vein 5 mm brachial artery  TECHNIQUE:   The patient was brought to the operating room and placed in supine position. The left arm was prepped and draped in standard fashion. IV antibiotics were administered. A timeout was performed.   The case began with ultrasound insonation of the brachial artery and cephalic vein, which demonstrated sufficient size at the antecubital fossa for arteriovenous fistula.   A transverse incision was made below the elbow creese in the antecubital fossa. The  cephalic vein was isolated for 3 cm in length. Next the aponeurosis was partially released and the brachial artery secured with a vessel loop. The patient was heparinized. The cephalic vein was transected and ligated distally with a 2-0 silk stick-tie. The vein was dilated with coronary dilators and flushed with heparin saline. Vascular clamps were placed proximally and distally on the brachial artery and a 5 mm arteriotomy was created on the brachial artery. This was flushed with heparin saline.  An anastomosis was created in end to side fashion on the brachial artery using running 6-0 Prolene suture.  Prior to completing the anastomosis, the vessels were flushed and the suture line was tied down. There was an excellent thrill in the cephalic vein from the anastomosis to the mid upper bicipital region. The patient had a multiphasic radial and ulnar signal.  He had an excellent doppler signal in the fistula. The incision was irrigated and hemostasis achieved with cautery and suture. The deeper tissue was closed with 3-0 Vicryl and the skin closed with 4-0 Monocryl.  Dermabond was applied to the incisions. The patient was transferred to PACU in stable condition.  Given the complexity of the case,  the assistant was necessary in order to expedient the procedure and safely perform the technical aspects of the operation.  The assistant provided traction and countertraction to assist with exposure of the artery and vein.  They also assisted with suture ligation of multiple venous branches.  They played a critical role in the anastomosis. These skills, especially following the Prolene suture for the anastomosis, could not have been adequately performed by a scrub tech assistant.    Victorino Sparrow, MD [facility] DATE OF DICTATION:   12/13/2022

## 2022-12-13 NOTE — Anesthesia Procedure Notes (Signed)
Anesthesia Regional Block: Supraclavicular block   Pre-Anesthetic Checklist: , timeout performed,  Correct Patient, Correct Site, Correct Laterality,  Correct Procedure, Correct Position, site marked,  Risks and benefits discussed,  Surgical consent,  Pre-op evaluation,  At surgeon's request and post-op pain management  Laterality: Left  Prep: chloraprep       Needles:  Injection technique: Single-shot  Needle Type: Echogenic Stimulator Needle     Needle Length: 9cm  Needle Gauge: 21     Additional Needles:   Procedures:,,,, ultrasound used (permanent image in chart),,    Narrative:  Start time: 12/13/2022 7:20 AM End time: 12/13/2022 7:25 AM Injection made incrementally with aspirations every 5 mL.  Performed by: Personally  Anesthesiologist: Shelton Silvas, MD  Additional Notes: Discussed risks and benefits of the nerve block in detail, including but not limited vascular injury, permanent nerve damage and infection.   Patient tolerated the procedure well. Local anesthetic introduced in an incremental fashion under minimal resistance after negative aspirations. No paresthesias were elicited. After completion of the procedure, no acute issues were identified and patient continued to be monitored by RN.

## 2022-12-14 ENCOUNTER — Encounter (HOSPITAL_COMMUNITY): Payer: Self-pay | Admitting: Vascular Surgery

## 2022-12-14 ENCOUNTER — Ambulatory Visit (HOSPITAL_COMMUNITY)
Admission: RE | Admit: 2022-12-14 | Discharge: 2022-12-14 | Disposition: A | Discharge status: Home / Self Care | Payer: 59 | Source: Ambulatory Visit | Attending: Nephrology | Admitting: Nephrology

## 2022-12-14 DIAGNOSIS — N189 Chronic kidney disease, unspecified: Secondary | ICD-10-CM | POA: Diagnosis not present

## 2022-12-14 DIAGNOSIS — D631 Anemia in chronic kidney disease: Secondary | ICD-10-CM | POA: Insufficient documentation

## 2022-12-14 MED ORDER — SODIUM CHLORIDE 0.9 % IV SOLN
510.0000 mg | INTRAVENOUS | Status: DC
  Administered 2022-12-14: 510 mg via INTRAVENOUS
  Filled 2022-12-14: qty 510

## 2022-12-19 DIAGNOSIS — N185 Chronic kidney disease, stage 5: Secondary | ICD-10-CM | POA: Diagnosis not present

## 2022-12-21 ENCOUNTER — Ambulatory Visit: Payer: 59 | Attending: Cardiology

## 2022-12-21 DIAGNOSIS — Z79899 Other long term (current) drug therapy: Secondary | ICD-10-CM | POA: Diagnosis not present

## 2022-12-21 DIAGNOSIS — E782 Mixed hyperlipidemia: Secondary | ICD-10-CM | POA: Diagnosis not present

## 2022-12-22 DIAGNOSIS — Z01818 Encounter for other preprocedural examination: Secondary | ICD-10-CM | POA: Diagnosis not present

## 2022-12-22 DIAGNOSIS — N186 End stage renal disease: Secondary | ICD-10-CM | POA: Diagnosis not present

## 2022-12-22 LAB — ALT: ALT: 10 [IU]/L (ref 0–44)

## 2022-12-22 LAB — LIPID PANEL
Chol/HDL Ratio: 3.5 {ratio} (ref 0.0–5.0)
Cholesterol, Total: 142 mg/dL (ref 100–199)
HDL: 41 mg/dL (ref >39)
LDL Chol Calc (NIH): 86 mg/dL (ref 0–99)
Triglycerides: 77 mg/dL (ref 0–149)
VLDL Cholesterol Cal: 15 mg/dL (ref 5–40)

## 2022-12-23 ENCOUNTER — Telehealth: Payer: Self-pay

## 2022-12-23 ENCOUNTER — Other Ambulatory Visit: Payer: Self-pay

## 2022-12-23 DIAGNOSIS — E782 Mixed hyperlipidemia: Secondary | ICD-10-CM

## 2022-12-23 MED ORDER — ATORVASTATIN CALCIUM 80 MG PO TABS: 80.0000 mg | ORAL_TABLET | Freq: Every day | ORAL | 3 refills | Status: DC

## 2022-12-23 NOTE — Telephone Encounter (Signed)
-----   Message from Armanda Magic sent at 12/22/2022  8:17 PM EDT ----- LDL not at goal - increase Atorvastatin to 80mg  daily and repeat FLP and ALT in 6 weeks

## 2022-12-23 NOTE — Telephone Encounter (Signed)
Spoke with patient, agrees with the increase of atorvastation to 80 mg daily and repeat lab work. Per patient, use [facility] and will have this completed on or after 02/03/23 - orders placed. No further questions at this time

## 2022-12-25 NOTE — Progress Notes (Unsigned)
Cardiology Office Note    Patient Name: Samuel Gardner Date of Encounter: 12/26/2022  Primary Care Provider:  Rocky Morel, DO Primary Cardiologist:  Armanda Magic, MD Primary Electrophysiologist: None   Past Medical History    Past Medical History:  Diagnosis Date   (HFpEF) heart failure with preserved ejection fraction [facility])    Ascending aorta dilatation (HCC)    40mm by echo 11/2022   CKD (chronic kidney disease), stage IV (HCC)    Diabetes mellitus without complication (HCC)    Hypertension    Mitral regurgitation    mild by echo 11/2022   Normocytic anemia     History of Present Illness  Samuel Gardner is a 52 y.o. male with a PMH of HFpEF, HTN, HLD, tobacco abuse, DM type II, CKD stage IV, COPD who presents today for 15-month follow-up.  Mr. Bruun was seen initially in 01/2022 for evaluation of chest pain, shortness of breath and lower extremity edema in the ED.  Patient's BNP was elevated at 758 and troponins were mildly elevated as well.  EKG was completed showing no evidence of ACS 2D echo was completed showing EF of 55-60% with grade 1 DD and blood pressures were 234/102 with elevation of both troponin found to be secondary to hypertensive urgency.  Patient had carvedilol increased and Imdur was added and found not to be a candidate for ACE/ARB/ARNI/MRA due to renal function.  He was seen in follow-up on 02/07/2022 patient had pitting edema up to his knees with shortness of breath.  Patient's Norvasc was decreased to 5 mg and Imdur was increased to 60 with plan to titrate further if persist with better BP control.  He was referred to nephrology due to elevation in creatinine and seen in follow-up 04/02/2022 by Dr. Mayford Knife.  BP was noted to be improved and patient was sent for PET/CT for evaluation of coronary anatomy.  Test results revealed evidence of microvascular disease with decreased myocardial blood flow reserve and Imdur was increased to 90 mg daily.    He was last  seen on 10/2022 for follow-up visit and reported doing well but endorsed worsening shortness of breath which he felt was related to the outdoor heat.  He also reported shortness of breath with lying down and sleeping.  He noted that his breathing improved with Lasix which is managed by his nephrologist due to his CKD.  BP was noted to be elevated hydralazine was increased to 100 mg 3 times daily.  2D echo was repeated showing normal EF of 60 to 65% with moderate LVH and grade 1 DD with moderately dilated LA with no evidence of MVR and mild aortic dilation.  Patient's kidney function continued to worsen and was advised to be seen in the ED following day with volume status improving with IV Lasix.  He was referred to vascular surgery for mapping and fistula creation which was completed on 12/13/2022. He is currently completing pretransplant evaluation at [facility].  During today's visit the patient reports that he has been doing well with improvement to shortness of breath since previous visit.  He has been watching and monitoring his fluid intake and denies any indiscretions with salt.  His blood pressure today was elevated at 184/86 and was 178/82 on recheck.  He reports at home that his blood pressures are much better controlled.  He has been compliant with his medications and denies any adverse reactions.  He was euvolemic on examination today.  He reports drinking 30 to  40 ounces of water daily and has been more cognizant of his intake since his previous hospitalization. Patient denies chest pain, palpitations, dyspnea, PND, orthopnea, nausea, vomiting, dizziness, syncope, edema, weight gain, or early satiety.   Review of Systems  Please see the history of present illness.    All other systems reviewed and are otherwise negative except as noted above.  Physical Exam    Wt Readings from Last 3 Encounters:  12/26/22 181 lb 6.4 oz (82.3 kg)  12/13/22 174 lb (78.9 kg)  12/07/22 174 lb (78.9 kg)    VS: Vitals:   12/26/22 1416 12/26/22 1437  BP: (!) 184/86 (!) 178/82  Pulse: 72   SpO2: 98%   ,Body mass index is 28.41 kg/m. GEN: Well nourished, well developed in no acute distress Neck: No JVD; No carotid bruits Pulmonary: Clear to auscultation without rales, wheezing or rhonchi  Cardiovascular: Normal rate. Regular rhythm. Normal S1. Normal S2.   Murmurs: There is no murmur.  ABDOMEN: Soft, non-tender, non-distended EXTREMITIES:  No edema; No deformity   EKG/LABS/ Recent Cardiac Studies   ECG personally reviewed by me today -none completed today  Risk Assessment/Calculations:          Lab Results  Component Value Date   WBC 7.4 12/05/2022   HGB 9.2 (L) 12/13/2022   HCT 27.0 (L) 12/13/2022   MCV 85 12/05/2022   PLT 301 12/05/2022   Lab Results  Component Value Date   CREATININE 5.50 (H) 12/13/2022   BUN 53 (H) 12/13/2022   NA 143 12/13/2022   K 3.7 12/13/2022   CL 112 (H) 12/13/2022   CO2 21 12/05/2022   Lab Results  Component Value Date   CHOL 142 12/21/2022   HDL 41 12/21/2022   LDLCALC 86 12/21/2022   TRIG 77 12/21/2022   CHOLHDL 3.5 12/21/2022    Lab Results  Component Value Date   HGBA1C 6.1 (H) 11/11/2022   Assessment & Plan    1.  HFpEF: -Patient's last 2D echo showed EF of 60 to 65% with no RWMA and grade 1 DD -Today patient is euvolemic on examination. -He reports moderating his fluid intake to 30 to 40 ounces daily. -His volume is being managed by nephrology and patient is currently taking Lasix 80 mg twice daily -Low sodium diet, fluid restriction <2L, and daily weights encouraged. Educated to contact our office for weight gain of 2 lbs overnight or 5 lbs in one week.   2.  Coronary calcifications: -Coronary stress PET/CT completed showing no ischemia however microvascular disease noted.  Mild coronary calcifications also reported -Today patient reports no chest pain or anginal equivalent. -Continue Lipitor 80 mg daily and Imdur 90 mg  daily  3.  Hypertensive urgency: -HYPERTENSION CONTROL Vitals:   12/26/22 1416 12/26/22 1437  BP: (!) 184/86 (!) 178/82    The patient's blood pressure is elevated above target today.  In order to address the patient's elevated BP: Blood pressure will be monitored at home to determine if medication changes need to be made.; Follow up with general cardiology has been recommended.   Continue Norvasc 5 mg daily, carvedilol 25 mg twice daily, Apresoline 100 mg 3 times daily -Patient and spouse both report blood pressures are controlled better at home and elevated in the office. -Patient will monitor blood pressures over the next 2 to 3 weeks and return readings back to our office.  4.  CKD stage IV: -Patient currently followed by nephrology and recently had AV fistula created  due to worsening kidney function -He is currently being followed and evaluated by [facility] for possible kidney transplant.  5.  Hyperlipidemia: -Patient's last LDL cholesterol was improved at 86 -Continue Lipitor 80 mg daily  Disposition: Follow-up with Armanda Magic, MD or APP in 3 months    Signed, Napoleon Form, Leodis Rains, NP 12/26/2022, 4:51 PM [facility]

## 2022-12-26 ENCOUNTER — Encounter: Payer: Self-pay | Admitting: Nurse Practitioner

## 2022-12-26 ENCOUNTER — Ambulatory Visit: Payer: 59 | Attending: Nurse Practitioner | Admitting: Nurse Practitioner

## 2022-12-26 VITALS — BP 178/82 | HR 72 | Ht 67.0 in | Wt 181.4 lb

## 2022-12-26 DIAGNOSIS — N184 Chronic kidney disease, stage 4 (severe): Secondary | ICD-10-CM | POA: Diagnosis not present

## 2022-12-26 DIAGNOSIS — I5032 Chronic diastolic (congestive) heart failure: Secondary | ICD-10-CM

## 2022-12-26 DIAGNOSIS — I2510 Atherosclerotic heart disease of native coronary artery without angina pectoris: Secondary | ICD-10-CM

## 2022-12-26 DIAGNOSIS — I160 Hypertensive urgency: Secondary | ICD-10-CM | POA: Diagnosis not present

## 2022-12-26 DIAGNOSIS — E782 Mixed hyperlipidemia: Secondary | ICD-10-CM | POA: Diagnosis not present

## 2022-12-26 NOTE — Patient Instructions (Addendum)
Medication Instructions:  Your physician recommends that you continue on your current medications as directed. Please refer to the Current Medication list given to you today. *If you need a refill on your cardiac medications before your next appointment, please call your pharmacy*   Lab Work: None ordered   Testing/Procedures: None ordered   Follow-Up: At [facility], you and your health needs are our priority.  As part of our continuing mission to provide you with exceptional heart care, we have created designated Provider Care Teams.  These Care Teams include your primary Cardiologist (physician) and Advanced Practice Providers (APPs -  Physician Assistants and Nurse Practitioners) who all work together to provide you with the care you need, when you need it.  We recommend signing up for the patient portal called "MyChart".  Sign up information is provided on this After Visit Summary.  MyChart is used to connect with patients for Virtual Visits (Telemedicine).  Patients are able to view lab/test results, encounter notes, upcoming appointments, etc.  Non-urgent messages can be sent to your provider as well.   To learn more about what you can do with MyChart, go to [email or web address].    Your next appointment:   3 month(s)  Provider:   Armanda Magic, MD     Other Instructions: Please check your weight daily. Please contact the office if you gain more than 2lbs in a day or 5lbs in a week.  Limit your salt intake to 1500-2000mg  per day or 500mg  of Sodium per meal.   Check your blood pressure daily for 2 weeks, then contact the office with your readings.  Make sure to check 2 hours after your medications.   AVOID these things for 30 minutes before checking your blood pressure: No Drinking caffeine. No Drinking alcohol. No Eating. No Smoking. No Exercising.  Five minutes before checking your blood pressure: Pee. Sit in a dining chair. Avoid sitting in a soft couch  or armchair. Be quiet. Do not talk.

## 2023-01-12 ENCOUNTER — Encounter: Payer: Self-pay | Admitting: Student

## 2023-01-13 ENCOUNTER — Encounter: Payer: Self-pay | Admitting: Gastroenterology

## 2023-01-19 ENCOUNTER — Other Ambulatory Visit: Payer: Self-pay | Admitting: *Deleted

## 2023-01-19 DIAGNOSIS — N184 Chronic kidney disease, stage 4 (severe): Secondary | ICD-10-CM

## 2023-01-26 ENCOUNTER — Ambulatory Visit (INDEPENDENT_AMBULATORY_CARE_PROVIDER_SITE_OTHER): Payer: 59 | Admitting: Physician Assistant

## 2023-01-26 ENCOUNTER — Ambulatory Visit (HOSPITAL_COMMUNITY)
Admission: RE | Admit: 2023-01-26 | Discharge: 2023-01-26 | Disposition: A | Discharge status: Home / Self Care | Payer: 59 | Source: Ambulatory Visit | Attending: Vascular Surgery | Admitting: Vascular Surgery

## 2023-01-26 ENCOUNTER — Encounter: Payer: Self-pay | Admitting: Physician Assistant

## 2023-01-26 VITALS — BP 189/89 | HR 69 | Temp 98.8°F | Wt 182.5 lb

## 2023-01-26 DIAGNOSIS — N184 Chronic kidney disease, stage 4 (severe): Secondary | ICD-10-CM

## 2023-01-26 NOTE — Progress Notes (Signed)
Postoperative Access Visit   History of Present Illness   Samuel Gardner is a 52 y.o. year old male who presents for postoperative follow-up for: left arm brachiocephalic AV fistula by Dr. Karin Lieu on 12/13/22. The patient's wounds are well healed.  The patient notes no steal symptoms.  The patient is  able to complete their activities of daily living.  The patient's current symptoms are: occasionally left forearm feels cold to the touch and also gets cramping but this occurs all over his body. He is currently not on HD. He is followed by Dr. Malen Gauze with Nephrology. His next follow up is on 02/14/23  Physical Examination   Vitals:   01/26/23 1402  BP: (!) 189/89  Pulse: 69  Temp: 98.8 F (37.1 C)  TempSrc: Temporal  Weight: 182 lb 8 oz (82.8 kg)   Body mass index is 28.58 kg/m.  left arm Incision is well healed, 2+ radial pulse, hand grip is 5/5, sensation in digits is intact, palpable thrill, bruit can  be auscultated    Non invasive studies: Findings:  +--------------------+----------+-----------------+--------+  AVF                PSV (cm/s)Flow Vol (mL/min)Comments  +--------------------+----------+-----------------+--------+  Native artery inflow   622          2414                 +--------------------+----------+-----------------+--------+  AVF Anastomosis        787                               +--------------------+----------+-----------------+--------+     +------------+----------+------------+----------+--------------------------  ----+  OUTFLOW VEINPSV (cm/s)  Diameter  Depth (cm)           Describe                                        (cm)                                               +------------+----------+------------+----------+--------------------------  ----+  Prox UA        150        0.79       0.64     competing branch  measuring                                                             .192                 +------------+----------+------------+----------+--------------------------  ----+  Mid UA         283        0.76       0.30                                    +------------+----------+------------+----------+--------------------------  ----+  Dist UA        185  0.67       0.21                                    +------------+----------+------------+----------+--------------------------  ----+  AC Fossa       266        1.02       0.23                                    +------------+----------+------------+----------+--------------------------  ----+    Summary: Patent arteriovenous fistula. Elevated velocities noted in the inflow artery. Competing branch in the proximal upper arm.   Medical Decision Making   Samuel Gardner is a 52 y.o. year old male who presents s/p left arm brachiocephalic AV fistula by Dr. Karin Lieu on 12/13/22. The patient's wounds are well healed.  The patient notes no steal symptoms. Incision is well healed. Fistula has a great thrill and is easily palpable in left upper arm. Duplex today shows patent fistula with excellent volume flow. The patient's access will be ready for use 03/14/22 He will continue to follow up with Dr. Malen Gauze for management of his CKD V The patient may follow up on a prn basis   Graceann Congress, [name] [facility] Office: [phone]  Clinic MD: Karin Lieu

## 2023-02-02 ENCOUNTER — Telehealth: Payer: Self-pay | Admitting: *Deleted

## 2023-02-06 DIAGNOSIS — E782 Mixed hyperlipidemia: Secondary | ICD-10-CM | POA: Diagnosis not present

## 2023-02-06 NOTE — Telephone Encounter (Signed)
Informed pt that  due to his health issues he is safer to be done at the hosptial no at the office  Pt stated he understood. PV will still happen on 12/3 and informed pt that the PV isa good for 3 months. No other questions at this time. Pt states he will look out for call from MD RN.

## 2023-02-06 NOTE — Telephone Encounter (Signed)
Dr Lavon Paganini does this patient need an office visit?  Danton Sewer, RN  You; Napoleon Form, MD(9:22 AM)   VB GM! This patient needs to his procedure done at the hospital. Could you help facilitate this Beth? Thank you Ferdie Ping, Jonny Ruiz, CRNA  [place], Renaldo Reel, RN2 days ago   Melbourne Abts,  This pt is in such severe kidney failure he is an ASA IV and his procedure will need to be done at the hospital.  Thanks,  Leeanne Deed, RN  Cathlyn Parsons, CRNA4 days ago   VB John wanted to head you up on this pt who is currently scheduled for 02/12/23 here. He just recently had a AV fistula placed  12/13/22 for CKD stage 4. He had a recent Echo 9/5 EF 60%-65%.  Please review to see if OK to have procedure here or needs to be rescheduled for hosptial. Thank you Chip Boer

## 2023-02-07 LAB — LIPID PANEL
Chol/HDL Ratio: 2.5 {ratio} (ref 0.0–5.0)
Cholesterol, Total: 106 mg/dL (ref 100–199)
HDL: 42 mg/dL (ref >39)
LDL Chol Calc (NIH): 48 mg/dL (ref 0–99)
Triglycerides: 82 mg/dL (ref 0–149)
VLDL Cholesterol Cal: 16 mg/dL (ref 5–40)

## 2023-02-07 LAB — ALT: ALT: 22 [IU]/L (ref 0–44)

## 2023-02-08 ENCOUNTER — Telehealth: Payer: Self-pay

## 2023-02-08 NOTE — Telephone Encounter (Signed)
-----   Message from Armanda Magic sent at 02/07/2023  8:32 AM EST ----- Lipids at goal continue current therapy and forward to PCP

## 2023-02-08 NOTE — Telephone Encounter (Signed)
Call to discuss results, no answer, left detailed message per DPR explaining cholesterol levels normal and to continue current medications. Advised a copy of labs would be forwarded to PCP. Advised patient to call our office if any questions.

## 2023-02-12 ENCOUNTER — Telehealth: Payer: Self-pay | Admitting: *Deleted

## 2023-02-12 NOTE — Telephone Encounter (Signed)
Yesi,  This pt has Stage V end stage renal failure and is about to start hemodialysis; his procedure will need to be done at the hospital.  Thanks,  Cathlyn Parsons

## 2023-02-13 NOTE — Telephone Encounter (Signed)
PT called with wife to confirm appointment for 12/5

## 2023-02-13 NOTE — Telephone Encounter (Signed)
This issue is being addressed. RN to request update on OV vs direct to hospital and conduct his good for 3 month PV 12/2 as planned

## 2023-02-13 NOTE — Telephone Encounter (Signed)
PT notified of dater and time of OV.

## 2023-02-14 DIAGNOSIS — N185 Chronic kidney disease, stage 5: Secondary | ICD-10-CM | POA: Diagnosis not present

## 2023-02-14 DIAGNOSIS — N189 Chronic kidney disease, unspecified: Secondary | ICD-10-CM | POA: Diagnosis not present

## 2023-02-16 ENCOUNTER — Encounter: Payer: Self-pay | Admitting: Physician Assistant

## 2023-02-16 ENCOUNTER — Ambulatory Visit (INDEPENDENT_AMBULATORY_CARE_PROVIDER_SITE_OTHER): Payer: 59 | Admitting: Physician Assistant

## 2023-02-16 VITALS — BP 150/80 | HR 66 | Ht 67.0 in | Wt 187.5 lb

## 2023-02-16 DIAGNOSIS — N184 Chronic kidney disease, stage 4 (severe): Secondary | ICD-10-CM | POA: Diagnosis not present

## 2023-02-16 DIAGNOSIS — Z1211 Encounter for screening for malignant neoplasm of colon: Secondary | ICD-10-CM

## 2023-02-16 MED ORDER — NA SULFATE-K SULFATE-MG SULF 17.5-3.13-1.6 GM/177ML PO SOLN: 1.0000 | Freq: Once | ORAL | 0 refills | Status: AC

## 2023-02-16 NOTE — Progress Notes (Signed)
Chief Complaint: Discuss colonoscopy  HPI:    [name] is a 52 year old African-American male with a past medical history as listed below including heart failure preserved ejection fraction (11/17/2022 echo with LVEF 60-65%), CKD stage IV and diabetes, who was referred to me by Rocky Morel, DO for a sideration of a colonoscopy.    01/26/2023 patient saw vascular surgery in regards to CKD stage IV.  At that time following up after AV fistula creation on 12/13/2022.  Plans to start hemodialysis.    02/12/2023 note from Cathlyn Parsons our CNA discusses that patient is in stage IV end-stage renal failure is about to start hemodialysis if he needs procedures at the hospital.    Today, the patient presents to clinic accompanied by his wife, who does assist with history, and tells me that he does have an AV fistula, but has not yet had to start hemodialysis, but is "teetering on the edge", he is also on the kidney transplant list.  He has never had a screening colonoscopy and needs one done for screening as well as prior to possible kidney transplant in the future.  Denies any acute GI issues or complaints.    Denies fever, chills or weight loss.  Past Medical History:  Diagnosis Date   (HFpEF) heart failure with preserved ejection fraction (HCC)    Ascending aorta dilatation (HCC)    40mm by echo 11/2022   CKD (chronic kidney disease), stage IV (HCC)    Diabetes mellitus without complication (HCC)    Hypertension    Mitral regurgitation    mild by echo 11/2022   Normocytic anemia     Past Surgical History:  Procedure Laterality Date   AV FISTULA PLACEMENT Left 12/13/2022   Procedure: LEFT ARM BRACHIOCEPHALIC ARTERIOVENOUS (AV) FISTULA CREATION;  Surgeon: Victorino Sparrow, MD;  Location: [facility] OR;  Service: Vascular;  Laterality: Left;   LEG SURGERY Right    teenager    Current Outpatient Medications  Medication Sig Dispense Refill   calcitRIOL (ROCALTROL) 0.25 MCG capsule Take 0.25 mcg by mouth  3 (three) times a week.     KIONEX 15 GM/60ML suspension Take 15 g by mouth 2 (two) times a week.     metolazone (ZAROXOLYN) 5 MG tablet Take 5 mg by mouth once a week.     albuterol (VENTOLIN HFA) 108 (90 Base) MCG/ACT inhaler Inhale 2 puffs into the lungs every 6 (six) hours as needed for wheezing or shortness of breath. 6.7 g 2   amLODipine (NORVASC) 5 MG tablet Take 1 tablet (5 mg total) by mouth daily. 90 tablet 3   atorvastatin (LIPITOR) 80 MG tablet Take 1 tablet (80 mg total) by mouth daily. 90 tablet 3   carvedilol (COREG) 25 MG tablet Take 1 tablet (25 mg total) by mouth 2 (two) times daily. 180 tablet 3   cholecalciferol (VITAMIN D3) 25 MCG (1000 UNIT) tablet Take 1,000 Units by mouth daily.     ferrous sulfate 325 (65 FE) MG EC tablet Take 325 mg by mouth in the morning and at bedtime.     furosemide (LASIX) 80 MG tablet Take 1 tablet (80 mg total) by mouth 2 (two) times daily. 120 tablet 3   hydrALAZINE (APRESOLINE) 100 MG tablet Take 1 tablet (100 mg total) by mouth 3 (three) times daily. 270 tablet 3   isosorbide mononitrate (IMDUR) 30 MG 24 hr tablet Take 3 tablets (90 mg total) by mouth daily. 270 tablet 3   nitroGLYCERIN (NITROSTAT)  0.4 MG SL tablet Place 1 tablet (0.4 mg total) under the tongue every 5 (five) minutes as needed for chest pain. 25 tablet 3   oxyCODONE-acetaminophen (PERCOCET) 5-325 MG tablet Take 1 tablet by mouth every 6 (six) hours as needed for severe pain. 8 tablet 0   polyethylene glycol powder (GAVILAX) 17 GM/SCOOP powder Take 17 g by mouth daily as needed for mild constipation. 238 g 0   No current facility-administered medications for this visit.    Allergies as of 02/16/2023   (No Known Allergies)    Family History  Adopted: Yes  Problem Relation Age of Onset   Diabetes Mother    Hypertension Mother    Kidney disease Mother    Diabetes Sister    Hypertension Sister     Social History   Socioeconomic History   Marital status: Married     Spouse name: Not on file   Number of children: Not on file   Years of education: Not on file   Highest education level: Not on file  Occupational History   Not on file  Tobacco Use   Smoking status: Former    Current packs/day: 0.00    Types: Cigarettes    Quit date: 01/30/2022    Years since quitting: 1.0   Smokeless tobacco: Never  Vaping Use   Vaping status: Never Used  Substance and Sexual Activity   Alcohol use: Not Currently   Drug use: No   Sexual activity: Not on file  Other Topics Concern   Not on file  Social History Narrative   Not on file   Social Determinants of Health   Financial Resource Strain: Not on file  Food Insecurity: No Food Insecurity (12/05/2022)   Hunger Vital Sign    Worried About Running Out of Food in the Last Year: Never true    Ran Out of Food in the Last Year: Never true  Transportation Needs: No Transportation Needs (12/05/2022)   PRAPARE - Administrator, Civil Service (Medical): No    Lack of Transportation (Non-Medical): No  Physical Activity: Not on file  Stress: Not on file  Social Connections: Moderately Isolated (02/23/2022)   Social Connection and Isolation Panel [NHANES]    Frequency of Communication with Friends and Family: More than three times a week    Frequency of Social Gatherings with Friends and Family: More than three times a week    Attends Religious Services: Never    Database administrator or Organizations: No    Attends Banker Meetings: Never    Marital Status: Married  Catering manager Violence: Not At Risk (12/05/2022)   Humiliation, Afraid, Rape, and Kick questionnaire    Fear of Current or Ex-Partner: No    Emotionally Abused: No    Physically Abused: No    Sexually Abused: No    Review of Systems:    Constitutional: No weight loss, fever or chills Skin: No rash  Cardiovascular: No chest pain Respiratory: No SOB  Gastrointestinal: See HPI and otherwise negative Genitourinary: No  dysuria  Neurological: No headache, dizziness or syncope Musculoskeletal: No new muscle or joint pain Hematologic: No bleeding Psychiatric: No history of depression or anxiety   Physical Exam:  Vital signs: BP (!) 150/80   Pulse 66   Ht 5\' 7"  (1.702 m)   Wt 187 lb 8 oz (85 kg)   SpO2 100%   BMI 29.37 kg/m    Constitutional:   Pleasant AA male  appears to be in NAD, Well developed, Well nourished, alert and cooperative Head:  Normocephalic and atraumatic. Eyes:   PEERL, EOMI. No icterus. Conjunctiva pink. Ears:  Normal auditory acuity. Neck:  Supple Throat: Oral cavity and pharynx without inflammation, swelling or lesion.  Respiratory: Respirations even and unlabored. Lungs clear to auscultation bilaterally.   No wheezes, crackles, or rhonchi.  Cardiovascular: Normal S1, S2. No MRG. Regular rate and rhythm. No peripheral edema, cyanosis or pallor.  Gastrointestinal:  Soft, nondistended, nontender. No rebound or guarding. Normal bowel sounds. No appreciable masses or hepatomegaly. Rectal:  Not performed.  Msk:  Symmetrical without gross deformities. Without edema, no deformity or joint abnormality.  Neurologic:  Alert and  oriented x4;  grossly normal neurologically.  Skin:   Dry and intact without significant lesions or rashes. +Av fistula left arm Psychiatric: Demonstrates good judgement and reason without abnormal affect or behaviors.  RELEVANT LABS AND IMAGING: CBC    Component Value Date/Time   WBC 7.4 12/05/2022 1633   WBC 7.6 11/12/2022 0341   RBC 3.74 (L) 12/05/2022 1633   RBC 3.35 (L) 11/12/2022 0341   HGB 9.2 (L) 12/13/2022 0622   HGB 10.2 (L) 12/05/2022 1633   HCT 27.0 (L) 12/13/2022 0622   HCT 31.7 (L) 12/05/2022 1633   PLT 301 12/05/2022 1633   MCV 85 12/05/2022 1633   MCH 27.3 12/05/2022 1633   MCH 27.8 11/12/2022 0341   MCHC 32.2 12/05/2022 1633   MCHC 32.4 11/12/2022 0341   RDW 13.9 12/05/2022 1633   LYMPHSABS 1.2 11/10/2022 1757   LYMPHSABS 1.4  02/22/2022 1617   MONOABS 0.9 11/10/2022 1757   EOSABS 0.2 11/10/2022 1757   EOSABS 0.3 02/22/2022 1617   BASOSABS 0.1 11/10/2022 1757   BASOSABS 0.1 02/22/2022 1617    CMP     Component Value Date/Time   NA 143 12/13/2022 0622   NA 142 12/05/2022 1633   K 3.7 12/13/2022 0622   CL 112 (H) 12/13/2022 0622   CO2 21 12/05/2022 1633   GLUCOSE 110 (H) 12/13/2022 0622   BUN 53 (H) 12/13/2022 0622   BUN 56 (H) 12/05/2022 1633   CREATININE 5.50 (H) 12/13/2022 0622   CALCIUM 8.6 (L) 12/05/2022 1633   PROT 5.3 (L) 11/10/2022 1757   ALBUMIN 2.2 (L) 11/12/2022 0341   AST 17 11/10/2022 1757   ALT 22 02/06/2023 1335   ALKPHOS 74 11/10/2022 1757   BILITOT 0.5 11/10/2022 1757   GFRNONAA 14 (L) 11/12/2022 0341   GFRAA >60 07/24/2016 1115    Assessment: 1.  Screening for colorectal cancer: Patient is 29 and never had screening for colon cancer 2.  CKD stage IV: Undergoing evaluation for possible transplant in the future, just had an AV fistula placed, has not had to start hemodialysis yet  Plan: 1.  Per Cathlyn Parsons patient need to be scheduled at the hospital given his CKD stage IV and possible impending hemodialysis.  Patient was scheduled with Dr. Tomasa Rand as he had openings.  Did provide the patient a detailed list of risks for the procedure and he agrees to proceed.  Dr. Tomasa Rand will continue to be the patient's primary GI physician here. 2.  Patient to follow in clinic per recommendations after time of procedure.  Hyacinth Meeker, [name] [facility] 02/16/2023, 9:54 AM  Cc: Rocky Morel, DO

## 2023-02-16 NOTE — Patient Instructions (Addendum)
You have been scheduled for a colonoscopy. Please follow written instructions given to you at your visit today.   Please pick up your prep supplies at the pharmacy within the next 1-3 days.  If you use inhalers (even only as needed), please bring them with you on the day of your procedure.  DO NOT TAKE 7 DAYS PRIOR TO TEST- Trulicity (dulaglutide) Ozempic, Wegovy (semaglutide) Mounjaro (tirzepatide) Bydureon Bcise (exanatide extended release)  DO NOT TAKE 1 DAY PRIOR TO YOUR TEST Rybelsus (semaglutide) Adlyxin (lixisenatide) Victoza (liraglutide) Byetta (exanatide)  _______________________________________________________  If your blood pressure at your visit was 140/90 or greater, please contact your primary care physician to follow up on this.  _______________________________________________________  If you are age 44 or older, your body mass index should be between 23-30. Your Body mass index is 29.37 kg/m. If this is out of the aforementioned range listed, please consider follow up with your Primary Care Provider.  If you are age 36 or younger, your body mass index should be between 19-25. Your Body mass index is 29.37 kg/m. If this is out of the aformentioned range listed, please consider follow up with your Primary Care Provider.   ________________________________________________________  The  GI providers would like to encourage you to use [facility] to communicate with providers for non-urgent requests or questions.  Due to long hold times on the telephone, sending your provider a message by [facility] may be a faster and more efficient way to get a response.  Please allow 48 business hours for a response.  Please remember that this is for non-urgent requests.   It was a pleasure to see you today!  Thank you for trusting me with your gastrointestinal care!    Hyacinth Meeker, [name]

## 2023-02-20 NOTE — Progress Notes (Signed)
Agree with the assessment and plan as outlined by Hyacinth Meeker, [name].  Agree with performing colonoscopy in hospital setting given his advanced kidney disease  Shanautica Forker E. Tomasa Rand, MD

## 2023-02-28 ENCOUNTER — Encounter: Payer: 59 | Admitting: Gastroenterology

## 2023-03-02 ENCOUNTER — Ambulatory Visit (HOSPITAL_COMMUNITY)
Admission: RE | Admit: 2023-03-02 | Discharge: 2023-03-02 | Disposition: A | Discharge status: Home / Self Care | Payer: 59 | Source: Ambulatory Visit | Attending: Nephrology | Admitting: Nephrology

## 2023-03-02 VITALS — BP 158/80 | HR 69 | Temp 97.7°F | Resp 17

## 2023-03-02 DIAGNOSIS — D631 Anemia in chronic kidney disease: Secondary | ICD-10-CM | POA: Diagnosis not present

## 2023-03-02 DIAGNOSIS — N185 Chronic kidney disease, stage 5: Secondary | ICD-10-CM | POA: Diagnosis present

## 2023-03-02 DIAGNOSIS — Z79890 Hormone replacement therapy (postmenopausal): Secondary | ICD-10-CM | POA: Diagnosis not present

## 2023-03-02 LAB — POCT HEMOGLOBIN-HEMACUE: Hemoglobin: 8.2 g/dL — ABNORMAL LOW (ref 13.0–17.0)

## 2023-03-02 MED ORDER — EPOETIN ALFA 10000 UNIT/ML IJ SOLN
10000.0000 [IU] | INTRAMUSCULAR | Status: DC
  Administered 2023-03-02: 10000 [IU] via SUBCUTANEOUS

## 2023-03-02 MED ORDER — EPOETIN ALFA 10000 UNIT/ML IJ SOLN
INTRAMUSCULAR | Status: AC
  Filled 2023-03-02: qty 1

## 2023-03-16 ENCOUNTER — Encounter (HOSPITAL_COMMUNITY)
Admission: RE | Admit: 2023-03-16 | Discharge: 2023-03-16 | Disposition: A | Discharge status: Home / Self Care | Payer: Managed Care, Other (non HMO) | Source: Ambulatory Visit | Attending: Nephrology | Admitting: Nephrology

## 2023-03-16 ENCOUNTER — Encounter (HOSPITAL_COMMUNITY): Payer: Self-pay

## 2023-03-16 VITALS — BP 173/81 | HR 66 | Temp 97.5°F | Resp 17

## 2023-03-16 DIAGNOSIS — N185 Chronic kidney disease, stage 5: Secondary | ICD-10-CM | POA: Insufficient documentation

## 2023-03-16 LAB — POCT HEMOGLOBIN-HEMACUE: Hemoglobin: 8.7 g/dL — ABNORMAL LOW (ref 13.0–17.0)

## 2023-03-16 MED ORDER — EPOETIN ALFA 20000 UNIT/ML IJ SOLN
INTRAMUSCULAR | Status: AC
  Filled 2023-03-16: qty 1

## 2023-03-16 MED ORDER — EPOETIN ALFA 20000 UNIT/ML IJ SOLN
20000.0000 [IU] | INTRAMUSCULAR | Status: DC
  Administered 2023-03-16: 20000 [IU] via SUBCUTANEOUS

## 2023-03-17 ENCOUNTER — Other Ambulatory Visit: Payer: Self-pay | Admitting: Internal Medicine

## 2023-03-17 DIAGNOSIS — I5032 Chronic diastolic (congestive) heart failure: Secondary | ICD-10-CM

## 2023-03-17 DIAGNOSIS — R079 Chest pain, unspecified: Secondary | ICD-10-CM

## 2023-03-29 DIAGNOSIS — I5032 Chronic diastolic (congestive) heart failure: Secondary | ICD-10-CM | POA: Diagnosis not present

## 2023-03-29 DIAGNOSIS — I120 Hypertensive chronic kidney disease with stage 5 chronic kidney disease or end stage renal disease: Secondary | ICD-10-CM | POA: Diagnosis not present

## 2023-03-29 DIAGNOSIS — E875 Hyperkalemia: Secondary | ICD-10-CM | POA: Diagnosis not present

## 2023-03-29 DIAGNOSIS — N185 Chronic kidney disease, stage 5: Secondary | ICD-10-CM | POA: Diagnosis not present

## 2023-03-29 DIAGNOSIS — D631 Anemia in chronic kidney disease: Secondary | ICD-10-CM | POA: Diagnosis not present

## 2023-03-29 DIAGNOSIS — E1122 Type 2 diabetes mellitus with diabetic chronic kidney disease: Secondary | ICD-10-CM | POA: Diagnosis not present

## 2023-03-29 DIAGNOSIS — N2581 Secondary hyperparathyroidism of renal origin: Secondary | ICD-10-CM | POA: Diagnosis not present

## 2023-03-30 ENCOUNTER — Encounter (HOSPITAL_COMMUNITY): Payer: Self-pay

## 2023-03-30 ENCOUNTER — Ambulatory Visit (HOSPITAL_COMMUNITY)
Admission: RE | Admit: 2023-03-30 | Discharge: 2023-03-30 | Disposition: A | Discharge status: Home / Self Care | Payer: 59 | Source: Ambulatory Visit | Attending: Nephrology | Admitting: Nephrology

## 2023-03-30 VITALS — BP 164/75 | HR 60 | Temp 97.4°F | Resp 17

## 2023-03-30 DIAGNOSIS — N185 Chronic kidney disease, stage 5: Secondary | ICD-10-CM | POA: Diagnosis present

## 2023-03-30 LAB — IRON AND TIBC
Iron: 39 ug/dL — ABNORMAL LOW (ref 45–182)
Saturation Ratios: 15 % — ABNORMAL LOW (ref 17.9–39.5)
TIBC: 258 ug/dL (ref 250–450)
UIBC: 219 ug/dL

## 2023-03-30 LAB — POCT HEMOGLOBIN-HEMACUE: Hemoglobin: 9 g/dL — ABNORMAL LOW (ref 13.0–17.0)

## 2023-03-30 LAB — FERRITIN: Ferritin: 386 ng/mL — ABNORMAL HIGH (ref 24–336)

## 2023-03-30 MED ORDER — EPOETIN ALFA 20000 UNIT/ML IJ SOLN
20000.0000 [IU] | INTRAMUSCULAR | Status: DC
  Administered 2023-03-30: 20000 [IU] via SUBCUTANEOUS

## 2023-03-30 MED ORDER — EPOETIN ALFA 20000 UNIT/ML IJ SOLN
INTRAMUSCULAR | Status: AC
  Filled 2023-03-30: qty 1

## 2023-03-31 ENCOUNTER — Encounter (HOSPITAL_COMMUNITY): Payer: Self-pay

## 2023-04-03 ENCOUNTER — Encounter (HOSPITAL_COMMUNITY): Payer: Self-pay | Admitting: Gastroenterology

## 2023-04-03 ENCOUNTER — Ambulatory Visit: Payer: Managed Care, Other (non HMO) | Admitting: Podiatry

## 2023-04-03 ENCOUNTER — Encounter: Payer: Self-pay | Admitting: Podiatry

## 2023-04-03 ENCOUNTER — Other Ambulatory Visit: Payer: Self-pay

## 2023-04-03 DIAGNOSIS — E11621 Type 2 diabetes mellitus with foot ulcer: Secondary | ICD-10-CM | POA: Diagnosis not present

## 2023-04-03 DIAGNOSIS — L97422 Non-pressure chronic ulcer of left heel and midfoot with fat layer exposed: Secondary | ICD-10-CM

## 2023-04-03 MED ORDER — NA SULFATE-K SULFATE-MG SULF 17.5-3.13-1.6 GM/177ML PO SOLN: 1.0000 | Freq: Once | ORAL | 0 refills | Status: AC

## 2023-04-03 MED ORDER — CEPHALEXIN 500 MG PO CAPS: 500.0000 mg | ORAL_CAPSULE | Freq: Four times a day (QID) | ORAL | 0 refills | Status: AC

## 2023-04-03 NOTE — Progress Notes (Signed)
  Pre op call eval Name:Samuel Berline Lopes MD CardiologistMayford Knife MD  EKG-11/11/22 Echo-11/17/22 Cath-n/a Stress-n/a ICD/PM- n/a Blood thinner-n/a GLP-1-n/a  Hx: HTN, CKD5, Heart Failure, Aortic Dilation, Mitral Regurg., DM.Patient is on kidney transplant list and has AV access, saw nephrologist last week and they said his kidney function has decreased so they are referring him to start dialysis. He said they have not heard anything yet but are supposed to by tomorrow (1/21). Not having any cardiac,breathing, mobility issues.  Anesthesia Review: Yes

## 2023-04-03 NOTE — Progress Notes (Signed)
  Subjective:  Patient ID: Samuel Gardner, male    DOB: 03/08/1971,   MRN: [id number]  No chief complaint on file.   53 y.o. male presents for concern of blister on the left foot that developed a few weeks ago. He has not touched it but has been very painful. Here with partner who relates some history. Relates a spot that also popped up on his left great toe. He has a history of kidney failure and working on transplant. He relates wearing different stylish shoes.   Patient is diabetic and last A1c was  Lab Results  Component Value Date   HGBA1C 6.1 (H) 11/11/2022   .   PCP:  Rocky Morel, DO    . Denies any other pedal complaints. Denies n/v/f/c.   Past Medical History:  Diagnosis Date   (HFpEF) heart failure with preserved ejection fraction (HCC)    Ascending aorta dilatation (HCC)    40mm by echo 11/2022   CKD (chronic kidney disease), stage IV (HCC)    Diabetes mellitus without complication (HCC)    Hypertension    Mitral regurgitation    mild by echo 11/2022   Normocytic anemia     Objective:  Physical Exam: Vascular: DP/PT pulses 2/4 bilateral. CFT <3 seconds. Absent hair growth on digits. Edema noted to bilateral lower extremities. Xerosis noted bilaterally.  Skin. No lacerations or abrasions bilateral feet. Nails 1-5 bilateral  are normal in appearance. Blister noted to lateral left foot upon debridement underlying ulceration with granular base noted. Some purulent type tissue noted. Measures about 3 cm x 2 cm x 0.2 cm. No erythema edema or probe to bone.  Musculoskeletal: MMT 5/5 bilateral lower extremities in DF, PF, Inversion and Eversion. Deceased ROM in DF of ankle joint.  Neurological: Sensation intact to light touch. Protective sensation diminished bilateral.    Assessment:   1. Diabetic ulcer of left midfoot associated with type 2 diabetes mellitus, with fat layer exposed (HCC)      Plan:  Patient was evaluated and treated and all questions answered. Ulcer  lateral left foot with fat layer exposed  -Debridement as below. -Dressed with betadine, DSD. -Off-loading with surgical shoe. Dispensed.  -Keflex sent as precuation -Discussed glucose control and proper protein-rich diet.  -Discussed if any worsening redness, pain, fever or chills to call or may need to report to the emergency room. Patient expressed understanding.   Procedure: Excisional Debridement of Wound Rationale: Removal of non-viable soft tissue from the wound to promote healing.  Anesthesia: none Pre-Debridement Wound Measurements: Overlying blister  Post-Debridement Wound Measurements: 3 cm x 2 cm x 0.2 cm  Type of Debridement: Sharp Excisional Tissue Removed: Non-viable soft tissue Depth of Debridement: subcutaneous tissue. Technique: Sharp excisional debridement to bleeding, viable wound base.  Dressing: Dry, sterile, compression dressing. Disposition: Patient tolerated procedure well. Patient to return in 2 week for follow-up.  Return in about 2 weeks (around 04/17/2023) for wound check.    Louann Sjogren, DPM

## 2023-04-07 DIAGNOSIS — I132 Hypertensive heart and chronic kidney disease with heart failure and with stage 5 chronic kidney disease, or end stage renal disease: Secondary | ICD-10-CM | POA: Insufficient documentation

## 2023-04-07 DIAGNOSIS — F17210 Nicotine dependence, cigarettes, uncomplicated: Secondary | ICD-10-CM | POA: Insufficient documentation

## 2023-04-07 DIAGNOSIS — N2581 Secondary hyperparathyroidism of renal origin: Secondary | ICD-10-CM | POA: Insufficient documentation

## 2023-04-07 DIAGNOSIS — Z992 Dependence on renal dialysis: Secondary | ICD-10-CM | POA: Insufficient documentation

## 2023-04-11 ENCOUNTER — Ambulatory Visit (HOSPITAL_COMMUNITY)
Admission: RE | Admit: 2023-04-11 | Discharge: 2023-04-11 | Disposition: A | Discharge status: Home / Self Care | Payer: Managed Care, Other (non HMO) | Attending: Gastroenterology | Admitting: Gastroenterology

## 2023-04-11 ENCOUNTER — Ambulatory Visit (HOSPITAL_COMMUNITY): Payer: Managed Care, Other (non HMO) | Admitting: Anesthesiology

## 2023-04-11 ENCOUNTER — Encounter (HOSPITAL_COMMUNITY): Payer: Self-pay

## 2023-04-11 ENCOUNTER — Encounter (HOSPITAL_COMMUNITY)
Admission: RE | Disposition: A | Discharge status: Home / Self Care | Payer: Self-pay | Source: Home / Self Care | Attending: Gastroenterology

## 2023-04-11 ENCOUNTER — Encounter (HOSPITAL_COMMUNITY): Payer: Self-pay | Admitting: Gastroenterology

## 2023-04-11 ENCOUNTER — Other Ambulatory Visit: Payer: Self-pay

## 2023-04-11 DIAGNOSIS — E1122 Type 2 diabetes mellitus with diabetic chronic kidney disease: Secondary | ICD-10-CM | POA: Insufficient documentation

## 2023-04-11 DIAGNOSIS — D120 Benign neoplasm of cecum: Secondary | ICD-10-CM | POA: Insufficient documentation

## 2023-04-11 DIAGNOSIS — K635 Polyp of colon: Secondary | ICD-10-CM | POA: Diagnosis not present

## 2023-04-11 DIAGNOSIS — I5032 Chronic diastolic (congestive) heart failure: Secondary | ICD-10-CM | POA: Insufficient documentation

## 2023-04-11 DIAGNOSIS — K640 First degree hemorrhoids: Secondary | ICD-10-CM | POA: Insufficient documentation

## 2023-04-11 DIAGNOSIS — D122 Benign neoplasm of ascending colon: Secondary | ICD-10-CM | POA: Diagnosis not present

## 2023-04-11 DIAGNOSIS — I130 Hypertensive heart and chronic kidney disease with heart failure and stage 1 through stage 4 chronic kidney disease, or unspecified chronic kidney disease: Secondary | ICD-10-CM | POA: Diagnosis not present

## 2023-04-11 DIAGNOSIS — Z8249 Family history of ischemic heart disease and other diseases of the circulatory system: Secondary | ICD-10-CM | POA: Insufficient documentation

## 2023-04-11 DIAGNOSIS — Z0000 Encounter for general adult medical examination without abnormal findings: Secondary | ICD-10-CM

## 2023-04-11 DIAGNOSIS — Z1211 Encounter for screening for malignant neoplasm of colon: Secondary | ICD-10-CM

## 2023-04-11 DIAGNOSIS — T782XXA Anaphylactic shock, unspecified, initial encounter: Secondary | ICD-10-CM | POA: Insufficient documentation

## 2023-04-11 DIAGNOSIS — Z87891 Personal history of nicotine dependence: Secondary | ICD-10-CM | POA: Diagnosis not present

## 2023-04-11 DIAGNOSIS — Z833 Family history of diabetes mellitus: Secondary | ICD-10-CM | POA: Diagnosis not present

## 2023-04-11 DIAGNOSIS — Z79899 Other long term (current) drug therapy: Secondary | ICD-10-CM | POA: Diagnosis not present

## 2023-04-11 DIAGNOSIS — N184 Chronic kidney disease, stage 4 (severe): Secondary | ICD-10-CM | POA: Diagnosis not present

## 2023-04-11 DIAGNOSIS — D631 Anemia in chronic kidney disease: Secondary | ICD-10-CM | POA: Insufficient documentation

## 2023-04-11 DIAGNOSIS — T7840XA Allergy, unspecified, initial encounter: Secondary | ICD-10-CM | POA: Insufficient documentation

## 2023-04-11 HISTORY — PX: POLYPECTOMY: SHX5525

## 2023-04-11 HISTORY — PX: COLONOSCOPY WITH PROPOFOL: SHX5780

## 2023-04-11 LAB — POCT I-STAT, CHEM 8
BUN: 72 mg/dL — ABNORMAL HIGH (ref 6–20)
Calcium, Ion: 1.08 mmol/L — ABNORMAL LOW (ref 1.15–1.40)
Chloride: 110 mmol/L (ref 98–111)
Creatinine, Ser: 6.8 mg/dL — ABNORMAL HIGH (ref 0.61–1.24)
Glucose, Bld: 93 mg/dL (ref 70–99)
HCT: 28 % — ABNORMAL LOW (ref 39.0–52.0)
Hemoglobin: 9.5 g/dL — ABNORMAL LOW (ref 13.0–17.0)
Potassium: 4.8 mmol/L (ref 3.5–5.1)
Sodium: 140 mmol/L (ref 135–145)
TCO2: 17 mmol/L — ABNORMAL LOW (ref 22–32)

## 2023-04-11 LAB — GLUCOSE, CAPILLARY
Glucose-Capillary: 81 mg/dL (ref 70–99)
Glucose-Capillary: 77 mg/dL (ref 70–99)

## 2023-04-11 SURGERY — COLONOSCOPY WITH PROPOFOL
Anesthesia: Monitor Anesthesia Care

## 2023-04-11 MED ORDER — PROPOFOL 500 MG/50ML IV EMUL
INTRAVENOUS | Status: DC | PRN
  Administered 2023-04-11: 150 ug/kg/min via INTRAVENOUS

## 2023-04-11 MED ORDER — SODIUM CHLORIDE 0.9 % IV SOLN: INTRAVENOUS | Status: DC

## 2023-04-11 MED ORDER — SODIUM CHLORIDE 0.9 % IV SOLN: INTRAVENOUS | Status: DC | PRN

## 2023-04-11 MED ORDER — LIDOCAINE HCL (CARDIAC) PF 100 MG/5ML IV SOSY
PREFILLED_SYRINGE | INTRAVENOUS | Status: DC | PRN
  Administered 2023-04-11: 50 mg via INTRAVENOUS

## 2023-04-11 SURGICAL SUPPLY — 21 items

## 2023-04-11 NOTE — Anesthesia Preprocedure Evaluation (Addendum)
Anesthesia Evaluation  Patient identified by MRN, date of birth, ID band Patient awake    Reviewed: Allergy & Precautions, NPO status , Patient's Chart, lab work & pertinent test results  Airway Mallampati: III  TM Distance: >3 FB Neck ROM: Full    Dental  (+) Teeth Intact, Dental Advisory Given   Pulmonary former smoker   breath sounds clear to auscultation       Cardiovascular hypertension, Pt. on medications and Pt. on home beta blockers (-) dysrhythmias + Valvular Problems/Murmurs MR  Rhythm:Regular Rate:Normal  Echo:   1. Left ventricular ejection fraction, by estimation, is 60 to 65%. The  left ventricle has normal function. The left ventricle has no regional  wall motion abnormalities. There is moderate left ventricular hypertrophy.  Left ventricular diastolic  parameters are consistent with Grade I diastolic dysfunction (impaired  relaxation).   2. Right ventricular systolic function is normal. The right ventricular  size is normal.   3. Left atrial size was moderately dilated.   4. Right atrial size was mildly dilated.   5. The mitral valve is degenerative. Mild mitral valve regurgitation. No  evidence of mitral stenosis.   6. The aortic valve is tricuspid. There is mild calcification of the  aortic valve. Aortic valve regurgitation is not visualized. Aortic valve  sclerosis/calcification is present, without any evidence of aortic  stenosis.   7. Aortic dilatation noted. There is mild dilatation of the ascending  aorta, measuring 40 mm.   8. The inferior vena cava is normal in size with greater than 50%  respiratory variability, suggesting right atrial pressure of 3 mmHg.     Neuro/Psych negative neurological ROS  negative psych ROS   GI/Hepatic negative GI ROS, Neg liver ROS,,,  Endo/Other  diabetes    Renal/GU Renal disease     Musculoskeletal   Abdominal   Peds  Hematology  (+) Blood dyscrasia,  anemia   Anesthesia Other Findings   Reproductive/Obstetrics                             Anesthesia Physical Anesthesia Plan  ASA: 3  Anesthesia Plan: MAC   Post-op Pain Management: Minimal or no pain anticipated   Induction: Intravenous  PONV Risk Score and Plan: 0 and Propofol infusion  Airway Management Planned: Natural Airway and Nasal Cannula  Additional Equipment: None  Intra-op Plan:   Post-operative Plan:   Informed Consent: I have reviewed the patients History and Physical, chart, labs and discussed the procedure including the risks, benefits and alternatives for the proposed anesthesia with the patient or authorized representative who has indicated his/her understanding and acceptance.     Dental advisory given  Plan Discussed with: CRNA  Anesthesia Plan Comments:        Anesthesia Quick Evaluation

## 2023-04-11 NOTE — Discharge Instructions (Signed)
YOU HAD AN ENDOSCOPIC PROCEDURE TODAY: Refer to the procedure report and other information in the discharge instructions given to you for any specific questions about what was found during the examination. If this information does not answer your questions, please call [place] office at [phone] to clarify.   YOU SHOULD EXPECT: Some feelings of bloating in the abdomen. Passage of more gas than usual. Walking can help get rid of the air that was put into your GI tract during the procedure and reduce the bloating. If you had a lower endoscopy (such as a colonoscopy or flexible sigmoidoscopy) you may notice spotting of blood in your stool or on the toilet paper. Some abdominal soreness may be present for a day or two, also.  DIET: Your first meal following the procedure should be a light meal and then it is ok to progress to your normal diet. A half-sandwich or bowl of soup is an example of a good first meal. Heavy or fried foods are harder to digest and may make you feel nauseous or bloated. Drink plenty of fluids but you should avoid alcoholic beverages for 24 hours.   ACTIVITY: Your care partner should take you home directly after the procedure. You should plan to take it easy, moving slowly for the rest of the day. You can resume normal activity the day after the procedure however YOU SHOULD NOT DRIVE, use power tools, machinery or perform tasks that involve climbing or major physical exertion for 24 hours (because of the sedation medicines used during the test).   SYMPTOMS TO REPORT IMMEDIATELY: A gastroenterologist can be reached at any hour. Please call [phone]  for any of the following symptoms:  Following lower endoscopy (colonoscopy, flexible sigmoidoscopy) Excessive amounts of blood in the stool  Significant tenderness, worsening of abdominal pains  Swelling of the abdomen that is new, acute  Fever of 100 or higher    FOLLOW UP:  If any biopsies were taken you will be contacted by  phone or by letter within the next 1-3 weeks. Call [phone]  if you have not heard about the biopsies in 3 weeks.  Please also call with any specific questions about appointments or follow up tests.

## 2023-04-11 NOTE — Transfer of Care (Signed)
Immediate Anesthesia Transfer of Care Note  Patient: Samuel Gardner  Procedure(s) Performed: COLONOSCOPY WITH PROPOFOL POLYPECTOMY  Patient Location: PACU  Anesthesia Type:MAC  Level of Consciousness: awake and alert   Airway & Oxygen Therapy: Patient Spontanous Breathing and Patient connected to face mask oxygen  Post-op Assessment: Report given to RN and Post -op Vital signs reviewed and stable  Post vital signs: Reviewed and stable  Last Vitals:  Vitals Value Taken Time  BP 116/44 04/11/23 0940  Temp    Pulse    Resp 18 04/11/23 0940  SpO2    Vitals shown include unfiled device data.  Last Pain:  Vitals:   04/11/23 0737  TempSrc: Temporal  PainSc: 0-No pain         Complications: No notable events documented.

## 2023-04-11 NOTE — Op Note (Signed)
[facility] Patient Name: Samuel Gardner Procedure Date: 04/11/2023 MRN: [id number] Attending MD: Dub Amis. Tomasa Rand , MD, [id number] Date of Birth: [birth year] CSN: [id number] Age: 53 Admit Type: Outpatient Procedure:                Colonoscopy Indications:              Screening for colorectal malignant neoplasm, This                            is the patient's first colonoscopy Providers:                Lorin Picket E. Tomasa Rand, MD, Marge Duncans, RN, Rhodia Albright, Technician Referring MD:              Medicines:                Monitored Anesthesia Care Complications:            No immediate complications. Estimated Blood Loss:     Estimated blood loss was minimal. Procedure:                Pre-Anesthesia Assessment:                           - Prior to the procedure, a History and Physical                            was performed, and patient medications and                            allergies were reviewed. The patient's tolerance of                            previous anesthesia was also reviewed. The risks                            and benefits of the procedure and the sedation                            options and risks were discussed with the patient.                            All questions were answered, and informed consent                            was obtained. Prior Anticoagulants: The patient has                            taken no anticoagulant or antiplatelet agents. ASA                            Grade Assessment: III - A patient with severe  systemic disease. After reviewing the risks and                            benefits, the patient was deemed in satisfactory                            condition to undergo the procedure.                           After obtaining informed consent, the colonoscope                            was passed under direct vision. Throughout the                             procedure, the patient's blood pressure, pulse, and                            oxygen saturations were monitored continuously. The                            CF-HQ190L ([id number]) Olympus colonoscope was                            introduced through the anus and advanced to the the                            cecum, identified by appendiceal orifice and                            ileocecal valve. The colonoscopy was performed                            without difficulty. The patient tolerated the                            procedure well. The quality of the bowel                            preparation was adequate. The ileocecal valve,                            appendiceal orifice, and rectum were photographed.                            The bowel preparation used was SUPREP via split                            dose instruction. Scope In: 9:04:02 AM Scope Out: 9:32:31 AM Scope Withdrawal Time: 0 hours 22 minutes 41 seconds  Total Procedure Duration: 0 hours 28 minutes 29 seconds  Findings:      The perianal and digital rectal examinations were normal. Pertinent       negatives include normal sphincter tone and no palpable rectal lesions.  Two sessile polyps were found in the cecum. The polyps were 2 to 3 mm in       size. These polyps were removed with a cold snare. Resection and       retrieval were complete. Estimated blood loss was minimal.      A 10 mm polyp was found in the proximal ascending colon. The polyp was       pedunculated. The polyp was removed with a cold snare. Resection and       retrieval were complete. The polyp required bisection following       polypectomy to facilitate aspiration through the suction channel.       Estimated blood loss was minimal.      A 3 mm polyp was found in the sigmoid colon. The polyp was sessile. The       polyp was removed with a cold snare. Resection and retrieval were       complete. Estimated blood loss was minimal.      The exam was  otherwise normal throughout the examined colon.      Non-bleeding internal hemorrhoids were found during retroflexion. The       hemorrhoids were Grade I (internal hemorrhoids that do not prolapse).      No additional abnormalities were found on retroflexion. Impression:               - Two 2 to 3 mm polyps in the cecum, removed with a                            cold snare. Resected and retrieved.                           - One 10 mm polyp in the proximal ascending colon,                            removed with a cold snare. Resected and retrieved.                           - One 3 mm polyp in the sigmoid colon, removed with                            a cold snare. Resected and retrieved.                           - Non-bleeding internal hemorrhoids. Moderate Sedation:      Not Applicable - Patient had care per Anesthesia. Recommendation:           - Patient has a contact number available for                            emergencies. The signs and symptoms of potential                            delayed complications were discussed with the                            patient. Return to normal activities tomorrow.  Written discharge instructions were provided to the                            patient.                           - Resume previous diet.                           - Continue present medications.                           - Await pathology results.                           - Repeat colonoscopy (date not yet determined) for                            surveillance based on pathology results. Procedure Code(s):        --- Professional ---                           [phone], Colonoscopy, flexible; with removal of                            tumor(s), polyp(s), or other lesion(s) by snare                            technique Diagnosis Code(s):        --- Professional ---                           Z12.11, Encounter for screening for malignant                             neoplasm of colon                           D12.0, Benign neoplasm of cecum                           D12.2, Benign neoplasm of ascending colon                           D12.5, Benign neoplasm of sigmoid colon                           K64.0, First degree hemorrhoids CPT copyright 2022 [employer]. All rights reserved. The codes documented in this report are preliminary and upon coder review may  be revised to meet current compliance requirements. Braylen Staller E. Tomasa Rand, MD 04/11/2023 9:40:23 AM This report has been signed electronically. Number of Addenda: 0

## 2023-04-11 NOTE — Anesthesia Postprocedure Evaluation (Signed)
Anesthesia Post Note  Patient: Samuel Gardner  Procedure(s) Performed: COLONOSCOPY WITH PROPOFOL POLYPECTOMY     Patient location during evaluation: PACU Anesthesia Type: MAC Level of consciousness: awake and alert Pain management: pain level controlled Vital Signs Assessment: post-procedure vital signs reviewed and stable Respiratory status: spontaneous breathing, nonlabored ventilation, respiratory function stable and patient connected to nasal cannula oxygen Cardiovascular status: stable and blood pressure returned to baseline Postop Assessment: no apparent nausea or vomiting Anesthetic complications: no  No notable events documented.  Last Vitals:  Vitals:   04/11/23 0950 04/11/23 1000  BP: (!) 143/65 (!) 174/69  Pulse: (!) 56 (!) 57  Resp: 14 17  Temp:    SpO2: 97% 98%    Last Pain:  Vitals:   04/11/23 1000  TempSrc:   PainSc: 0-No pain                 Shelton Silvas

## 2023-04-11 NOTE — H&P (Signed)
[facility] History and Physical   Primary Care Physician:  Rocky Morel, DO   Reason for Procedure:   Colon cancer screening  Plan:    Screening colonoscopy      HPI: Samuel Gardner is a 53 y.o. male undergoing initial average risk screening colonoscopy.  He has no family history of colon cancer and no chronic GI symptoms.    Past Medical History:  Diagnosis Date   (HFpEF) heart failure with preserved ejection fraction (HCC)    Ascending aorta dilatation (HCC)    40mm by echo 11/2022   CKD (chronic kidney disease), stage IV (HCC)    Diabetes mellitus without complication (HCC)    Hypertension    Mitral regurgitation    mild by echo 11/2022   Normocytic anemia     Past Surgical History:  Procedure Laterality Date   AV FISTULA PLACEMENT Left 12/13/2022   Procedure: LEFT ARM BRACHIOCEPHALIC ARTERIOVENOUS (AV) FISTULA CREATION;  Surgeon: Victorino Sparrow, MD;  Location: [facility] OR;  Service: Vascular;  Laterality: Left;   LEG SURGERY Right    teenager    Prior to Admission medications   Medication Sig Start Date End Date Taking? Authorizing Provider  amLODipine (NORVASC) 5 MG tablet Take 1 tablet (5 mg total) by mouth daily. 05/23/22  Yes [name], [name], MD  atorvastatin (LIPITOR) 80 MG tablet Take 1 tablet (80 mg total) by mouth daily. 12/23/22 04/11/23 Yes [name], Cornelious Bryant, MD  calcitRIOL (ROCALTROL) 0.25 MCG capsule Take 0.25 mcg by mouth 3 (three) times a week. 01/22/23  Yes [provider]  carvedilol (COREG) 25 MG tablet TAKE 1 TABLET 2 TIMES DAILY 03/17/23  Yes [name], Cornelious Bryant, MD  cholecalciferol (VITAMIN D3) 25 MCG (1000 UNIT) tablet Take 1,000 Units by mouth daily.   Yes [provider]  ferrous sulfate 325 (65 FE) MG EC tablet Take 325 mg by mouth in the morning and at bedtime.   Yes [provider]  furosemide (LASIX) 80 MG tablet Take 1 tablet (80 mg total) by mouth 2 (two) times daily. 11/13/22  Yes Rocky Morel, DO   hydrALAZINE (APRESOLINE) 100 MG tablet Take 1 tablet (100 mg total) by mouth 3 (three) times daily. 11/09/22  Yes [name], Cornelious Bryant, MD  isosorbide mononitrate (IMDUR) 30 MG 24 hr tablet Take 3 tablets (90 mg total) by mouth daily. 05/19/22  Yes [name], [name], MD  KIONEX 15 GM/60ML suspension Take 15 g by mouth 2 (two) times a week. 11/07/22  Yes [provider]  metolazone (ZAROXOLYN) 5 MG tablet Take 5 mg by mouth once a week. 02/15/23  Yes [provider]  oxyCODONE-acetaminophen (PERCOCET) 5-325 MG tablet Take 1 tablet by mouth every 6 (six) hours as needed for severe pain. 12/13/22 12/13/23 Yes Baglia, Corrina, [name]  polyethylene glycol powder (GAVILAX) 17 GM/SCOOP powder Take 17 g by mouth daily as needed for mild constipation. 11/12/22  Yes Rocky Morel, DO  albuterol (VENTOLIN HFA) 108 (90 Base) MCG/ACT inhaler Inhale 2 puffs into the lungs every 6 (six) hours as needed for wheezing or shortness of breath. 02/02/22   Leatha Gilding, MD  nitroGLYCERIN (NITROSTAT) 0.4 MG SL tablet Place 1 tablet (0.4 mg total) under the tongue every 5 (five) minutes as needed for chest pain. 02/07/22   Sharlene Dory, [name]    Current Facility-Administered Medications  Medication Dose Route Frequency Provider Last Rate Last Admin   0.9 %  sodium chloride infusion   Intravenous Continuous Hyacinth Meeker  Orlie Pollen, [place]        Allergies as of 02/16/2023   (No Known Allergies)    Family History  Adopted: Yes  Problem Relation Age of Onset   Diabetes Mother    Hypertension Mother    Kidney disease Mother    Cancer Father    Diabetes Sister    Hypertension Sister     Social History   Socioeconomic History   Marital status: Married    Spouse name: Not on file   Number of children: Not on file   Years of education: Not on file   Highest education level: Not on file  Occupational History   Not on file  Tobacco Use   Smoking status: Former    Current packs/day: 0.00     Types: Cigarettes    Quit date: 01/30/2022    Years since quitting: 1.1   Smokeless tobacco: Never  Vaping Use   Vaping status: Never Used  Substance and Sexual Activity   Alcohol use: Not Currently   Drug use: No   Sexual activity: Not on file  Other Topics Concern   Not on file  Social History Narrative   Not on file   Social Drivers of Health   Financial Resource Strain: Not on file  Food Insecurity: No Food Insecurity (12/05/2022)   Hunger Vital Sign    Worried About Running Out of Food in the Last Year: Never true    Ran Out of Food in the Last Year: Never true  Transportation Needs: No Transportation Needs (12/05/2022)   PRAPARE - Administrator, Civil Service (Medical): No    Lack of Transportation (Non-Medical): No  Physical Activity: Not on file  Stress: Not on file  Social Connections: Moderately Isolated (02/23/2022)   Social Connection and Isolation Panel [NHANES]    Frequency of Communication with Friends and Family: More than three times a week    Frequency of Social Gatherings with Friends and Family: More than three times a week    Attends Religious Services: Never    Database administrator or Organizations: No    Attends Banker Meetings: Never    Marital Status: Married  Catering manager Violence: Not At Risk (12/05/2022)   Humiliation, Afraid, Rape, and Kick questionnaire    Fear of Current or Ex-Partner: No    Emotionally Abused: No    Physically Abused: No    Sexually Abused: No    Review of Systems:  All other review of systems negative except as mentioned in the HPI.  Physical Exam: Vital signs BP (!) 167/71   Pulse (!) 59   Temp 97.8 F (36.6 C) (Temporal)   Resp 13   Ht 5\' 7"  (1.702 m)   Wt 78.5 kg   SpO2 98%   BMI 27.10 kg/m   General:   Alert,  Well-developed, well-nourished, pleasant and cooperative in NAD Airway:  Mallampati 3 Lungs:  Clear throughout to auscultation.   Heart:  Regular rate and rhythm;  no murmurs, clicks, rubs,  or gallops. Abdomen:  Soft, nontender and nondistended. Normal bowel sounds.   Neuro/Psych:  Normal mood and affect. A and O x 3   Lazaro Isenhower E. Tomasa Rand, MD [facility]

## 2023-04-12 LAB — SURGICAL PATHOLOGY

## 2023-04-12 NOTE — Progress Notes (Signed)
Cardiology Office Note:  .   Date:  04/25/2023  ID:  [name], DOB 09/01/1970, MRN [id number] PCP: Rocky Morel, DO  [facility] Providers Cardiologist:  Armanda Magic, MD    History of Present Illness: BRENTT FREAD is a 53 y.o. male  with a hx significant for hypertension, diabetes mellitus, chronic HFpEF, CKD and COPD.    2D echo 01/31/2022 done for mildly elevated troponin in the setting of hypertensive urgency showed an LVEF 55 to 60% with grade 1 DD.  Ischemic work-up was not pursued inpatient since his chest pain was thought to be due to to demand ischemia in the setting of hypertension.  His Imdur was increased to 60 mg daily and amlodipine decreased to 5 mg daily due to bilateral pitting lower extremity edema.  He was referred to nephrology due to his AKI on CKD.  At last renal OV he was told that he will likely be moving towards HD soon and went over the types of dialysis.   Patient comes in for f/u. Last week Vascular Surgery performed an abdominal aortogram with balloon lithotripsy on left leg. A1c (5.4% 03/2023) and LDL this admission was 93. Now on Plavix, ASA and crestor. Now on HD. No chest pain, dyspnea, edema. Starting to walk some in the house and stretches since vascular surgery.  ROS:    Studies Reviewed: Marland Kitchen         Prior CV Studies:    Echo 11/2022 IMPRESSIONS     1. Left ventricular ejection fraction, by estimation, is 60 to 65%. The  left ventricle has normal function. The left ventricle has no regional  wall motion abnormalities. There is moderate left ventricular hypertrophy.  Left ventricular diastolic  parameters are consistent with Grade I diastolic dysfunction (impaired  relaxation).   2. Right ventricular systolic function is normal. The right ventricular  size is normal.   3. Left atrial size was moderately dilated.   4. Right atrial size was mildly dilated.   5. The mitral valve is degenerative. Mild mitral valve  regurgitation. No  evidence of mitral stenosis.   6. The aortic valve is tricuspid. There is mild calcification of the  aortic valve. Aortic valve regurgitation is not visualized. Aortic valve  sclerosis/calcification is present, without any evidence of aortic  stenosis.   7. Aortic dilatation noted. There is mild dilatation of the ascending  aorta, measuring 40 mm.   8. The inferior vena cava is normal in size with greater than 50%  respiratory variability, suggesting right atrial pressure of 3 mmHg.    Risk Assessment/Calculations:             Physical Exam:   VS:  BP 118/70 (BP Location: Right Arm, Patient Position: Sitting, Cuff Size: Normal)   Pulse 65   Resp 16   Ht 5\' 7"  (1.702 m)   Wt 187 lb 9.6 oz (85.1 kg)   SpO2 95%   BMI 29.38 kg/m    Wt Readings from Last 3 Encounters:  04/25/23 187 lb 9.6 oz (85.1 kg)  04/19/23 174 lb 9.7 oz (79.2 kg)  04/11/23 173 lb (78.5 kg)    GEN: Well nourished, well developed in no acute distress NECK: No JVD; No carotid bruits CARDIAC:  RRR, no murmurs, rubs, gallops RESPIRATORY:  Clear to auscultation without rales, wheezing or rhonchi  ABDOMEN: Soft, non-tender, non-distended EXTREMITIES:  No edema; No deformity   ASSESSMENT AND PLAN: .    Chronic diastolic  CHF -EF 60 to 65% by echo 11/2022  -now on HD and Nephrology manages his diuretics given his CKD -Cannot use SGLT2i due to CKD -compenstaed    Elevated troponin/chest pain/coronary artery calcifications -Thought to be due to demand ischemia from poorly controlled hypertension -EF 60-65% echo 11/2022 -Stress PET CT done 05/17/2022 demonstrated no ischemia but reduced global blood flow reserve concerning for possible severe multivessel CAD causing balanced ischemia versus microvascular disease.  There was no evidence of TID or drop of EF with stress so suspected that this was likely microvascular disease.  He only had mild coronary artery calcifications on CT at that time.   Medical management was recommended. -no chest pain   Hypertension controlled -No ACE/ARB/ARNI/MRA due to CKD   Hyperlipidemia -LDL goal less than 70 due to coronary calcifications -cramps on atorvastatin 40 mg daily and now on crestor. LDL 93 04/20/23 Recheck in 3 months.   CKD stage 5 -now on HD -being worked up at [facility] for kidney transplant  PAD  Vascular Surgery performed an abdominal aortogram with balloon lithotripsy on left leg. A1c (5.4% 03/2023) now on plavix/ASA/Crestor        Dispo: f/u in 6 months  Signed, Jacolyn Reedy, [name]

## 2023-04-13 ENCOUNTER — Encounter (HOSPITAL_COMMUNITY): Payer: Self-pay | Admitting: Gastroenterology

## 2023-04-13 ENCOUNTER — Encounter (HOSPITAL_COMMUNITY): Payer: 59

## 2023-04-14 ENCOUNTER — Encounter: Payer: Self-pay | Admitting: Gastroenterology

## 2023-04-14 ENCOUNTER — Ambulatory Visit (HOSPITAL_COMMUNITY): Payer: 59

## 2023-04-14 NOTE — Progress Notes (Signed)
Samuel Gardner,  3 of the 4 polyps that I removed during your recent procedure were completely benign but were proven to be "pre-cancerous" polyps that MAY have grown into cancers if they had not been removed.  One of the polyps was not precancerous.  Studies shows that at least 20% of women over age 53 and 30% of men over age 9 have pre-cancerous polyps.  Based on current nationally recognized surveillance guidelines, I recommend that you have a repeat colonoscopy in 3 years.   If you develop any new rectal bleeding, abdominal pain or significant bowel habit changes, please contact me before then.

## 2023-04-17 ENCOUNTER — Encounter (HOSPITAL_COMMUNITY): Payer: Self-pay

## 2023-04-17 ENCOUNTER — Inpatient Hospital Stay (HOSPITAL_COMMUNITY)
Admission: EM | Admit: 2023-04-17 | Discharge: 2023-04-20 | DRG: 278 | Disposition: A | Discharge status: Home / Self Care | Payer: Managed Care, Other (non HMO) | Attending: Internal Medicine | Admitting: Internal Medicine

## 2023-04-17 ENCOUNTER — Ambulatory Visit: Payer: Managed Care, Other (non HMO) | Admitting: Podiatry

## 2023-04-17 ENCOUNTER — Ambulatory Visit (HOSPITAL_COMMUNITY)
Admission: EM | Admit: 2023-04-17 | Discharge: 2023-04-17 | Disposition: A | Discharge status: Home / Self Care | Payer: Managed Care, Other (non HMO)

## 2023-04-17 ENCOUNTER — Encounter (HOSPITAL_COMMUNITY): Payer: Self-pay | Admitting: Emergency Medicine

## 2023-04-17 DIAGNOSIS — Z841 Family history of disorders of kidney and ureter: Secondary | ICD-10-CM

## 2023-04-17 DIAGNOSIS — Z789 Other specified health status: Principal | ICD-10-CM

## 2023-04-17 DIAGNOSIS — X58XXXA Exposure to other specified factors, initial encounter: Secondary | ICD-10-CM | POA: Insufficient documentation

## 2023-04-17 DIAGNOSIS — D631 Anemia in chronic kidney disease: Secondary | ICD-10-CM | POA: Diagnosis present

## 2023-04-17 DIAGNOSIS — E1122 Type 2 diabetes mellitus with diabetic chronic kidney disease: Secondary | ICD-10-CM | POA: Insufficient documentation

## 2023-04-17 DIAGNOSIS — I5030 Unspecified diastolic (congestive) heart failure: Secondary | ICD-10-CM | POA: Insufficient documentation

## 2023-04-17 DIAGNOSIS — I70244 Atherosclerosis of native arteries of left leg with ulceration of heel and midfoot: Secondary | ICD-10-CM | POA: Diagnosis present

## 2023-04-17 DIAGNOSIS — S5012XA Contusion of left forearm, initial encounter: Secondary | ICD-10-CM | POA: Insufficient documentation

## 2023-04-17 DIAGNOSIS — Z833 Family history of diabetes mellitus: Secondary | ICD-10-CM

## 2023-04-17 DIAGNOSIS — E1151 Type 2 diabetes mellitus with diabetic peripheral angiopathy without gangrene: Secondary | ICD-10-CM | POA: Diagnosis not present

## 2023-04-17 DIAGNOSIS — I70262 Atherosclerosis of native arteries of extremities with gangrene, left leg: Principal | ICD-10-CM | POA: Insufficient documentation

## 2023-04-17 DIAGNOSIS — N2581 Secondary hyperparathyroidism of renal origin: Secondary | ICD-10-CM | POA: Diagnosis present

## 2023-04-17 DIAGNOSIS — L97509 Non-pressure chronic ulcer of other part of unspecified foot with unspecified severity: Secondary | ICD-10-CM | POA: Diagnosis present

## 2023-04-17 DIAGNOSIS — I5032 Chronic diastolic (congestive) heart failure: Secondary | ICD-10-CM | POA: Diagnosis present

## 2023-04-17 DIAGNOSIS — L97929 Non-pressure chronic ulcer of unspecified part of left lower leg with unspecified severity: Secondary | ICD-10-CM | POA: Diagnosis present

## 2023-04-17 DIAGNOSIS — E785 Hyperlipidemia, unspecified: Secondary | ICD-10-CM | POA: Insufficient documentation

## 2023-04-17 DIAGNOSIS — E11621 Type 2 diabetes mellitus with foot ulcer: Secondary | ICD-10-CM | POA: Diagnosis present

## 2023-04-17 DIAGNOSIS — N186 End stage renal disease: Secondary | ICD-10-CM | POA: Insufficient documentation

## 2023-04-17 DIAGNOSIS — D638 Anemia in other chronic diseases classified elsewhere: Secondary | ICD-10-CM | POA: Insufficient documentation

## 2023-04-17 DIAGNOSIS — R58 Hemorrhage, not elsewhere classified: Secondary | ICD-10-CM | POA: Diagnosis not present

## 2023-04-17 DIAGNOSIS — Z87891 Personal history of nicotine dependence: Secondary | ICD-10-CM | POA: Insufficient documentation

## 2023-04-17 DIAGNOSIS — I739 Peripheral vascular disease, unspecified: Secondary | ICD-10-CM

## 2023-04-17 DIAGNOSIS — Z992 Dependence on renal dialysis: Secondary | ICD-10-CM | POA: Insufficient documentation

## 2023-04-17 DIAGNOSIS — I132 Hypertensive heart and chronic kidney disease with heart failure and with stage 5 chronic kidney disease, or end stage renal disease: Secondary | ICD-10-CM | POA: Insufficient documentation

## 2023-04-17 DIAGNOSIS — L97429 Non-pressure chronic ulcer of left heel and midfoot with unspecified severity: Secondary | ICD-10-CM | POA: Diagnosis present

## 2023-04-17 DIAGNOSIS — Z79899 Other long term (current) drug therapy: Secondary | ICD-10-CM | POA: Insufficient documentation

## 2023-04-17 DIAGNOSIS — Z8249 Family history of ischemic heart disease and other diseases of the circulatory system: Secondary | ICD-10-CM

## 2023-04-17 LAB — BASIC METABOLIC PANEL
Anion gap: 12 (ref 5–15)
BUN: 32 mg/dL — ABNORMAL HIGH (ref 6–20)
CO2: 29 mmol/L (ref 22–32)
Calcium: 8.2 mg/dL — ABNORMAL LOW (ref 8.9–10.3)
Chloride: 101 mmol/L (ref 98–111)
Creatinine, Ser: 3.74 mg/dL — ABNORMAL HIGH (ref 0.61–1.24)
GFR, Estimated: 18 mL/min — ABNORMAL LOW (ref >60)
Glucose, Bld: 127 mg/dL — ABNORMAL HIGH (ref 70–99)
Potassium: 4.2 mmol/L (ref 3.5–5.1)
Sodium: 142 mmol/L (ref 135–145)

## 2023-04-17 LAB — CBC WITH DIFFERENTIAL/PLATELET
Abs Immature Granulocytes: 0.02 10*3/uL (ref 0.00–0.07)
Basophils Absolute: 0.1 10*3/uL (ref 0.0–0.1)
Basophils Relative: 1 %
Eosinophils Absolute: 0.2 10*3/uL (ref 0.0–0.5)
Eosinophils Relative: 2 %
HCT: 27.6 % — ABNORMAL LOW (ref 39.0–52.0)
Hemoglobin: 8.7 g/dL — ABNORMAL LOW (ref 13.0–17.0)
Immature Granulocytes: 0 %
Lymphocytes Relative: 16 %
Lymphs Abs: 1.2 10*3/uL (ref 0.7–4.0)
MCH: 28.2 pg (ref 26.0–34.0)
MCHC: 31.5 g/dL (ref 30.0–36.0)
MCV: 89.3 fL (ref 80.0–100.0)
Monocytes Absolute: 0.9 10*3/uL (ref 0.1–1.0)
Monocytes Relative: 13 %
Neutro Abs: 5 10*3/uL (ref 1.7–7.7)
Neutrophils Relative %: 68 %
Platelets: 213 10*3/uL (ref 150–400)
RBC: 3.09 MIL/uL — ABNORMAL LOW (ref 4.22–5.81)
RDW: 13.8 % (ref 11.5–15.5)
WBC: 7.3 10*3/uL (ref 4.0–10.5)
nRBC: 0 % (ref 0.0–0.2)

## 2023-04-17 LAB — PROTIME-INR
INR: 1 (ref 0.8–1.2)
Prothrombin Time: 13.8 s (ref 11.4–15.2)

## 2023-04-17 NOTE — ED Provider Triage Note (Cosign Needed Addendum)
Emergency Medicine Provider Triage Evaluation Note  SHAAN RHOADS , a 53 y.o. male  was evaluated in triage.  Pt complains of redness and swelling around fistula site. Had 3rd dialysis treatment today.   Review of Systems  Positive: Left arm swelling Negative:   Physical Exam  BP (!) 185/84   Pulse 70   Temp 98.3 F (36.8 C) (Oral)   Resp 18   SpO2 98%  Gen:   Awake, no distress   Resp:  Normal effort  MSK:   Moves extremities without difficulty  Other:    Medical Decision Making  Medically screening exam initiated at 8:01 PM.  Appropriate orders placed.  [name] was informed that the remainder of the evaluation will be completed by another provider, this initial triage assessment does not replace that evaluation, and the importance of remaining in the ED until their evaluation is complete.  Will order labs + US arterial left upper ext to r/o pseudoaneursym   Mckensi Redinger T, [name] 04/17/23 2001   2153 -- Ultrasound tech attempted to page vascular US tech. No one is on call for vascular ultrasounds after 7pm for the rest of the evening.    Su Monks, Cordelia Poche 04/17/23 2153

## 2023-04-17 NOTE — ED Triage Notes (Addendum)
PT had 3rd dialysis today. Pt noticing redness and swelling around site fistula in left upper arm. Thrill present and good. Redness to skin noted to surrounding tissue and soft swelling noted. Pt having numbness and pain at site. Peripheral pulses intact. No other issues.

## 2023-04-17 NOTE — ED Provider Notes (Signed)
Patient seen briefly in triage.  He had dialysis today and finished it up about 330.  About 2 hours later he took off his compression bandage that he usually wears over the fistula and noted that there was ecchymosis and swelling of the lower portion of his left upper arm.   No fever or chills  His fistula has a thrill just below it.  There is ecchymosis and swelling of the lower third of the left upper arm.  No warmth.  He has a Band-Aid now over the access site which was apparently leaking a little bit still when he took off the compression bandage  He has not taken his amlodipine today.  Blood pressure is 198/89.  Heart rate 83.  I have asked him to go to the emergency room for further evaluation and treatment.  He will go by private car   Zenia Resides, MD 04/17/23 [id number]

## 2023-04-18 ENCOUNTER — Ambulatory Visit: Payer: Managed Care, Other (non HMO) | Admitting: Podiatry

## 2023-04-18 ENCOUNTER — Observation Stay (HOSPITAL_COMMUNITY): Payer: Managed Care, Other (non HMO)

## 2023-04-18 ENCOUNTER — Emergency Department (HOSPITAL_BASED_OUTPATIENT_CLINIC_OR_DEPARTMENT_OTHER): Payer: Managed Care, Other (non HMO)

## 2023-04-18 ENCOUNTER — Encounter (HOSPITAL_COMMUNITY): Payer: Managed Care, Other (non HMO)

## 2023-04-18 ENCOUNTER — Ambulatory Visit (HOSPITAL_COMMUNITY)
Admission: RE | Admit: 2023-04-18 | Discharge: 2023-04-18 | Disposition: A | Discharge status: Home / Self Care | Payer: Managed Care, Other (non HMO) | Source: Ambulatory Visit | Attending: Podiatry | Admitting: Podiatry

## 2023-04-18 ENCOUNTER — Other Ambulatory Visit: Payer: Self-pay

## 2023-04-18 DIAGNOSIS — N186 End stage renal disease: Secondary | ICD-10-CM

## 2023-04-18 DIAGNOSIS — E1151 Type 2 diabetes mellitus with diabetic peripheral angiopathy without gangrene: Secondary | ICD-10-CM | POA: Diagnosis present

## 2023-04-18 DIAGNOSIS — Z87891 Personal history of nicotine dependence: Secondary | ICD-10-CM | POA: Diagnosis not present

## 2023-04-18 DIAGNOSIS — Z992 Dependence on renal dialysis: Secondary | ICD-10-CM

## 2023-04-18 DIAGNOSIS — I739 Peripheral vascular disease, unspecified: Secondary | ICD-10-CM | POA: Diagnosis not present

## 2023-04-18 DIAGNOSIS — I132 Hypertensive heart and chronic kidney disease with heart failure and with stage 5 chronic kidney disease, or end stage renal disease: Secondary | ICD-10-CM | POA: Diagnosis present

## 2023-04-18 DIAGNOSIS — L97909 Non-pressure chronic ulcer of unspecified part of unspecified lower leg with unspecified severity: Secondary | ICD-10-CM

## 2023-04-18 DIAGNOSIS — L97509 Non-pressure chronic ulcer of other part of unspecified foot with unspecified severity: Secondary | ICD-10-CM | POA: Diagnosis present

## 2023-04-18 DIAGNOSIS — Z79899 Other long term (current) drug therapy: Secondary | ICD-10-CM | POA: Diagnosis not present

## 2023-04-18 DIAGNOSIS — Z841 Family history of disorders of kidney and ureter: Secondary | ICD-10-CM | POA: Diagnosis not present

## 2023-04-18 DIAGNOSIS — Z833 Family history of diabetes mellitus: Secondary | ICD-10-CM | POA: Diagnosis not present

## 2023-04-18 DIAGNOSIS — E785 Hyperlipidemia, unspecified: Secondary | ICD-10-CM | POA: Diagnosis present

## 2023-04-18 DIAGNOSIS — E11621 Type 2 diabetes mellitus with foot ulcer: Secondary | ICD-10-CM | POA: Diagnosis present

## 2023-04-18 DIAGNOSIS — N2581 Secondary hyperparathyroidism of renal origin: Secondary | ICD-10-CM | POA: Diagnosis present

## 2023-04-18 DIAGNOSIS — E1122 Type 2 diabetes mellitus with diabetic chronic kidney disease: Secondary | ICD-10-CM | POA: Diagnosis present

## 2023-04-18 DIAGNOSIS — D631 Anemia in chronic kidney disease: Secondary | ICD-10-CM | POA: Diagnosis present

## 2023-04-18 DIAGNOSIS — L97529 Non-pressure chronic ulcer of other part of left foot with unspecified severity: Secondary | ICD-10-CM | POA: Diagnosis not present

## 2023-04-18 DIAGNOSIS — T82898A Other specified complication of vascular prosthetic devices, implants and grafts, initial encounter: Secondary | ICD-10-CM | POA: Diagnosis not present

## 2023-04-18 DIAGNOSIS — R58 Hemorrhage, not elsewhere classified: Secondary | ICD-10-CM | POA: Diagnosis not present

## 2023-04-18 DIAGNOSIS — I5032 Chronic diastolic (congestive) heart failure: Secondary | ICD-10-CM | POA: Diagnosis present

## 2023-04-18 DIAGNOSIS — I70244 Atherosclerosis of native arteries of left leg with ulceration of heel and midfoot: Secondary | ICD-10-CM | POA: Diagnosis present

## 2023-04-18 DIAGNOSIS — L97429 Non-pressure chronic ulcer of left heel and midfoot with unspecified severity: Secondary | ICD-10-CM | POA: Diagnosis present

## 2023-04-18 DIAGNOSIS — Z8249 Family history of ischemic heart disease and other diseases of the circulatory system: Secondary | ICD-10-CM | POA: Diagnosis not present

## 2023-04-18 LAB — ALBUMIN: Albumin: 2.9 g/dL — ABNORMAL LOW (ref 3.5–5.0)

## 2023-04-18 LAB — VAS US ABI WITH/WO TBI: Left ABI: 0.97

## 2023-04-18 LAB — PHOSPHORUS: Phosphorus: 4 mg/dL (ref 2.5–4.6)

## 2023-04-18 LAB — C-REACTIVE PROTEIN: CRP: 0.7 mg/dL (ref <1.0)

## 2023-04-18 LAB — MRSA NEXT GEN BY PCR, NASAL: MRSA by PCR Next Gen: NOT DETECTED

## 2023-04-18 LAB — HIV ANTIBODY (ROUTINE TESTING W REFLEX): HIV Screen 4th Generation wRfx: NONREACTIVE

## 2023-04-18 LAB — SEDIMENTATION RATE: Sed Rate: 39 mm/h — ABNORMAL HIGH (ref 0–16)

## 2023-04-18 LAB — HEPATITIS B SURFACE ANTIGEN: Hepatitis B Surface Ag: NONREACTIVE

## 2023-04-18 MED ORDER — HEPARIN SODIUM (PORCINE) 5000 UNIT/ML IJ SOLN
5000.0000 [IU] | Freq: Three times a day (TID) | INTRAMUSCULAR | Status: DC
  Administered 2023-04-18 – 2023-04-20 (×3): 5000 [IU] via SUBCUTANEOUS
  Filled 2023-04-18 (×2): qty 1

## 2023-04-18 MED ORDER — ATORVASTATIN CALCIUM 40 MG PO TABS: 80.0000 mg | ORAL_TABLET | Freq: Every day | ORAL | Status: DC

## 2023-04-18 MED ORDER — ISOSORBIDE MONONITRATE ER 60 MG PO TB24
90.0000 mg | ORAL_TABLET | Freq: Every day | ORAL | Status: DC
  Administered 2023-04-18 – 2023-04-19 (×2): 90 mg via ORAL
  Filled 2023-04-18 (×2): qty 1

## 2023-04-18 MED ORDER — AMLODIPINE BESYLATE 5 MG PO TABS
5.0000 mg | ORAL_TABLET | Freq: Once | ORAL | Status: AC
  Administered 2023-04-18: 5 mg via ORAL
  Filled 2023-04-18: qty 1

## 2023-04-18 MED ORDER — CARVEDILOL 25 MG PO TABS
25.0000 mg | ORAL_TABLET | Freq: Two times a day (BID) | ORAL | Status: DC
  Administered 2023-04-18 – 2023-04-20 (×3): 25 mg via ORAL
  Filled 2023-04-18 (×3): qty 1

## 2023-04-18 MED ORDER — CARVEDILOL 12.5 MG PO TABS
25.0000 mg | ORAL_TABLET | Freq: Once | ORAL | Status: AC
  Administered 2023-04-18: 25 mg via ORAL
  Filled 2023-04-18: qty 2

## 2023-04-18 MED ORDER — HYDRALAZINE HCL 50 MG PO TABS
100.0000 mg | ORAL_TABLET | Freq: Once | ORAL | Status: AC
  Administered 2023-04-18: 100 mg via ORAL
  Filled 2023-04-18: qty 2

## 2023-04-18 MED ORDER — CHLORHEXIDINE GLUCONATE CLOTH 2 % EX PADS
6.0000 | MEDICATED_PAD | Freq: Every day | CUTANEOUS | Status: DC
  Administered 2023-04-20: 6 via TOPICAL

## 2023-04-18 MED ORDER — FUROSEMIDE 20 MG PO TABS
80.0000 mg | ORAL_TABLET | Freq: Once | ORAL | Status: AC
  Administered 2023-04-18: 80 mg via ORAL
  Filled 2023-04-18: qty 4

## 2023-04-18 MED ORDER — ALBUTEROL SULFATE (2.5 MG/3ML) 0.083% IN NEBU: 3.0000 mL | INHALATION_SOLUTION | Freq: Four times a day (QID) | RESPIRATORY_TRACT | Status: DC | PRN

## 2023-04-18 NOTE — ED Provider Notes (Signed)
 [place] EMERGENCY DEPARTMENT AT [facility] Provider Note   CSN: [id number] Arrival date & time: 04/17/23  1812     History  Chief Complaint  Patient presents with   Vascular Access Problem    Samuel Gardner is a 53 y.o. male.  53 year old male presents today for concern of vascular access issue.  He recently had a left upper extremity fistula placed.  He just received his third dialysis session which she was able to complete a full session of.  He states after he got home he took off the dressing is significantly swelled up, he also has bruising and pain.  No other complaints.        Home Medications Prior to Admission medications   Medication Sig Start Date End Date Taking? Authorizing Provider  albuterol  (VENTOLIN  HFA) 108 (90 Base) MCG/ACT inhaler Inhale 2 puffs into the lungs every 6 (six) hours as needed for wheezing or shortness of breath. 02/02/22   [name], [name], MD  amLODipine  (NORVASC ) 5 MG tablet Take 1 tablet (5 mg total) by mouth daily. 05/23/22   [name], [name], MD  atorvastatin  (LIPITOR) 80 MG tablet Take 1 tablet (80 mg total) by mouth daily. 12/23/22 04/11/23  [name], MD  calcitRIOL (ROCALTROL) 0.25 MCG capsule Take 0.25 mcg by mouth 3 (three) times a week. 01/22/23   [provider]  carvedilol  (COREG ) 25 MG tablet TAKE 1 TABLET 2 TIMES DAILY 03/17/23   [name], MD  cholecalciferol (VITAMIN D3) 25 MCG (1000 UNIT) tablet Take 1,000 Units by mouth daily.    [provider]  ferrous sulfate 325 (65 FE) MG EC tablet Take 325 mg by mouth in the morning and at bedtime.    [provider]  furosemide  (LASIX ) 80 MG tablet Take 1 tablet (80 mg total) by mouth 2 (two) times daily. 11/13/22   [name], DO  hydrALAZINE  (APRESOLINE ) 100 MG tablet Take 1 tablet (100 mg total) by mouth 3 (three) times daily. 11/09/22   [name], MD  isosorbide  mononitrate (IMDUR ) 30 MG 24 hr tablet Take 3 tablets (90  mg total) by mouth daily. 05/19/22   Santo Stanly LABOR, MD  KIONEX  15 GM/60ML suspension Take 15 g by mouth 2 (two) times a week. 11/07/22   [provider]  metolazone (ZAROXOLYN) 5 MG tablet Take 5 mg by mouth once a week. 02/15/23   [provider]  nitroGLYCERIN  (NITROSTAT ) 0.4 MG SL tablet Place 1 tablet (0.4 mg total) under the tongue every 5 (five) minutes as needed for chest pain. 02/07/22   [name], [name], [name]  oxyCODONE -acetaminophen  (PERCOCET) 5-325 MG tablet Take 1 tablet by mouth every 6 (six) hours as needed for severe pain. 12/13/22 12/13/23  Baglia, Corrina, [name]  polyethylene glycol powder (GAVILAX) 17 GM/SCOOP powder Take 17 g by mouth daily as needed for mild constipation. 11/12/22   [name], DO      Allergies    Patient has no known allergies.    Review of Systems   Review of Systems  Constitutional:  Negative for chills and fever.  Respiratory:  Negative for shortness of breath.   All other systems reviewed and are negative.   Physical Exam Updated Vital Signs BP (!) 171/79 (BP Location: Left Arm)   Pulse 64   Temp 99 F (37.2 C) (Oral)   Resp 16   SpO2 97%  Physical Exam Vitals and nursing note reviewed.  Constitutional:  General: He is not in acute distress.    Appearance: Normal appearance. He is not ill-appearing.  HENT:     Head: Normocephalic and atraumatic.     Nose: Nose normal.  Eyes:     Conjunctiva/sclera: Conjunctivae normal.  Cardiovascular:     Rate and Rhythm: Normal rate and regular rhythm.  Pulmonary:     Effort: Pulmonary effort is normal. No respiratory distress.  Musculoskeletal:        General: No deformity. Normal range of motion.     Cervical back: Normal range of motion.     Comments: Left upper extremity with visible swelling, ecchymosis.  Neurovascularly intact.  Palpable thrill present over the fistula.  Skin:    Findings: No rash.  Neurological:     Mental Status: He is alert.     ED  Results / Procedures / Treatments   Labs (all labs ordered are listed, but only abnormal results are displayed) Labs Reviewed  CBC WITH DIFFERENTIAL/PLATELET - Abnormal; Notable for the following components:      Result Value   RBC 3.09 (*)    Hemoglobin 8.7 (*)    HCT 27.6 (*)    All other components within normal limits  BASIC METABOLIC PANEL - Abnormal; Notable for the following components:   Glucose, Bld 127 (*)    BUN 32 (*)    Creatinine, Ser 3.74 (*)    Calcium  8.2 (*)    GFR, Estimated 18 (*)    All other components within normal limits  PROTIME-INR    EKG None  Radiology No results found.  Procedures Procedures    Medications Ordered in ED Medications - No data to display  ED Course/ Medical Decision Making/ A&P                                 Medical Decision Making Risk Prescription drug management.   Medical Decision Making / ED Course   This patient presents to the ED for concern of left arm pain and swelling, this involves an extensive number of treatment options, and is a complaint that carries with it a high risk of complications and morbidity.  The differential diagnosis includes DVT, pseudoaneurysm, infiltration,  MDM: 53 year old male presents today for concern of left arm pain and swelling.  Recently had a fistula placed.  I discussed the case with vascular surgery.  They will evaluate patient.  Ultrasound was ordered overnight.  Currently pending.  Vascular surgery evaluated patient.  They state the fistula looks okay less likely infiltration.  They are concerned about his foot wound.  He was supposed to be seen in the office as well as follow-up with podiatry.  They added all the ABI study for this.  And will follow-up.  Concern for decreased perfusion to the lower extremities.  Vascular surgery placed admit patient for angiogram.  They would like medicine team to admit.  I sent a secure chat to notify podiatry of admission and  consult.   Lab Tests: -I ordered, reviewed, and interpreted labs.   The pertinent results include:   Labs Reviewed  CBC WITH DIFFERENTIAL/PLATELET - Abnormal; Notable for the following components:      Result Value   RBC 3.09 (*)    Hemoglobin 8.7 (*)    HCT 27.6 (*)    All other components within normal limits  BASIC METABOLIC PANEL - Abnormal; Notable for the following components:   Glucose,  Bld 127 (*)    BUN 32 (*)    Creatinine, Ser 3.74 (*)    Calcium  8.2 (*)    GFR, Estimated 18 (*)    All other components within normal limits  PROTIME-INR      EKG  EKG Interpretation Date/Time:    Ventricular Rate:    PR Interval:    QRS Duration:    QT Interval:    QTC Calculation:   R Axis:      Text Interpretation:           Imaging Studies ordered: I ordered imaging studies including vascular US  study I independently visualized and interpreted imaging. I agree with the radiologist interpretation   Medicines ordered and prescription drug management: No orders of the defined types were placed in this encounter.   -I have reviewed the patients home medicines and have made adjustments as needed  Critical interventions Vascular surgery consult  Consultations Obtained: I requested consultation with the vascular surg, nephrology, and podiatry,  and discussed lab and imaging findings as well as pertinent plan - they recommend: as above   Reevaluation: After the interventions noted above, I reevaluated the patient and found that they have :stayed the same  Co morbidities that complicate the patient evaluation  Past Medical History:  Diagnosis Date   (HFpEF) heart failure with preserved ejection fraction (HCC)    Ascending aorta dilatation (HCC)    40mm by echo 11/2022   CKD (chronic kidney disease), stage IV (HCC)    Diabetes mellitus without complication (HCC)    Hypertension    Mitral regurgitation    mild by echo 11/2022   Normocytic anemia        Dispostion: Discussed with internal medicine service.  They will evaluate patient for admission.   Final Clinical Impression(s) / ED Diagnoses Final diagnoses:  None    Rx / DC Orders ED Discharge Orders     None         Hildegard Loge, [name] 04/18/23 1420    Ruthe Cornet, DO 04/18/23 1439

## 2023-04-18 NOTE — Consult Note (Addendum)
 Renal Service Consult Note [facility]  Samuel Gardner 04/18/2023 Samuel JONETTA Fret, MD Requesting Physician: Dr. Eben  Reason for Consult: ESRD pt w/ L diabetic foot ulcer HPI: The patient is a 52 y.o. year-old w/ PMH as below who presented to ED c/o bruising over his HD access and also an ulcer not getting better at the left midfoot. In ED BP's were a bit high, HR 70, RR 18, afebrile. Labs showed K 4.2, creat 3.7, BUN 32.  Hb 8.7.  Pt was admitted. Vasc surgery and podiatry are also following. MRI pending, also ESR/ CRP. We are asked to see for dialysis.   Pt seen in room. No c/o other than L arm pain and swelling. Just started HD last week, yesterday was his 3rd dialysis session.  L AVF was created in Oct 2024, and was ready for use in January. 1st use was last week.    ROS - denies CP, no joint pain, no HA, no blurry vision, no rash, no diarrhea, no nausea/ vomiting, no dysuria, no difficulty voiding   Past Medical History  Past Medical History:  Diagnosis Date   (HFpEF) heart failure with preserved ejection fraction (HCC)    Ascending aorta dilatation (HCC)    40mm by echo 11/2022   CKD (chronic kidney disease), stage IV (HCC)    Diabetes mellitus without complication (HCC)    Hypertension    Mitral regurgitation    mild by echo 11/2022   Normocytic anemia    Past Surgical History  Past Surgical History:  Procedure Laterality Date   AV FISTULA PLACEMENT Left 12/13/2022   Procedure: LEFT ARM BRACHIOCEPHALIC ARTERIOVENOUS (AV) FISTULA CREATION;  Surgeon: [name], MD;  Location: [facility] OR;  Service: Vascular;  Laterality: Left;   COLONOSCOPY WITH PROPOFOL  N/A 04/11/2023   Procedure: COLONOSCOPY WITH PROPOFOL ;  Surgeon: [name], MD;  Location: [facility];  Service: Gastroenterology;  Laterality: N/A;   LEG SURGERY Right    teenager   POLYPECTOMY  04/11/2023   Procedure: POLYPECTOMY;  Surgeon: [name], MD;  Location: [facility];   Service: Gastroenterology;;   Family History  Family History  Adopted: Yes  Problem Relation Age of Onset   Diabetes Mother    Hypertension Mother    Kidney disease Mother    Cancer Father    Diabetes Sister    Hypertension Sister    Social History  reports that he quit smoking about 14 months ago. His smoking use included cigarettes. He has never used smokeless tobacco. He reports that he does not currently use alcohol. He reports that he does not use drugs. Allergies  Allergies  Allergen Reactions   Atorvastatin      Muscle cramping   Home medications Prior to Admission medications   Medication Sig Start Date End Date Taking? Authorizing Provider  acetaminophen  (TYLENOL ) 500 MG tablet Take 1,000 mg by mouth every 6 (six) hours as needed for mild pain (pain score 1-3) or headache.   Yes [provider]  albuterol  (VENTOLIN  HFA) 108 (90 Base) MCG/ACT inhaler Inhale 2 puffs into the lungs every 6 (six) hours as needed for wheezing or shortness of breath. 02/02/22  Yes [name], [name], MD  amLODipine  (NORVASC ) 5 MG tablet Take 1 tablet (5 mg total) by mouth daily. 05/23/22  Yes [name], [name], MD  carvedilol  (COREG ) 25 MG tablet TAKE 1 TABLET 2 TIMES DAILY 03/17/23  Yes [name], [name], MD  furosemide  (LASIX ) 80 MG tablet Take 1  tablet (80 mg total) by mouth 2 (two) times daily. 11/13/22  Yes [name], DO  hydrALAZINE  (APRESOLINE ) 100 MG tablet Take 1 tablet (100 mg total) by mouth 3 (three) times daily. 11/09/22  Yes [name], [name], MD  isosorbide  mononitrate (IMDUR ) 30 MG 24 hr tablet Take 3 tablets (90 mg total) by mouth daily. Patient taking differently: Take 90 mg by mouth at bedtime. 05/19/22  Yes [name], [name], MD  nitroGLYCERIN  (NITROSTAT ) 0.4 MG SL tablet Place 1 tablet (0.4 mg total) under the tongue every 5 (five) minutes as needed for chest pain. 02/07/22  Yes [name], [name], [name]  polyethylene glycol powder (GAVILAX) 17 GM/SCOOP powder Take 17 g  by mouth daily as needed for mild constipation. 11/12/22  Yes [name], DO  atorvastatin  (LIPITOR) 80 MG tablet Take 1 tablet (80 mg total) by mouth daily. Patient not taking: Reported on 04/18/2023 12/23/22 04/11/23  [name], MD  calcitRIOL (ROCALTROL) 0.25 MCG capsule Take 0.25 mcg by mouth 3 (three) times a week. 01/22/23   [provider]  cholecalciferol (VITAMIN D3) 25 MCG (1000 UNIT) tablet Take 1,000 Units by mouth daily.    [provider]  ferrous sulfate 325 (65 FE) MG EC tablet Take 325 mg by mouth in the morning and at bedtime.    [provider]  KIONEX  15 GM/60ML suspension Take 15 g by mouth 2 (two) times a week. 11/07/22   [provider]  metolazone (ZAROXOLYN) 5 MG tablet Take 5 mg by mouth once a week. 02/15/23   [provider]     Vitals:   04/18/23 0214 04/18/23 0620 04/18/23 1038 04/18/23 1339  BP: (!) 165/71 (!) 171/79 (!) 177/82 (!) 180/80  Pulse: 60 64 67 65  Resp: 18 16 16 14   Temp: 98.4 F (36.9 C) 99 F (37.2 C) 98.5 F (36.9 C) 99.7 F (37.6 C)  TempSrc: Oral Oral Oral Oral  SpO2: 97% 97% 98% 100%   Exam Gen alert, no distress No rash, cyanosis or gangrene Sclera anicteric, throat clear  No jvd or bruits Chest clear bilat to bases, no rales/ wheezing RRR no MRG Abd soft ntnd no mass or ascites +bs GU defer MS - 2+ LUA edema and ecchymosis  Ext no LE or UE edema, no other edema Neuro is alert, Ox 3 , nf    LUA AVF+bruit       Renal-related home meds: - norvasc  5 - rocaltrol 0.25 three times per week - coreg  25 bid - lasix  80 daily - hydralazine  100mg  tid - imdur  90 hs - metolazone 5mg  once weekly    [facility]    4h  B400   82kg  2/2.5 bath  AVF  Heparin  none - last HD 2/03, post wt 85.7kg (+3.7kg) - venofer 100mg  q hd thru 2/24 - mircera 75 mcg IV q 4 wks, last 2/03 given yesterday    Assessment/ Plan: L midfoot diabetic foot ulcer - MRI pending, per pmd  L arm  ecchymosis - sp infiltration yest 2/03, this was only his 3rd HD session HTN - BP's a bit high 150- 180 sbp. Cont home meds, get vol down w/ HD. No edema ESRD - on HD MWF. Next HD tomorrow.  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 Just got esa on 2/03, not due for 4 wks. Follow.  Secondary hyperparathyroidism - Ca in range, add on phos/ alb. Cont binders w/ meals  Myer Fret  MD [facility] 04/18/2023, 4:19 PM  Recent Labs  Lab 04/17/23 1939  HGB 8.7*  CALCIUM  8.2*  CREATININE 3.74*  K 4.2   Inpatient medications:  heparin   5,000 Units Subcutaneous Q8H

## 2023-04-18 NOTE — ED Notes (Signed)
 Pt transported to vascular.

## 2023-04-18 NOTE — Plan of Care (Signed)

## 2023-04-18 NOTE — Progress Notes (Signed)
Ankle-brachial index completed. Please see CV Procedures for preliminary results.  Shona Simpson, RVT 04/18/23 12:47 PM
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
## 2023-04-18 NOTE — H&P (Addendum)
 Date: 04/18/2023               Patient Name:  Samuel Gardner MRN: [id number]  DOB: October 04, 1970 Age / Sex: 53 y.o., male   PCP: [name], DO         Medical Service: Internal Medicine Teaching Service         Attending Physician: Dr. Eben Reyes BROCKS, MD      First Contact: Dr. Norman Lobstein, DO Pager [phone]    Second Contact: Dr. Hadassah Ala, MD Pager [phone]         After Hours (After 5p/  First Contact Pager: [phone]  weekends / holidays): Second Contact Pager: [phone]   SUBJECTIVE   Chief Complaint: Left arm pain/bruising/swelling  History of Present Illness: Lovie L. Brockmann is a 53 year old male with past medical history of ESRD on HD @ [facility] ([address] MWF, HFpEF (60-65% 11/2022), T2DM, and HTN who presents with one day history of left arm pain with associated ecchymosis/swelling. Patient recently started HD and was attending 3rd session on Monday when he noticed bruising on left arm around AVF site that evening after removing bandage. Also endorsed moderate pain/swelling at that time. Since onset, pain has improved but bruising/swelling seem to be worse. Has taken Tylenol  for pain which has helped. Although not part of original chief complaint, patient also presents with left inferolateral midfoot wound. First noticed wound ~ 1 month ago. Denies trauma. This was diagnosed as a diabetic ulcer with exposed fat pad and debrided at 1/20 office visit with Podiatry. Patient was given course of Keflex  which he finished and was to return for follow-up today. Also has small ulcer of right great toe that was seen during 1/20 appointment, but no intervention at that time. Neither ulcer has worsened in terms of pain or size but both have not improved since debridement. Patient denies fever, chills, HA, weakness, chest pain, SOB, abdominal pain, dysuria, melena/hematochezia, numbness/tinging in extremities.   Medications:  Current Outpatient Medications  Medication  Instructions   acetaminophen  (TYLENOL ) 1,000 mg, Every 6 hours PRN   albuterol  (VENTOLIN  HFA) 108 (90 Base) MCG/ACT inhaler 2 puffs, Inhalation, Every 6 hours PRN   amLODipine  (NORVASC ) 5 mg, Oral, Daily   atorvastatin  (LIPITOR) 80 mg, Oral, Daily   calcitRIOL (ROCALTROL) 0.25 mcg, Oral, 3 times weekly   carvedilol  (COREG ) 25 MG tablet TAKE 1 TABLET 2 TIMES DAILY   cholecalciferol (VITAMIN D3) 1,000 Units, Oral, Daily   ferrous sulfate 325 mg, Oral, 2 times daily   furosemide  (LASIX ) 80 mg, Oral, 2 times daily   hydrALAZINE  (APRESOLINE ) 100 mg, Oral, 3 times daily   isosorbide  mononitrate (IMDUR ) 90 mg, Oral, Daily   Kionex  15 g, Oral, 2 times weekly   metolazone (ZAROXOLYN) 5 mg, Oral, Weekly   nitroGLYCERIN  (NITROSTAT ) 0.4 mg, Sublingual, Every 5 min PRN   polyethylene glycol powder (GAVILAX) 17 g, Oral, Daily PRN    Past Medical History Past Medical History:  Diagnosis Date   (HFpEF) heart failure with preserved ejection fraction (HCC)    Ascending aorta dilatation (HCC)    40mm by echo 11/2022   CKD (chronic kidney disease), stage IV (HCC)    Diabetes mellitus without complication (HCC)    Hypertension    Mitral regurgitation    mild by echo 11/2022   Normocytic anemia     Past Surgical History Past Surgical History:  Procedure Laterality Date   AV FISTULA PLACEMENT Left 12/13/2022   Procedure: LEFT ARM BRACHIOCEPHALIC ARTERIOVENOUS (  AV) FISTULA CREATION;  Surgeon: [name], MD;  Location: [facility] OR;  Service: Vascular;  Laterality: Left;   COLONOSCOPY WITH PROPOFOL  N/A 04/11/2023   Procedure: COLONOSCOPY WITH PROPOFOL ;  Surgeon: [name], MD;  Location: [facility];  Service: Gastroenterology;  Laterality: N/A;   LEG SURGERY Right    teenager   POLYPECTOMY  04/11/2023   Procedure: POLYPECTOMY;  Surgeon: [name], MD;  Location: [facility];  Service: Gastroenterology;;    Social:  Lives With: Wife Towana) and daughter Level of Function:  Independent PCP: Dr. Fairy Pool Substances: Denies current tobacco, alcohol, drug use (quit smoking 01/2022 with 20-pack-year history)  Family History:  Family History  Adopted: Yes  Problem Relation Age of Onset   Diabetes Mother    Hypertension Mother    Kidney disease Mother    Cancer Father    Diabetes Sister    Hypertension Sister     Allergies: Allergies as of 04/17/2023   (No Known Allergies)    Review of Systems: A complete ROS was negative except as per HPI.   OBJECTIVE:   Physical Exam: Blood pressure (!) 180/80, pulse 65, temperature 99.7 F (37.6 C), temperature source Oral, resp. rate 14, SpO2 100%.   Constitutional: well-appearing, lying in bed, in no acute distress Cardiovascular: regular rate and rhythm, no m/r/g, no LEE, no appreciable DP/PT pulses but both appreciated on doppler Pulmonary/Chest: normal work of breathing on room air, lungs clear to auscultation bilaterally Abdominal: soft, non-tender, non-distended MSK: normal bulk and tone Skin: left inferolateral midfoot ulcer with fat pad exposed (no central TTP but has surrounding TTP), left anterior forearm with ecchymosis/swelling (palpable thrill appreciated at AFV site, mild TTP), right great toe with 1 cm ulcer (all wounds without active drainage) Neurological: sensation in all distal extremities     Labs: CBC    Component Value Date/Time   WBC 7.3 04/17/2023 1939   RBC 3.09 (L) 04/17/2023 1939   HGB 8.7 (L) 04/17/2023 1939   HGB 10.2 (L) 12/05/2022 1633   HCT 27.6 (L) 04/17/2023 1939   HCT 31.7 (L) 12/05/2022 1633   PLT 213 04/17/2023 1939   PLT 301 12/05/2022 1633   MCV 89.3 04/17/2023 1939   MCV 85 12/05/2022 1633   MCH 28.2 04/17/2023 1939   MCHC 31.5 04/17/2023 1939   RDW 13.8 04/17/2023 1939   RDW 13.9 12/05/2022 1633   LYMPHSABS 1.2 04/17/2023 1939   LYMPHSABS 1.4 02/22/2022 1617   MONOABS 0.9 04/17/2023 1939   EOSABS 0.2 04/17/2023 1939   EOSABS 0.3 02/22/2022 1617    BASOSABS 0.1 04/17/2023 1939   BASOSABS 0.1 02/22/2022 1617     CMP     Component Value Date/Time   NA 142 04/17/2023 1939   NA 142 12/05/2022 1633   K 4.2 04/17/2023 1939   CL 101 04/17/2023 1939   CO2 29 04/17/2023 1939   GLUCOSE 127 (H) 04/17/2023 1939   BUN 32 (H) 04/17/2023 1939   BUN 56 (H) 12/05/2022 1633   CREATININE 3.74 (H) 04/17/2023 1939   CALCIUM  8.2 (L) 04/17/2023 1939   PROT 5.3 (L) 11/10/2022 1757   ALBUMIN 2.2 (L) 11/12/2022 0341   AST 17 11/10/2022 1757   ALT 22 02/06/2023 1335   ALKPHOS 74 11/10/2022 1757   BILITOT 0.5 11/10/2022 1757   GFRNONAA 18 (L) 04/17/2023 1939   GFRAA >60 07/24/2016 1115    ASSESSMENT & PLAN:   Assessment & Plan by Problem: Principal Problem:   Nonhealing  ulcer of left lower extremity (HCC)   ARIC JOST is a 53 y.o.male with PMH ESRD on HD @ [facility] ([address] MWF, HFpEF (60-65% 11/2022), T2DM, HTN, HLD, and suspected COPD (no PFT's on file) who presents with one day history of left arm pain with associated ecchymosis/swelling and admitted for non-healing diabetic foot ulcer of left midfoot.   Nonhealing Ulcer of Left Midfoot Vascular Surgery and Podiatry following - appreciate recommendations. ABI's today limited in setting of arterial wall calcification but notes: non-compressible right lower extremity arteries with abnormal right and left toe-brachial index.  Doppler waveforms at left ankle suggestive of arterial occlusive disease. A1c and LDL 03/2023 were 5.4 and 94. Afebrile, no leukocytosis. Wound is non-draining and central part of ulcer covered with tissue consistent with beginning stages of eschar. Vascular proceeding with angiogram tomorrow. Podiatry will follow-up. - Order MRI Left Foot to evaluate for osteomyelitis  - Check ESR/CRP - Trend CBC/CMP - Follow-up Vascular/Podiatry recommendations  ESRD Left Forearm Ecchymosis  Anemia of Chronic Disease Vascular evaluated and attributed presentation with  infiltration and believes it can most likely still be used for HD. AVF with palpable thrill. Low concern for DVT. US  UE ordered in ED to evaluate for pseudoaneurysm, pending.  Nephrology consulted for inpatient HD. Electrolytes unremarkable on BMP. Hgb 8.7 which is stable from previous labs. Denies signs of blood loss. Iron panel 03/2023 showed Ferritin of 386 and TSAT of 15 (below goal of 20 in ESRD). Patient was scheduled to get po Iron and ESA with HD. Screening colonoscopy 1/28 showed 1 pre-cancerous and 3 benign polyps that were removed. -Follow-up US  and Angiogram  HTN Presented with BP 180/80  - had not taken BP meds since yesterday morning. Denies symptoms. -Resume home Amlodipine  5 mg/daily, Imdur  90 mg/daily (Holding home Coreg  25 mg BID and Hydralazine  100 mg TID) -CTM   HLD LDL 03/2023 was 94. Patient prescribed Lipitor but has not been taking it 2/2 cramping. Will further clarify this with him in AM.  HFpEF No signs of volume overload. -Resume home Lasix  80 mg/daily -Trend CMP  T2DM Well controlled without medications.  -Trend CGM  Suspected COPD No PFT's on file, but it appears this test has been ordered. Saturating 100% on RA. -Resume home Ventolin  prn  Diet: Renal VTE: Heparin  Code: Full  Dispo: Admit patient to Observation with expected length of stay less than 2 midnights.  Signed: Marylu Gee, DO Internal Medicine Resident PGY-1 04/18/2023, 3:22 PM

## 2023-04-18 NOTE — Hospital Course (Addendum)
 Principal Problem:   Ulcer of foot (HCC) Active Problems:   ESRD (end stage renal disease) (HCC)   PAD (peripheral artery disease) (HCC)  Procedures: Abdominal Aortogram with Lower Extremity, Inpatient Hemodialysis   Consults: Vascular Surgery, Podiatry, Nephrology  Follow-Up Recommendations: - PCP to follow up on HTN and TII DM management and COPD PFT - Nephrology to follow with ESRD and HD - Podiatry to follow up on nonhealing L foot ulcer and wound care - Vascular follow up on PAD and aortogram post-op   Samuel Gardner is a 53 y.o. male with past medical history of ESRD on HD @ [facility] MWF, HFpEF (60-65% 11/2022), T2DM, and HTN who presented with one day history of left arm pain with associated ecchymosis/swelling and L inferolateral midfoot wound and admitted for non-healing diabetic foot ulcer of left midfoot.   ESRD Left Forearm Ecchymosis  Anemia of Chronic Disease Patient presented with moderate pain, swelling and bruising one day after receiving dialysis. His AV fistula was created on 12/13/22. Vascular evaluated and attributed presentation with infiltration. Patient received dialysis through the fistula without complications during admission. AVF continued to have palpable thrill prior to and after receiving dialysis. Patient's Hgb was stable ranging between 8.2-8.9. Nephrology consulted for inpatient HD. They reported he received ESA on 2/03 and is not due for 4 weeks. Screening colonoscopy 1/28 showed 1 pre-cancerous and 3 benign polyps that were removed. Recommend continued follow up with nephrology for ESRD and HD.  PAD Nonhealing Ulcer of Left Midfoot On admission, patient was found to have a L inferolateral midfoot wound although this was not the patient's chief complaint. He follows with podiatry who diagnosed this ulcer as a diabetic ulcer with exposed fat pad and debrided at 1/20. On exam, wound was non-draining and central part of ulcer covered with tissue  consistent with beginning stages of eschar. Vascular Surgery evaluated. ABI's on admission limited in setting of arterial wall calcification but results noted non-compressible right lower extremity arteries with abnormal right and left toe-brachial index.  Doppler waveforms at left ankle suggestive of arterial occlusive disease. Given these results, Vascular Surgery performed an abdominal aortogram with balloon lithotripsy on left leg. A1c (5.4% 03/2023) and LDL this admission was 93. Patient remained afebrile with no leukocytosis during admission. Podiatry evaluated and recommended wound care with betadine and DSD daily to wound and will follow-up outpatient in 1-2 weeks. Vascular Surgery to contact patient about scheduling appointment for PAD and post-operative follow up. Patient discharged on ASA and Plavix  per Vascular team.   HTN Hypertensive on admission from 150's-180's. Nephrology recommended restarting home meds of Amlodipine  5 mg/daily, Imdur  90 mg/daily, Coreg  25 mg BID and Hydralazine  100 mg TID. BP improved during admission and with HD. Normotensive at discharge. Patient continued on these medications at discharge. Recommend follow up with PCP on HTN management.  HLD LDL this admission was 93. Patient previously prescribed Lipitor but stopped taking it 2/2 cramping. Started 10 mg Crestor  as it is hypothesized that Crestor  may be less likely to cause muscular side effects. Patient discharged on 20 mg given no reports of muscle cramping with 10 mg. Patient advised to follow with PCP for HLD medication management. If patient experiences muscle cramps, may consider decreasing Crestor  to 10 mg and adding Zetia.   HFpEF Home lasix  80 mg was given for one dose on admission. Held additional doses as patient was scheduled to receive dialysis. No signs of volume overload at discharge. Home lasix  80 mg daily  restarted on discharge.   T2DM A1c 5.4% 03/2023. Well controlled without medications. Recommend  outpatient follow up with PCP.  Suspected COPD No PFT's on file, but it appears this test has been ordered. Patient resumed on home albuterol  PRN. Oxygen saturations stable during admission and on discharge. Recommend PCP to follow up on PFT.

## 2023-04-19 ENCOUNTER — Inpatient Hospital Stay (HOSPITAL_COMMUNITY)
Admission: EM | Disposition: A | Discharge status: Home / Self Care | Payer: Self-pay | Source: Home / Self Care | Attending: Internal Medicine

## 2023-04-19 DIAGNOSIS — Z992 Dependence on renal dialysis: Secondary | ICD-10-CM | POA: Diagnosis not present

## 2023-04-19 DIAGNOSIS — I739 Peripheral vascular disease, unspecified: Secondary | ICD-10-CM | POA: Diagnosis not present

## 2023-04-19 DIAGNOSIS — I70244 Atherosclerosis of native arteries of left leg with ulceration of heel and midfoot: Secondary | ICD-10-CM | POA: Diagnosis not present

## 2023-04-19 DIAGNOSIS — L97529 Non-pressure chronic ulcer of other part of left foot with unspecified severity: Secondary | ICD-10-CM

## 2023-04-19 DIAGNOSIS — N186 End stage renal disease: Secondary | ICD-10-CM | POA: Diagnosis not present

## 2023-04-19 HISTORY — PX: PERIPHERAL VASCULAR BALLOON ANGIOPLASTY: CATH118281

## 2023-04-19 HISTORY — PX: ABDOMINAL AORTOGRAM W/LOWER EXTREMITY: CATH118223

## 2023-04-19 HISTORY — PX: PERIPHERAL INTRAVASCULAR LITHOTRIPSY: CATH118324

## 2023-04-19 LAB — CBC
HCT: 26.5 % — ABNORMAL LOW (ref 39.0–52.0)
Hemoglobin: 8.2 g/dL — ABNORMAL LOW (ref 13.0–17.0)
MCH: 27.9 pg (ref 26.0–34.0)
MCHC: 30.9 g/dL (ref 30.0–36.0)
MCV: 90.1 fL (ref 80.0–100.0)
Platelets: 188 10*3/uL (ref 150–400)
RBC: 2.94 MIL/uL — ABNORMAL LOW (ref 4.22–5.81)
RDW: 13.6 % (ref 11.5–15.5)
WBC: 6.2 10*3/uL (ref 4.0–10.5)
nRBC: 0 % (ref 0.0–0.2)

## 2023-04-19 LAB — COMPREHENSIVE METABOLIC PANEL
ALT: 13 U/L (ref 0–44)
AST: 12 U/L — ABNORMAL LOW (ref 15–41)
Albumin: 2.7 g/dL — ABNORMAL LOW (ref 3.5–5.0)
Alkaline Phosphatase: 69 U/L (ref 38–126)
Anion gap: 9 (ref 5–15)
BUN: 40 mg/dL — ABNORMAL HIGH (ref 6–20)
CO2: 26 mmol/L (ref 22–32)
Calcium: 8.1 mg/dL — ABNORMAL LOW (ref 8.9–10.3)
Chloride: 102 mmol/L (ref 98–111)
Creatinine, Ser: 5.29 mg/dL — ABNORMAL HIGH (ref 0.61–1.24)
GFR, Estimated: 12 mL/min — ABNORMAL LOW (ref >60)
Glucose, Bld: 113 mg/dL — ABNORMAL HIGH (ref 70–99)
Potassium: 5.1 mmol/L (ref 3.5–5.1)
Sodium: 137 mmol/L (ref 135–145)
Total Bilirubin: 0.3 mg/dL (ref 0.0–1.2)
Total Protein: 5.8 g/dL — ABNORMAL LOW (ref 6.5–8.1)

## 2023-04-19 LAB — GLUCOSE, CAPILLARY: Glucose-Capillary: 89 mg/dL (ref 70–99)

## 2023-04-19 LAB — HEPATITIS B SURFACE ANTIBODY, QUANTITATIVE: Hep B S AB Quant (Post): <3.5 m[IU]/mL — ABNORMAL LOW

## 2023-04-19 SURGERY — ABDOMINAL AORTOGRAM W/LOWER EXTREMITY
Anesthesia: LOCAL | Laterality: Left

## 2023-04-19 MED ORDER — SODIUM CHLORIDE 0.9% FLUSH
3.0000 mL | Freq: Two times a day (BID) | INTRAVENOUS | Status: DC
  Administered 2023-04-20: 3 mL via INTRAVENOUS

## 2023-04-19 MED ORDER — FENTANYL CITRATE (PF) 100 MCG/2ML IJ SOLN
INTRAMUSCULAR | Status: DC | PRN
  Administered 2023-04-19 (×2): 50 ug via INTRAVENOUS

## 2023-04-19 MED ORDER — HYDRALAZINE HCL 20 MG/ML IJ SOLN
INTRAMUSCULAR | Status: AC
  Filled 2023-04-19: qty 1

## 2023-04-19 MED ORDER — HEPARIN (PORCINE) IN NACL 1000-0.9 UT/500ML-% IV SOLN
INTRAVENOUS | Status: DC | PRN
  Administered 2023-04-19 (×2): 500 mL

## 2023-04-19 MED ORDER — LIDOCAINE HCL (PF) 1 % IJ SOLN
INTRAMUSCULAR | Status: AC
  Filled 2023-04-19: qty 30

## 2023-04-19 MED ORDER — HEPARIN SODIUM (PORCINE) 5000 UNIT/ML IJ SOLN
5000.0000 [IU] | Freq: Three times a day (TID) | INTRAMUSCULAR | Status: DC
  Filled 2023-04-19 (×2): qty 1

## 2023-04-19 MED ORDER — MIDAZOLAM HCL 2 MG/2ML IJ SOLN
INTRAMUSCULAR | Status: AC
Start: 2023-04-19 — End: ?
  Filled 2023-04-19: qty 2

## 2023-04-19 MED ORDER — IODIXANOL 320 MG/ML IV SOLN
INTRAVENOUS | Status: DC | PRN
  Administered 2023-04-19: 115 mL

## 2023-04-19 MED ORDER — MIDAZOLAM HCL 2 MG/2ML IJ SOLN
INTRAMUSCULAR | Status: DC | PRN
  Administered 2023-04-19: 1 mg via INTRAVENOUS

## 2023-04-19 MED ORDER — LABETALOL HCL 5 MG/ML IV SOLN: 10.0000 mg | INTRAVENOUS | Status: DC | PRN

## 2023-04-19 MED ORDER — CLOPIDOGREL BISULFATE 75 MG PO TABS
75.0000 mg | ORAL_TABLET | Freq: Every day | ORAL | Status: DC
  Administered 2023-04-20: 75 mg via ORAL
  Filled 2023-04-19: qty 1

## 2023-04-19 MED ORDER — HEPARIN SODIUM (PORCINE) 1000 UNIT/ML IJ SOLN
INTRAMUSCULAR | Status: AC
  Filled 2023-04-19: qty 10

## 2023-04-19 MED ORDER — SODIUM CHLORIDE 0.9% FLUSH: 3.0000 mL | INTRAVENOUS | Status: DC | PRN

## 2023-04-19 MED ORDER — CLOPIDOGREL BISULFATE 300 MG PO TABS
ORAL_TABLET | ORAL | Status: AC
  Filled 2023-04-19: qty 1

## 2023-04-19 MED ORDER — FENTANYL CITRATE (PF) 100 MCG/2ML IJ SOLN
INTRAMUSCULAR | Status: AC
  Filled 2023-04-19: qty 2

## 2023-04-19 MED ORDER — HYDRALAZINE HCL 20 MG/ML IJ SOLN: 5.0000 mg | INTRAMUSCULAR | Status: DC | PRN

## 2023-04-19 MED ORDER — HYDRALAZINE HCL 20 MG/ML IJ SOLN
INTRAMUSCULAR | Status: DC | PRN
  Administered 2023-04-19: 10 mg via INTRAVENOUS

## 2023-04-19 MED ORDER — AMLODIPINE BESYLATE 5 MG PO TABS
5.0000 mg | ORAL_TABLET | Freq: Every day | ORAL | Status: DC
  Administered 2023-04-20: 5 mg via ORAL
  Filled 2023-04-19: qty 1

## 2023-04-19 MED ORDER — ASPIRIN 81 MG PO TBEC
81.0000 mg | DELAYED_RELEASE_TABLET | Freq: Every day | ORAL | Status: DC
  Administered 2023-04-20: 81 mg via ORAL
  Filled 2023-04-19: qty 1

## 2023-04-19 MED ORDER — ATORVASTATIN CALCIUM 40 MG PO TABS
40.0000 mg | ORAL_TABLET | Freq: Every day | ORAL | Status: DC
  Filled 2023-04-19: qty 1

## 2023-04-19 MED ORDER — LIDOCAINE HCL (PF) 1 % IJ SOLN
INTRAMUSCULAR | Status: DC | PRN
  Administered 2023-04-19: 10 mL

## 2023-04-19 MED ORDER — HYDRALAZINE HCL 50 MG PO TABS
100.0000 mg | ORAL_TABLET | Freq: Three times a day (TID) | ORAL | Status: DC
  Administered 2023-04-19 – 2023-04-20 (×2): 100 mg via ORAL
  Filled 2023-04-19 (×2): qty 2

## 2023-04-19 MED ORDER — ONDANSETRON HCL 4 MG/2ML IJ SOLN: 4.0000 mg | Freq: Four times a day (QID) | INTRAMUSCULAR | Status: DC | PRN

## 2023-04-19 MED ORDER — ASPIRIN 81 MG PO CHEW
CHEWABLE_TABLET | ORAL | Status: AC
  Filled 2023-04-19: qty 1

## 2023-04-19 MED ORDER — CLOPIDOGREL BISULFATE 300 MG PO TABS
ORAL_TABLET | ORAL | Status: DC | PRN
  Administered 2023-04-19: 300 mg via ORAL

## 2023-04-19 MED ORDER — SODIUM CHLORIDE 0.9 % IV SOLN: 250.0000 mL | INTRAVENOUS | Status: DC | PRN

## 2023-04-19 MED ORDER — HEPARIN SODIUM (PORCINE) 1000 UNIT/ML IJ SOLN
INTRAMUSCULAR | Status: DC | PRN
  Administered 2023-04-19: 9000 [IU] via INTRAVENOUS
  Administered 2023-04-19: 3000 [IU] via INTRAVENOUS

## 2023-04-19 MED ORDER — ACETAMINOPHEN 325 MG PO TABS
650.0000 mg | ORAL_TABLET | ORAL | Status: DC | PRN
  Administered 2023-04-19: 650 mg via ORAL
  Filled 2023-04-19: qty 2

## 2023-04-19 MED ORDER — ROSUVASTATIN CALCIUM 5 MG PO TABS
10.0000 mg | ORAL_TABLET | Freq: Every day | ORAL | Status: DC
  Administered 2023-04-20: 10 mg via ORAL
  Filled 2023-04-19 (×2): qty 2

## 2023-04-19 MED ORDER — ASPIRIN 81 MG PO CHEW
CHEWABLE_TABLET | ORAL | Status: DC | PRN
  Administered 2023-04-19: 81 mg via ORAL

## 2023-04-19 SURGICAL SUPPLY — 18 items
BALLN COYOTE ES OTW 2X40X144 (BALLOONS) ×2
BALLOON COYOTE ES OTW 2X40X144 (BALLOONS) IMPLANT
CATH CXI 2.3F 150 ANG (CATHETERS) IMPLANT
CATH OMNI FLUSH 5F 65CM (CATHETERS) IMPLANT
CATH SHOCKWAVE E8 2.5X80 (CATHETERS) IMPLANT
DEVICE CLOSURE MYNXGRIP 6/7F (Vascular Products) IMPLANT
GLIDEWIRE ADV .035X260CM (WIRE) IMPLANT
KIT ENCORE 26 ADVANTAGE (KITS) IMPLANT
KIT SINGLE USE MANIFOLD (KITS) IMPLANT
SET ATX-X65L (MISCELLANEOUS) IMPLANT
SHEATH CATAPULT 6FR 60 (SHEATH) IMPLANT
SHEATH PINNACLE 5F 10CM (SHEATH) IMPLANT
SHEATH PINNACLE 6F 10CM (SHEATH) IMPLANT
SHEATH PROBE COVER 6X72 (BAG) IMPLANT
TRAY PV CATH (CUSTOM PROCEDURE TRAY) ×2 IMPLANT
WIRE BENTSON .035X145CM (WIRE) IMPLANT
WIRE MICRO SET SILHO 5FR 7 (SHEATH) IMPLANT
WIRE SHEPHERD 4G .014 (WIRE) IMPLANT

## 2023-04-19 NOTE — Progress Notes (Signed)
 Patient brought to 4E from cath lab. VSS. Telemetry box applied, CCMD notified. Patient oriented to room and staff. Call bell in reach.  Kenard Gower, RN

## 2023-04-19 NOTE — Progress Notes (Signed)
 Summary: Samuel Gardner is a 53 y.o. male  with past medical history of ESRD on HD @ [facility] MWF, HFpEF (60-65% 11/2022), T2DM, and HTN who presents with one day history of left arm pain with associated ecchymosis/swelling and L inferolateral midfoot wound and admitted for non-healing diabetic foot ulcer of left midfoot.   HD: 1  Subjective:  Patient seen and evaluated while receiving dialysis. Reports his arm is better with no pain. Patient open to restarting statin. No acute complaints.   Objective:  Vital signs in last 24 hours: Vitals:   04/19/23 1230 04/19/23 1300 04/19/23 1320 04/19/23 1333  BP: (!) 166/73 (!) 178/73 (!) 171/74 (!) 188/77  Pulse: (!) 53 (!) 54 (!) 52 (!) 58  Resp: 11 13 12 13   Temp:   98.5 F (36.9 C)   TempSrc:      SpO2: 99% 90% 98% 99%  Weight:    79.2 kg  Height:       Physical Exam Constitutional:      General: He is not in acute distress. Cardiovascular:     Rate and Rhythm: Regular rhythm.     Pulses:          Dorsalis pedis pulses are 0 on the right side and 0 on the left side.  Pulmonary:     Effort: Pulmonary effort is normal.     Breath sounds: Normal breath sounds.  Skin:    Comments: left anterior forearm with ecchymosis/swelling, palpable thrill appreciated at AFV site; foot ulcer covered with dressing  Neurological:     Mental Status: He is alert.    Weight change:   Intake/Output Summary (Last 24 hours) at 04/19/2023 1342 Last data filed at 04/19/2023 1333 Gross per 24 hour  Intake 480 ml  Output 3200 ml  Net -2720 ml      Latest Ref Rng & Units 04/19/2023    3:15 AM 04/17/2023    7:39 PM 04/11/2023    7:57 AM  CBC  WBC 4.0 - 10.5 K/uL 6.2  7.3    Hemoglobin 13.0 - 17.0 g/dL 8.2  8.7  9.5   Hematocrit 39.0 - 52.0 % 26.5  27.6  28.0   Platelets 150 - 400 K/uL 188  213        Latest Ref Rng & Units 04/19/2023    3:15 AM 04/17/2023    7:39 PM 04/11/2023    7:57 AM  CMP  Glucose 70 - 99 mg/dL 886  872  93   BUN 6  - 20 mg/dL 40  32  72   Creatinine 0.61 - 1.24 mg/dL 4.70  6.25  3.19   Sodium 135 - 145 mmol/L 137  142  140   Potassium 3.5 - 5.1 mmol/L 5.1  4.2  4.8   Chloride 98 - 111 mmol/L 102  101  110   CO2 22 - 32 mmol/L 26  29    Calcium  8.9 - 10.3 mg/dL 8.1  8.2    Total Protein 6.5 - 8.1 g/dL 5.8     Total Bilirubin 0.0 - 1.2 mg/dL 0.3     Alkaline Phos 38 - 126 U/L 69     AST 15 - 41 U/L 12     ALT 0 - 44 U/L 13      Phosphorus: 4.0 Albumin: 2.7  Sed Rate: 39  MRI Foot L w/o Contrast: IMPRESSION: 1. No acute abnormality. No evidence of osteomyelitis.  Assessment/Plan:  Principal Problem:  Ulcer of foot (HCC) Active Problems:   ESRD (end stage renal disease) (HCC)  PAD Nonhealing Ulcer of Left Midfoot Vascular Surgery and Podiatry following - appreciate recommendations. Patient scheduled for abdominal aortogram today with vascular surgery as doppler waveforms at left ankle suggestive of arterial occlusive disease. A1c and LDL 03/2023 were 5.4 and 94. Patient remains afebrile with no leukocytosis.  - Abdominal Aortogram scheduled today with Vascular - Podiatry to follow up - Trend CBC/RFP   ESRD Left Forearm Ecchymosis  Anemia of Chronic Disease Vascular evaluated and attributed presentation with infiltration and believes it can most likely still be used for HD. Patient receiving dialysis through fistula today. AVF with palpable thrill. Nephrology consulted for inpatient HD, appreciate recommendations. They report he received ESA on 2/03 and is not due for 4 weeks. Hbg stable 8.2 today from 8.7 yesterday. Screening colonoscopy 1/28 showed 1 pre-cancerous and 3 benign polyps that were removed. - HD scheduled for today   HTN Hypertensive on admission. Remained hypertensive overnight. Team spoke with nephrology who recommends restarting all four home meds. -Resume home Amlodipine  5 mg/daily, Imdur  90 mg/daily, Coreg  25 mg BID and Hydralazine  100 mg TID  -CTM    HLD LDL 03/2023  was 94. Patient previously prescribed Lipitor but has not been taking it 2/2 cramping. Will start 10 mg Crestor  as it is hypothesized that Crestor  may be less likely to cause muscular side effects.  - Start 10 mg Crestor  daily    HFpEF No signs of volume overload. - Consider restarting home lasix  80 mg after dialysis  - Trend RFP   T2DM Well controlled without medications.  -Trend CGM   Suspected COPD No PFT's on file, but it appears this test has been ordered. Saturating 100% on RA. -Resume home albuterol  prn   Diet: Renal VTE: Heparin  Fluids: None Code: Full  Dispo: Anticipated discharge in 1 day   LOS: 0 days   Saunders Samuel Gardner, Medical Student 04/19/2023, 1:42 PM

## 2023-04-19 NOTE — Progress Notes (Addendum)
 Samuel Gardner

## 2023-04-19 NOTE — Op Note (Signed)
 Patient name: Samuel Gardner MRN: [id number] DOB: [birth year] Sex: male  04/19/2023 Pre-operative Diagnosis: Left lower extremity critical limb ischemia with tissue loss Post-operative diagnosis:  Same Surgeon:  Fonda FORBES Rim, MD Procedure Performed: 1.  Ultrasound-guided micropuncture access of the right common femoral artery in retrograde fashion 2.  Aortogram 3.  Second-order cannulation, left lower extremity angiogram 4.  Third order cannulation, left lower extremity angiogram 5.  Balloon lithotripsy left tibioperoneal trunk, peroneal artery 2.5 x 80, 5 rounds of 40 pulses 6.  Balloon lithotripsy left tibioperoneal trunk, posterior tibial artery 2.5 x 80, 5 rounds of 40 pulses 7.  Moderate sedation 90 minutes, contrast volume 115 mL 8.  Device assisted closure-Mynx   Indications: Patient is a 53 year old male with longstanding history of diabetes, now with end-stage renal disease.  He presented with fistula infiltration, but is recovering well from that.  During assessment, he was noted to have a foot wound.  He had nonpalpable pulses in the foot, with ABIs demonstrating a toe pressure of 0.  After discussing risks and benefits of left lower extremity angiogram in an effort to define and improve distal perfusion for wound healing, Samuel Gardner elected to proceed.  Findings:   Aortogram: Bilateral renal arteries patent, no flow-limiting stenosis in the aortoiliac segments bilaterally.  On the left: Widely patent common femoral artery, profunda.  There is disease throughout the superficial femoral artery, however no flow-limiting stenosis.  There were 2 lesions that were roughly 40% in the P1 segment of the popliteal artery, otherwise the popliteal artery was widely patent.  Distally, there was severe small vessel disease.  Samuel Gardner had multiple, tandem, flow-limiting stenoses in the proximal peroneal artery and to the posterior tibial artery.  The posterior tibial artery ended in  collaterals in the distal third of the leg continuing into the foot as collaterals.  The peroneal artery provided inline flow to the ankle and subsequent flow to the foot via medial and lateral perforators. Severe small vessel disease in the foot  On the right: Widely patent common femoral artery, profunda, superficial femoral artery.  Widely patent popliteal artery.  Single-vessel peroneal outflow to the level of the ankle filling the foot through medial lateral perforator branches.  Tibial rod in place. Severe small vessel disease in the foot   Procedure:  The patient was identified in the holding area and taken to room 8.  The patient was then placed supine on the table and prepped and draped in the usual sterile fashion.  A time out was called.  Ultrasound was used to evaluate the right common femoral artery.  It was patent .  A digital ultrasound image was acquired.  A micropuncture needle was used to access the right common femoral artery under ultrasound guidance.  An 018 wire was advanced without resistance and a micropuncture sheath was placed.  The 018 wire was removed and a benson wire was placed.  The micropuncture sheath was exchanged for a 5 french sheath.  An omniflush catheter was advanced over the wire to the level of L-1.  An abdominal angiogram was obtained.  Next, using the omniflush catheter and a benson wire, the aortic bifurcation was crossed and the catheter was placed into theleft external iliac artery and left runoff was obtained.  right runoff was performed via retrograde sheath injections.  I elected to intervene on the left peroneal and posterior tibial arteries.  The patient was heparinized and a 6 x 60 cm sheath was parked in the  left superficial femoral artery.  Next, a series of wires and catheters were used to cannulate the peroneal artery.  This was exchanged for a 0.014 wire.  There was severe calcification, and I was concerned for dissection, therefore I elected to move  forward with balloon lithotripsy using the shockwave device.  I elected to use a 2.5 x 80 mm balloon inflated to 2 atm.  In total, there were 5 rounds of 40 pulses.  Follow-up angiography demonstrated excellent result with resolution of flow-limiting stenosis.  There was notable lesion in the proximal section which was nonflow limiting, therefore it was left.  Next I used the same wire to cannulate the peroneal artery.  I again used balloon lithotripsy 2.5 x 80 mm balloon inflated to 2 atm.  In total I used 5 rounds of 40 pulses.  Follow-up angiography demonstrated excellent result and similar to the peroneal, there is a small intimal lesion.  I elected to use for the balloon angioplasty 2 x 40 mm balloon for 3 minutes which demonstrated excellent results on case completion.  There were no flow-limiting lesions appreciated in the proximal posterior tibial or peroneal arteries.  I again looked at the distal SFA and popliteal artery.  I elected not intervene as the lesions were less than 50%.  Arteriotomy was managed with a minx device without issue.   Impression: Successful resolution of flowing stenosis at the proximal posterior tibial and peroneal arteries.  Patient with single-vessel inline flow to the foot via the peroneal artery with the posterior tibial artery continuing to the distal third ending and collaterals. Patient has been maximally revascularized.  No intervention performed on the left leg.     Fonda FORBES Rim MD [facility] Office: [phone]

## 2023-04-19 NOTE — Procedures (Signed)
Per Dr. Franky Macho to try for 1-2 liters fluid removal due to patient being under dry weight. Goal adjusted to 2 liters, patient aware.

## 2023-04-19 NOTE — Progress Notes (Signed)
 [facility] Progress Note   Subjective:    Seen and examined patient on HD. Noted patient was under EDW this morning. UFG set to 1L. He is euvolemic on exam. BP 170/75. Denies SOB, CP, and N/V.  Objective Vitals:   04/19/23 0908 04/19/23 0928 04/19/23 0940 04/19/23 1000  BP: (!) 164/77 (!) 174/74 (!) 171/74 (!) 179/78  Pulse: (!) 57 (!) 58 (!) 56 (!) 57  Resp: 18 15 12 11   Temp:  98 F (36.7 C)    TempSrc:      SpO2:  99% 97% 96%  Weight:  81.4 kg    Height:       Physical Exam General: Awake, alert, on RA, NAD Heart:S1 and S2; No murmurs, gallops, or rubs Lungs: Clear anteriorly. Breathing unlabored Abdomen: Soft and non-tender Extremities: No LE edema Dialysis Access: L AVF (+) B/T   Filed Weights   04/18/23 1700 04/19/23 0928  Weight: 84.7 kg 81.4 kg    Intake/Output Summary (Last 24 hours) at 04/19/2023 1030 Last data filed at 04/19/2023 0630 Gross per 24 hour  Intake 480 ml  Output 1200 ml  Net -720 ml    Additional Objective Labs: Basic Metabolic Panel: Recent Labs  Lab 04/17/23 1939 04/18/23 1648 04/19/23 0315  NA 142  --  137  K 4.2  --  5.1  CL 101  --  102  CO2 29  --  26  GLUCOSE 127*  --  113*  BUN 32*  --  40*  CREATININE 3.74*  --  5.29*  CALCIUM  8.2*  --  8.1*  PHOS  --  4.0  --    Liver Function Tests: Recent Labs  Lab 04/18/23 1648 04/19/23 0315  AST  --  12*  ALT  --  13  ALKPHOS  --  69  BILITOT  --  0.3  PROT  --  5.8*  ALBUMIN 2.9* 2.7*   No results for input(s): LIPASE, AMYLASE in the last 168 hours. CBC: Recent Labs  Lab 04/17/23 1939 04/19/23 0315  WBC 7.3 6.2  NEUTROABS 5.0  --   HGB 8.7* 8.2*  HCT 27.6* 26.5*  MCV 89.3 90.1  PLT 213 188   Blood Culture No results found for: SDES, SPECREQUEST, CULT, REPTSTATUS  Cardiac Enzymes: No results for input(s): CKTOTAL, CKMB, CKMBINDEX, TROPONINI in the last 168 hours. CBG: Recent Labs  Lab 04/19/23 0728  GLUCAP 89   Iron  Studies: No results for input(s): IRON, TIBC, TRANSFERRIN, FERRITIN in the last 72 hours. Lab Results  Component Value Date   INR 1.0 04/17/2023   Studies/Results: MR FOOT LEFT WO CONTRAST Result Date: 04/18/2023 CLINICAL DATA:  Left great toe pain and ulcer. EXAM: MRI OF THE LEFT FOOT WITHOUT CONTRAST TECHNIQUE: Multiplanar, multisequence MR imaging of the left forefoot was performed. No intravenous contrast was administered. COMPARISON:  None Available. FINDINGS: Bones/Joint/Cartilage No marrow signal abnormality. No fracture or dislocation. Mild degenerative changes of the first MTP joint. No joint effusion. Ligaments Collateral ligaments are intact. Muscles and Tendons Flexor and extensor tendons are intact. No tenosynovitis. Increased T2 signal within the intrinsic muscles of the forefoot, nonspecific, but likely related to diabetic muscle changes. Soft tissue Mild dorsal forefoot soft tissue swelling. No fluid collection or hematoma. No soft tissue mass. IMPRESSION: 1. No acute abnormality. No evidence of osteomyelitis. Electronically Signed   By: Elsie ONEIDA Shoulder M.D.   On: 04/18/2023 21:26   VAS US  ABI WITH/WO TBI Result Date: 04/18/2023  LOWER  EXTREMITY DOPPLER STUDY Patient Name:  MAURICO PERRELL  Date of Exam:   04/18/2023 Medical Rec #: [id number]          Accession #:    [id number] Date of Birth: 1970/07/06           Patient Gender: M Patient Age:   53 years Exam Location:  [facility] Procedure:      VAS US  ABI WITH/WO TBI Referring Phys: AMJAD ALI --------------------------------------------------------------------------------  Indications: Ulceration. High Risk Factors: Hypertension, Diabetes.  Comparison Study: None. Performing Technologist: Garnette Rockers  Examination Guidelines: A complete evaluation includes at minimum, Doppler waveform signals and systolic blood pressure reading at the level of bilateral brachial, anterior tibial, and posterior tibial arteries, when vessel  segments are accessible. Bilateral testing is considered an integral part of a complete examination. Photoelectric Plethysmograph (PPG) waveforms and toe systolic pressure readings are included as required and additional duplex testing as needed. Limited examinations for reoccurring indications may be performed as noted.  ABI Findings: +---------+------------------+-----+-------------------+----------------+ Right    Rt Pressure (mmHg)IndexWaveform           Comment          +---------+------------------+-----+-------------------+----------------+ Brachial 202                    triphasic                           +---------+------------------+-----+-------------------+----------------+ PTA                             dampened monophasicNon-compressible +---------+------------------+-----+-------------------+----------------+ DP                              dampened monophasicNon-compressible +---------+------------------+-----+-------------------+----------------+ Great Toe23                0.11 Abnormal                            +---------+------------------+-----+-------------------+----------------+ +---------+------------------+-----+-------------------+----------------------+ Left     Lt Pressure (mmHg)IndexWaveform           Comment                +---------+------------------+-----+-------------------+----------------------+ Brachial                                           Contraindicated by AVF +---------+------------------+-----+-------------------+----------------------+ PTA      196               0.97 dampened monophasic                       +---------+------------------+-----+-------------------+----------------------+ DP       160               0.79 dampened monophasic                       +---------+------------------+-----+-------------------+----------------------+ Great Toe50                0.25 Abnormal                                   +---------+------------------+-----+-------------------+----------------------+ +-------+-----------+-----------+------------+------------+ ABI/TBIToday's ABIToday's TBIPrevious  ABIPrevious TBI +-------+-----------+-----------+------------+------------+ Right  [place]         0.11                                +-------+-----------+-----------+------------+------------+ Left   0.97       0.25                                +-------+-----------+-----------+------------+------------+  Arterial wall calcification precludes accurate ankle pressures and ABIs.  Summary: Right: Resting right ankle-brachial index indicates noncompressible right lower extremity arteries. The right toe-brachial index is abnormal. Left: Resting left ankle-brachial index is within normal range. The left toe-brachial index is abnormal. Although ankle brachial indices are within normal limits (0.95-1.29), arterial Doppler waveforms at the ankle suggest some component of arterial occlusive disease. *See table(s) above for measurements and observations.  Electronically signed by Norman Serve on 04/18/2023 at 1:53:22 PM.    Final     Medications:   amLODipine   5 mg Oral Daily   carvedilol   25 mg Oral BID WC   Chlorhexidine  Gluconate Cloth  6 each Topical Q0600   heparin   5,000 Units Subcutaneous Q8H   hydrALAZINE   100 mg Oral TID   isosorbide  mononitrate  90 mg Oral QHS    Dialysis Orders: [facility]WF [facility]    4h  B400   82kg  2/2.5 bath  AVF  Heparin  none - last HD 2/03, post wt 85.7kg (+3.7kg) - venofer 100mg  q hd thru 2/24 - mircera 75 mcg IV q 4 wks, last 2/03 given yesterday  Renal-related home meds: - norvasc  5 - rocaltrol 0.25 three times per week - coreg  25 bid - lasix  80 daily - hydralazine  100mg  tid - imdur  90 hs - metolazone 5mg  once weekly   Assessment/Plan: L midfoot diabetic foot ulcer - MRI pending, per pmd  L arm ecchymosis - sp infiltration yest 2/03, this was only his 3rd HD session HTN - BP's  a bit high 150- 180 sbp. Cont home meds, get vol down w/ HD. No edema ESRD - on HD MWF. On HD.  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 Just got esa on 2/03, not due for 4 wks. Follow.  Secondary hyperparathyroidism - Ca and phos in range. Cont binders w/ meals. Albumin low, will consider protein supplements if needed  Charmaine Piety, NP [facility] 04/19/2023,10:30 AM  LOS: 0 days

## 2023-04-19 NOTE — TOC Progression Note (Signed)
 Transition of Care [facility]) - Progression Note    Patient Details  Name: Samuel Gardner MRN: [id number] Date of Birth: [birth year]  Transition of [facility] CM/SW Contact  Tom-Johnson, Sun Kihn Daphne, RN Phone Number: 04/19/2023, 4:27 PM  Clinical Narrative:     CM consulted to assist with transportation to and from Outpatient HD at discharge. cM spoke with patient at bedside and his mother, Sharlet via Face time. Sharlet states patient has a personal aid 7 dys/wk for 2.5 hrs/day and also have family members to assist with getting him to the transportation arranged. States patient became inactive with [employer] when his Insurance was changed from [facility] Medicaid to [facility]. Request assistance with getting patient reinstated.  CM called [facility] Transportation [phone]) in patient's room with patient listening in and answering questions and was told they do not have patient's information to call [facility] at ([phone]) and was told the same that they do not have patient's information that patient will have to submit a new application.   Patient aware of not being able to get assistance with scheduling his transportation. CM informed MD.  CM will make another attempt tomorrow. CM will continue to follow.          Expected Discharge Plan and Services                                               Social Determinants of Health (SDOH) Interventions SDOH Screenings   Food Insecurity: No Food Insecurity (04/18/2023)  Housing: Low Risk  (04/18/2023)  Transportation Needs: No Transportation Needs (04/18/2023)  Utilities: Not At Risk (04/18/2023)  Depression (PHQ2-9): Medium Risk (12/05/2022)  Social Connections: Moderately Isolated (02/23/2022)  Tobacco Use: Medium Risk (04/17/2023)    Readmission Risk Interventions     No data to display

## 2023-04-19 NOTE — Progress Notes (Signed)
 Pt receives out-pt HD at [facility] on MWF. Will assist as needed.   Olivia Canter Renal Navigator [phone]

## 2023-04-19 NOTE — Procedures (Signed)
 Received patient in bed to unit.  Alert and oriented.  Informed consent signed and in chart.   TX duration:  Patient tolerated well.  Transported back to the room  Alert, without acute distress.  Hand-off given to patient's nurse.   Access used: lavf Access issues: none  Total UF removed: 2 liters Medication(s) given: none   Greig Silvan, RN [facility]

## 2023-04-19 NOTE — Care Management Obs Status (Deleted)
 MEDICARE OBSERVATION STATUS NOTIFICATION   Patient Details  Name: Samuel Gardner MRN: [id number] Date of Birth: Aug 30, 1970   Medicare Observation Status Notification Given:  Yes    Tom-Johnson, Angelique Ken, RN 04/19/2023, 4:23 PM

## 2023-04-19 NOTE — Progress Notes (Signed)
 NPO order placed this am Consent for angiogram with possible intervention for non healing plantar left foot ulcer       ABI Findings:  +---------+------------------+-----+-------------------+----------------+  Right   Rt Pressure (mmHg)IndexWaveform           Comment           +---------+------------------+-----+-------------------+----------------+  Brachial 202                    triphasic                            +---------+------------------+-----+-------------------+----------------+  PTA                            dampened monophasicNon-compressible  +---------+------------------+-----+-------------------+----------------+  DP                             dampened monophasicNon-compressible  +---------+------------------+-----+-------------------+----------------+  Great Toe23                0.11 Abnormal                             +---------+------------------+-----+-------------------+----------------+   +---------+------------------+-----+-------------------+-------------------  ---+  Left    Lt Pressure (mmHg)IndexWaveform           Comment                  +---------+------------------+-----+-------------------+-------------------  ---+  Brachial                                           Contraindicated by  AVF  +---------+------------------+-----+-------------------+-------------------  ---+  PTA     196               0.97 dampened monophasic                         +---------+------------------+-----+-------------------+-------------------  ---+  DP      160               0.79 dampened monophasic                         +---------+------------------+-----+-------------------+-------------------  ---+  Great Toe50                0.25 Abnormal                                    +---------+------------------+-----+-------------------+-------------------  ---+    +-------+-----------+-----------+------------+------------+  ABI/TBIToday's ABIToday's TBIPrevious ABIPrevious TBI  +-------+-----------+-----------+------------+------------+  Right [place]         0.11                                 +-------+-----------+-----------+------------+------------+  Left  0.97       0.25                                 +-------+-----------+-----------+------------+------------+   Calcified right LE tibials with B decreased TBI's Pending HD access duplex.  Maurilio Deland Collet [name]

## 2023-04-19 NOTE — Consult Note (Signed)
 Subjective:  Patient ID: Samuel Gardner, male    DOB: May 25, 1970,  MRN: [id number]  Patient with past medical history of ESRD, DM2 seen at beside in hemodialysis today for concern of non healing left foot wound. He has followed with me and was scheduled to follow-up but was admitted to the hospital for concern of left arm fistula. While here was able to get ABIs done that show diminished flow and vascular plans for angiogram today. Patient denies any current pain or issues with the foot. Has been keeping the area dressed and clean. Denies any n/v/f/c.   Past Medical History:  Diagnosis Date   (HFpEF) heart failure with preserved ejection fraction (HCC)    Ascending aorta dilatation (HCC)    40mm by echo 11/2022   CKD (chronic kidney disease), stage IV (HCC)    Diabetes mellitus without complication (HCC)    Hypertension    Mitral regurgitation    mild by echo 11/2022   Normocytic anemia      Past Surgical History:  Procedure Laterality Date   AV FISTULA PLACEMENT Left 12/13/2022   Procedure: LEFT ARM BRACHIOCEPHALIC ARTERIOVENOUS (AV) FISTULA CREATION;  Surgeon: [name], MD;  Location: [facility] OR;  Service: Vascular;  Laterality: Left;   COLONOSCOPY WITH PROPOFOL  N/A 04/11/2023   Procedure: COLONOSCOPY WITH PROPOFOL ;  Surgeon: [name], MD;  Location: [facility];  Service: Gastroenterology;  Laterality: N/A;   LEG SURGERY Right    teenager   POLYPECTOMY  04/11/2023   Procedure: POLYPECTOMY;  Surgeon: [name], MD;  Location: [facility];  Service: Gastroenterology;;       Latest Ref Rng & Units 04/19/2023    3:15 AM 04/17/2023    7:39 PM 04/11/2023    7:57 AM  CBC  WBC 4.0 - 10.5 K/uL 6.2  7.3    Hemoglobin 13.0 - 17.0 g/dL 8.2  8.7  9.5   Hematocrit 39.0 - 52.0 % 26.5  27.6  28.0   Platelets 150 - 400 K/uL 188  213         Latest Ref Rng & Units 04/19/2023    3:15 AM 04/17/2023    7:39 PM 04/11/2023    7:57 AM  BMP  Glucose 70 - 99 mg/dL 886  872  93    BUN 6 - 20 mg/dL 40  32  72   Creatinine 0.61 - 1.24 mg/dL 4.70  6.25  3.19   Sodium 135 - 145 mmol/L 137  142  140   Potassium 3.5 - 5.1 mmol/L 5.1  4.2  4.8   Chloride 98 - 111 mmol/L 102  101  110   CO2 22 - 32 mmol/L 26  29    Calcium  8.9 - 10.3 mg/dL 8.1  8.2       Objective:   Vitals:   04/19/23 1230 04/19/23 1300  BP: (!) 166/73 (!) 178/73  Pulse: (!) 53 (!) 54  Resp: 11 13  Temp:    SpO2: 99% 90%    General:AA&O x 3. Normal mood and affect   Vascular: DP and PT pulses 2/4 bilateral. Brisk capillary refill to all digits. Pedal hair present   Neruological. Epicritic sensation grossly intact.   Derm: Lateral left foot wound with necrotic base measuring 2 cm x 1.5 cm x 0.2 cm . No erythema or edema present. No purulence. No probe to bone. Interspaces clears of maceration. Nails well groomed and normal in appearance  MSK: MMT 5/5 in dorsiflexion, plantar flexion,  inversion and eversion. Normal joint ROM without pain or crepitus.   MRI left foot   IMPRESSION: 1. No acute abnormality. No evidence of osteomyelitis.     Assessment & Plan:  Patient was evaluated and treated and all questions answered.  DX: Left diabetic foot wound unstageable.  Wound care: betadine, DSD daily to wound  Planning on angiogram with vascular that will hopefully improve flow and healing potential of the wound. Will hold any debridements for now.  Clinically wound does not appear infected so can hold antibiotics. MRI negative for any deep infection.  DME: post-op shoe   Discussed with patient diagnosis and treatment options.  Imaging reviewed. MRI negative Discussed continued wound care following vascular procedure and improved flow. No plans for any surgical intervention at this time. Will plan to have patient follow-up with me in clinic in about a week after discharge from the hospital.   Patient in agreement with plan and all questions answered.   Asberry Failing, DPM  Accessible via  secure chat for questions or concerns.

## 2023-04-20 ENCOUNTER — Ambulatory Visit: Payer: 59 | Admitting: Cardiology

## 2023-04-20 ENCOUNTER — Other Ambulatory Visit (HOSPITAL_COMMUNITY): Payer: Self-pay

## 2023-04-20 ENCOUNTER — Telehealth: Payer: Self-pay | Admitting: Podiatry

## 2023-04-20 DIAGNOSIS — I739 Peripheral vascular disease, unspecified: Secondary | ICD-10-CM | POA: Diagnosis not present

## 2023-04-20 DIAGNOSIS — N186 End stage renal disease: Secondary | ICD-10-CM | POA: Diagnosis not present

## 2023-04-20 DIAGNOSIS — Z992 Dependence on renal dialysis: Secondary | ICD-10-CM | POA: Diagnosis not present

## 2023-04-20 DIAGNOSIS — L97529 Non-pressure chronic ulcer of other part of left foot with unspecified severity: Secondary | ICD-10-CM | POA: Diagnosis not present

## 2023-04-20 LAB — CBC
HCT: 28.1 % — ABNORMAL LOW (ref 39.0–52.0)
Hemoglobin: 8.9 g/dL — ABNORMAL LOW (ref 13.0–17.0)
MCH: 28.2 pg (ref 26.0–34.0)
MCHC: 31.7 g/dL (ref 30.0–36.0)
MCV: 88.9 fL (ref 80.0–100.0)
Platelets: 223 10*3/uL (ref 150–400)
RBC: 3.16 MIL/uL — ABNORMAL LOW (ref 4.22–5.81)
RDW: 13.2 % (ref 11.5–15.5)
WBC: 6.6 10*3/uL (ref 4.0–10.5)
nRBC: 0 % (ref 0.0–0.2)

## 2023-04-20 LAB — RENAL FUNCTION PANEL
Albumin: 2.8 g/dL — ABNORMAL LOW (ref 3.5–5.0)
Anion gap: 11 (ref 5–15)
BUN: 25 mg/dL — ABNORMAL HIGH (ref 6–20)
CO2: 26 mmol/L (ref 22–32)
Calcium: 8.4 mg/dL — ABNORMAL LOW (ref 8.9–10.3)
Chloride: 97 mmol/L — ABNORMAL LOW (ref 98–111)
Creatinine, Ser: 4.52 mg/dL — ABNORMAL HIGH (ref 0.61–1.24)
GFR, Estimated: 15 mL/min — ABNORMAL LOW (ref >60)
Glucose, Bld: 113 mg/dL — ABNORMAL HIGH (ref 70–99)
Phosphorus: 4.8 mg/dL — ABNORMAL HIGH (ref 2.5–4.6)
Potassium: 4.5 mmol/L (ref 3.5–5.1)
Sodium: 134 mmol/L — ABNORMAL LOW (ref 135–145)

## 2023-04-20 LAB — LIPID PANEL
Cholesterol: 154 mg/dL (ref 0–200)
HDL: 35 mg/dL — ABNORMAL LOW (ref >40)
LDL Cholesterol: 93 mg/dL (ref 0–99)
Total CHOL/HDL Ratio: 4.4 {ratio}
Triglycerides: 132 mg/dL (ref <150)
VLDL: 26 mg/dL (ref 0–40)

## 2023-04-20 MED ORDER — CLOPIDOGREL BISULFATE 75 MG PO TABS
75.0000 mg | ORAL_TABLET | Freq: Every day | ORAL | 0 refills | Status: DC
  Filled 2023-04-20: qty 30, 30d supply, fill #0

## 2023-04-20 MED ORDER — ROSUVASTATIN CALCIUM 20 MG PO TABS
20.0000 mg | ORAL_TABLET | Freq: Every day | ORAL | 0 refills | Status: DC
  Filled 2023-04-20: qty 30, 30d supply, fill #0

## 2023-04-20 MED ORDER — ASPIRIN 81 MG PO TBEC
81.0000 mg | DELAYED_RELEASE_TABLET | Freq: Every day | ORAL | 0 refills | Status: DC
  Filled 2023-04-20: qty 30, 30d supply, fill #0

## 2023-04-20 NOTE — Discharge Instructions (Addendum)
 [name],   Thank you for entrusting us  with your care during your hospitalization. We think your left arm swelling, bruising and tenderness was from your fistula site infiltrating after your dialysis session prior to your hospitalization. You received dialysis through this fistula while being in the hospital without complications. Additionally, Vascular Surgery found narrowing of some of your blood vessels going to your left foot, which may be contributing to your non healing wound. You underwent an abdominal aortogram procedure that allowed the vascular surgeon to take a better look at your vessels and attempt to open some of those vessel to allow for better blood flow. You were also seen by Podiatry, the foot doctor to assess your wound. They recommend wound care and following with them outpatient. Please see the following instructions as you prepare to leave the hospital:  Renal Disease  Left Arm Bruising and Swelling  - Continue following with your nephrologist and attending your scheduled dialysis sessions - Continue with your normal dialysis schedule starting tomorrow - The swelling, tenderness and bruising of your arm should resolve in a few days. Please call your primary care provider if it does not resolve in a week or if you experience increased swelling, pain or bruising   Peripheral Arterial Disease Left Foot Ulcer - Vascular surgery widened some of your blood vessels to your foot. We hope this will increase blood flow and provide better healing for your ulcer - You will also be started on Aspirin  and Plavix  to help with this as well - Please follow-up with Vascular surgery (they should be calling you to make an appointment) and they can further discuss the duration of those medications - Podiatry recommends proper wound care (betadine, Drysee Dressing daily to wound) and following with them outpatient within 2 weeks  - Please wear shoes as you are at an increased risk of developing  additional wounds given your history of diabetes and peripheral vascular disease  High Blood Pressure - You blood pressure was elevated during your admission, so we restarted your home blood pressure medications  - Please continue Amlodipine  5 mg/daily, Imdur  90 mg/daily, Coreg  25 mg twice a day and Hydralazine  100 mg three times per day - We recommend you follow up with your primary care provider for blood pressure management   Elevated Cholesterol  - Elevated low-density lipoprotein (LDL) cholesterol can increase your risk of developing plaque that can cause narrowing of your blood vessels leading to worsening peripheral arterial disease and cardiovascular disease  - We started you on a statin called Crestor  20 mg per day as it is thought that Crestor  may be less likely to cause muscular side effects. Please continue this medication when you leave the hospital and follow up with your primary care provider on medication management. If you experience muscle cramping, please call your primary care provider as we may decrease this dose and add an additional medication  Heart Failure - You received one dose of your home diuretic (Lasix ). Given you did not appear to have excess fluid and were scheduled to receive dialysis, we held this medication. We recommend continuing your home dose Lasix  80 mg daily after leaving the hospital  Type 2 Diabetes Mellitus  - Your last hemoglobin A1c was 5.4% in January 2025. Congratulations on a great A1c level! Please follow with your primary care provider for your diabetes management  You are scheduled for a follow up appointment with the [facility] on 05/02/23 at 10:45 am with your primary  care doctor, Samuel Gardner. Thank you again for entrusting us  with your care.   Sincerely,  Your Internal Medicine Team

## 2023-04-20 NOTE — Progress Notes (Signed)
 Advised by attending that pt will d/c today. Contacted [facility] to be advised of pt's d/c today and that pt should resume care tomorrow.   Lauraine Polite Renal Navigator [phone]

## 2023-04-20 NOTE — Progress Notes (Signed)
 [facility] Progress Note   Subjective:    Seen and examined patient at bedside. [name] also at bedside. Tolerated yesterday's HD with net UF 2L. Followed by VVS and Podiatry for diabetic foot wound. S/p angiogram with balloon lithotripsy yesterday by Dr. Lanis. Patient says he is going home today.  Objective Vitals:   04/19/23 2100 04/19/23 2200 04/19/23 2300 04/20/23 0751  BP: 137/61 126/71 136/81 128/72  Pulse: (!) 55  (!) 59 (!) 59  Resp: 16 16 18 19   Temp:   98.3 F (36.8 C) 98.1 F (36.7 C)  TempSrc:   Oral Oral  SpO2: 100%  99% 100%  Weight:      Height:       Physical Exam General: Awake, alert, on RA, NAD Heart:S1 and S2; No murmurs, gallops, or rubs Lungs: Clear anteriorly. Breathing unlabored Abdomen: Soft and non-tender Extremities: No LE edema Dialysis Access: L AVF (+) B/T   Filed Weights   04/18/23 1700 04/19/23 0928 04/19/23 1333  Weight: 84.7 kg 81.4 kg 79.2 kg    Intake/Output Summary (Last 24 hours) at 04/20/2023 1200 Last data filed at 04/19/2023 2159 Gross per 24 hour  Intake --  Output 2400 ml  Net -2400 ml    Additional Objective Labs: Basic Metabolic Panel: Recent Labs  Lab 04/17/23 1939 04/18/23 1648 04/19/23 0315 04/20/23 0312  NA 142  --  137 134*  K 4.2  --  5.1 4.5  CL 101  --  102 97*  CO2 29  --  26 26  GLUCOSE 127*  --  113* 113*  BUN 32*  --  40* 25*  CREATININE 3.74*  --  5.29* 4.52*  CALCIUM  8.2*  --  8.1* 8.4*  PHOS  --  4.0  --  4.8*   Liver Function Tests: Recent Labs  Lab 04/18/23 1648 04/19/23 0315 04/20/23 0312  AST  --  12*  --   ALT  --  13  --   ALKPHOS  --  69  --   BILITOT  --  0.3  --   PROT  --  5.8*  --   ALBUMIN 2.9* 2.7* 2.8*   No results for input(s): LIPASE, AMYLASE in the last 168 hours. CBC: Recent Labs  Lab 04/17/23 1939 04/19/23 0315 04/20/23 0312  WBC 7.3 6.2 6.6  NEUTROABS 5.0  --   --   HGB 8.7* 8.2* 8.9*  HCT 27.6* 26.5* 28.1*  MCV 89.3 90.1 88.9   PLT 213 188 223   Blood Culture No results found for: SDES, SPECREQUEST, CULT, REPTSTATUS  Cardiac Enzymes: No results for input(s): CKTOTAL, CKMB, CKMBINDEX, TROPONINI in the last 168 hours. CBG: Recent Labs  Lab 04/19/23 0728  GLUCAP 89   Iron Studies: No results for input(s): IRON, TIBC, TRANSFERRIN, FERRITIN in the last 72 hours. Lab Results  Component Value Date   INR 1.0 04/17/2023   Studies/Results: PERIPHERAL VASCULAR CATHETERIZATION Result Date: 04/20/2023 Images from the original result were not included.   Patient name: ANTONIOS OSTROW        MRN: [id number]        DOB: 16-Aug-1970            Sex: male  04/19/2023 Pre-operative Diagnosis: Left lower extremity critical limb ischemia with tissue loss Post-operative diagnosis:  Same Surgeon:  Fonda FORBES Lanis, MD Procedure Performed: 1.  Ultrasound-guided micropuncture access of the right common femoral artery in retrograde fashion 2.  Aortogram 3.  Second-order cannulation, left  lower extremity angiogram 4.  Third order cannulation, left lower extremity angiogram 5.  Balloon lithotripsy left tibioperoneal trunk, peroneal artery 2.5 x 80, 5 rounds of 40 pulses 6.  Balloon lithotripsy left tibioperoneal trunk, posterior tibial artery 2.5 x 80, 5 rounds of 40 pulses 7.  Moderate sedation 90 minutes, contrast volume 115 mL 8.  Device assisted closure-Mynx   Indications: Patient is a 53 year old male with longstanding history of diabetes, now with end-stage renal disease.  He presented with fistula infiltration, but is recovering well from that.  During assessment, he was noted to have a foot wound.  He had nonpalpable pulses in the foot, with ABIs demonstrating a toe pressure of 0.  After discussing risks and benefits of left lower extremity angiogram in an effort to define and improve distal perfusion for wound healing, Algie elected to proceed.  Findings:  Aortogram: Bilateral renal arteries patent, no flow-limiting  stenosis in the aortoiliac segments bilaterally.  On the left: Widely patent common femoral artery, profunda.  There is disease throughout the superficial femoral artery, however no flow-limiting stenosis.  There were 2 lesions that were roughly 40% in the P1 segment of the popliteal artery, otherwise the popliteal artery was widely patent.  Distally, there was severe small vessel disease.  [name] had multiple, tandem, flow-limiting stenoses in the proximal peroneal artery and to the posterior tibial artery.  The posterior tibial artery ended in collaterals in the distal third of the leg continuing into the foot as collaterals.  The peroneal artery provided inline flow to the ankle and subsequent flow to the foot via medial and lateral perforators. Severe small vessel disease in the foot  On the right: Widely patent common femoral artery, profunda, superficial femoral artery.  Widely patent popliteal artery.  Single-vessel peroneal outflow to the level of the ankle filling the foot through medial lateral perforator branches.  Tibial rod in place. Severe small vessel disease in the foot             Procedure:  The patient was identified in the holding area and taken to room 8.  The patient was then placed supine on the table and prepped and draped in the usual sterile fashion.  A time out was called.  Ultrasound was used to evaluate the right common femoral artery.  It was patent .  A digital ultrasound image was acquired.  A micropuncture needle was used to access the right common femoral artery under ultrasound guidance.  An 018 wire was advanced without resistance and a micropuncture sheath was placed.  The 018 wire was removed and a benson wire was placed.  The micropuncture sheath was exchanged for a 5 french sheath.  An omniflush catheter was advanced over the wire to the level of L-1.  An abdominal angiogram was obtained.  Next, using the omniflush catheter and a benson wire, the aortic bifurcation was  crossed and the catheter was placed into theleft external iliac artery and left runoff was obtained.  right runoff was performed via retrograde sheath injections.  I elected to intervene on the left peroneal and posterior tibial arteries.  The patient was heparinized and a 6 x 60 cm sheath was parked in the left superficial femoral artery.  Next, a series of wires and catheters were used to cannulate the peroneal artery.  This was exchanged for a 0.014 wire.  There was severe calcification, and I was concerned for dissection, therefore I elected to move forward with balloon lithotripsy using the shockwave  device.  I elected to use a 2.5 x 80 mm balloon inflated to 2 atm.  In total, there were 5 rounds of 40 pulses.  Follow-up angiography demonstrated excellent result with resolution of flow-limiting stenosis.  There was notable lesion in the proximal section which was nonflow limiting, therefore it was left.  Next I used the same wire to cannulate the peroneal artery.  I again used balloon lithotripsy 2.5 x 80 mm balloon inflated to 2 atm.  In total I used 5 rounds of 40 pulses.  Follow-up angiography demonstrated excellent result and similar to the peroneal, there is a small intimal lesion.  I elected to use for the balloon angioplasty 2 x 40 mm balloon for 3 minutes which demonstrated excellent results on case completion.  There were no flow-limiting lesions appreciated in the proximal posterior tibial or peroneal arteries.  I again looked at the distal SFA and popliteal artery.  I elected not intervene as the lesions were less than 50%.  Arteriotomy was managed with a minx device without issue.   Impression: Successful resolution of flowing stenosis at the proximal posterior tibial and peroneal arteries.  Patient with single-vessel inline flow to the foot via the peroneal artery with the posterior tibial artery continuing to the distal third ending and collaterals. Patient has been maximally revascularized.  No  intervention performed on the left leg.    Fonda FORBES Rim MD [facility] Office: [phone]   MR FOOT LEFT WO CONTRAST Result Date: 04/18/2023 CLINICAL DATA:  Left great toe pain and ulcer. EXAM: MRI OF THE LEFT FOOT WITHOUT CONTRAST TECHNIQUE: Multiplanar, multisequence MR imaging of the left forefoot was performed. No intravenous contrast was administered. COMPARISON:  None Available. FINDINGS: Bones/Joint/Cartilage No marrow signal abnormality. No fracture or dislocation. Mild degenerative changes of the first MTP joint. No joint effusion. Ligaments Collateral ligaments are intact. Muscles and Tendons Flexor and extensor tendons are intact. No tenosynovitis. Increased T2 signal within the intrinsic muscles of the forefoot, nonspecific, but likely related to diabetic muscle changes. Soft tissue Mild dorsal forefoot soft tissue swelling. No fluid collection or hematoma. No soft tissue mass. IMPRESSION: 1. No acute abnormality. No evidence of osteomyelitis. Electronically Signed   By: Elsie ONEIDA Shoulder M.D.   On: 04/18/2023 21:26   VAS US  ABI WITH/WO TBI Result Date: 04/18/2023  LOWER EXTREMITY DOPPLER STUDY Patient Name:  HILDING QUINTANAR  Date of Exam:   04/18/2023 Medical Rec #: [id number]          Accession #:    [id number] Date of Birth: [birth year]           Patient Gender: M Patient Age:   19 years Exam Location:  [facility] Procedure:      VAS US  ABI WITH/WO TBI Referring Phys: AMJAD ALI --------------------------------------------------------------------------------  Indications: Ulceration. High Risk Factors: Hypertension, Diabetes.  Comparison Study: None. Performing Technologist: Garnette Rockers  Examination Guidelines: A complete evaluation includes at minimum, Doppler waveform signals and systolic blood pressure reading at the level of bilateral brachial, anterior tibial, and posterior tibial arteries, when vessel segments are accessible. Bilateral testing is  considered an integral part of a complete examination. Photoelectric Plethysmograph (PPG) waveforms and toe systolic pressure readings are included as required and additional duplex testing as needed. Limited examinations for reoccurring indications may be performed as noted.  ABI Findings: +---------+------------------+-----+-------------------+----------------+ Right    Rt Pressure (mmHg)IndexWaveform           Comment          +---------+------------------+-----+-------------------+----------------+  Brachial 202                    triphasic                           +---------+------------------+-----+-------------------+----------------+ PTA                             dampened monophasicNon-compressible +---------+------------------+-----+-------------------+----------------+ DP                              dampened monophasicNon-compressible +---------+------------------+-----+-------------------+----------------+ Great Toe23                0.11 Abnormal                            +---------+------------------+-----+-------------------+----------------+ +---------+------------------+-----+-------------------+----------------------+ Left     Lt Pressure (mmHg)IndexWaveform           Comment                +---------+------------------+-----+-------------------+----------------------+ Brachial                                           Contraindicated by AVF +---------+------------------+-----+-------------------+----------------------+ PTA      196               0.97 dampened monophasic                       +---------+------------------+-----+-------------------+----------------------+ DP       160               0.79 dampened monophasic                       +---------+------------------+-----+-------------------+----------------------+ Great Toe50                0.25 Abnormal                                   +---------+------------------+-----+-------------------+----------------------+ +-------+-----------+-----------+------------+------------+ ABI/TBIToday's ABIToday's TBIPrevious ABIPrevious TBI +-------+-----------+-----------+------------+------------+ Right  [place]         0.11                                +-------+-----------+-----------+------------+------------+ Left   0.97       0.25                                +-------+-----------+-----------+------------+------------+  Arterial wall calcification precludes accurate ankle pressures and ABIs.  Summary: Right: Resting right ankle-brachial index indicates noncompressible right lower extremity arteries. The right toe-brachial index is abnormal. Left: Resting left ankle-brachial index is within normal range. The left toe-brachial index is abnormal. Although ankle brachial indices are within normal limits (0.95-1.29), arterial Doppler waveforms at the ankle suggest some component of arterial occlusive disease. *See table(s) above for measurements and observations.  Electronically signed by Norman Serve on 04/18/2023 at 1:53:22 PM.    Final     Medications:  sodium chloride       amLODipine   5 mg Oral Daily   aspirin  EC  81  mg Oral Daily   carvedilol   25 mg Oral BID WC   Chlorhexidine  Gluconate Cloth  6 each Topical Q0600   clopidogrel   75 mg Oral Q breakfast   heparin   5,000 Units Subcutaneous Q8H   heparin   5,000 Units Subcutaneous Q8H   hydrALAZINE   100 mg Oral TID   isosorbide  mononitrate  90 mg Oral QHS   rosuvastatin   10 mg Oral Daily   sodium chloride  flush  3 mL Intravenous Q12H    Dialysis Orders:  [facility]    4h  B400   82kg  2/2.5 bath  AVF  Heparin  none - last HD 2/03, post wt 85.7kg (+3.7kg) - venofer 100mg  q hd thru 2/24 - mircera 75 mcg IV q 4 wks, last 2/03 given yesterday   Renal-related home meds: - norvasc  5 - rocaltrol 0.25 three times per week - coreg  25 bid - lasix  80 daily - hydralazine  100mg   tid - imdur  90 hs - metolazone 5mg  once weekly  Assessment/Plan: L midfoot diabetic foot ulcer - Followed by Podiatry and [facility]VS. MRI (-) for acute abnormality with no evidence of osteo. S/p angiogram with balloon lithotripsy 2/4 by Dr. Lanis. Results showed bilateral renal arteries patent, no flow-limiting stenosis in the aortoiliac segments bilaterally. Also showed severe small vessel disease in the foot. He is at high risk for limb loss. Now on ASA, Plavix , and Statin. VVS and Podiatry to arrange outpatient follow-up. L arm ecchymosis - [name] of recent infiltration [facility] this week. Noted L AVF placed 12/13/22 by Dr. Lanis. Appears this was only his 3rd HD session. Didn't hear of any issues with HD yesterday. Will continue to watch this very closely in outpatient. If we continue to have issues with cannulation, will refer back to VVS in outpatient. HTN - BP's a bit high at admit but now seems to be improving with HD. Cont home meds. Push UF as tolerated. Euvolemic on exam. ESRD - on HD MWF. Next HD 2/7 in outpatient. 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 Just got esa on 2/03, not due for 4 wks. Follow.  Secondary hyperparathyroidism - Ca and phos in range. Cont binders w/ meals. Albumin low, will consider protein supplements if needed Dispo - Okay for discharge from renal standpoint. He can resume HD tomorrow in outpatient. Will continue to watch his access very closely.  Charmaine Piety, NP [facility] 04/20/2023,12:00 PM  LOS: 0 days

## 2023-04-20 NOTE — Discharge Planning (Signed)
 [facility] Patient Discharge Orders- [facility] CLINIC: [facility]  Patient's name: Samuel Gardner Admit/DC Dates: 04/17/2023 - 04/20/2023  Discharge Diagnoses: PAD/Non-healing left foot ulcer, s/p angiogram with Balloon lithotripsy. Results showed bilateral renal arteries patent, no flow-limiting stenosis in the aortoiliac segments bilaterally. Podiatry and VVS to schedule f/u appts. Now on ASA, Plavix , and statin   Aranesp : Given: No. Recently received ESA at [facility] on 04/17/23 Last Hgb: 8.9 PRBC's Given: No  ESA dose for discharge: mircera 75 mcg IV q 2 weeks  IV Iron dose at discharge: resume Fe load  Heparin  change: No  EDW Change: No   Bath Change: No  Access intervention/Change: No, but review below:  We noted L arm ecchymosis. [name] informed me of recent infiltration at [facility] earlier this week. Noted L AVF placed 12/13/22 by Dr. Lanis. Appears this was only his 3rd/4th HD session. Didn't hear of any of these issues with HD on 2/5 here. We need to continue monitoring his arm very closely in outpatient. Notify the renal team for ongoing issues with cannulation. At that point, will need to refer back to VVS in outpatient.   Hectorol change: No  Discharge Labs: Calcium  8.4 Phosphorus 4.8 Albumin 2.8 K+ 4.5  IV Antibiotics: No  On Coumadin ?: No    D/C Meds to be reconciled by nurse after every discharge.  Completed By: Charmaine Piety, NP   Reviewed by: MD:______ RN_______

## 2023-04-20 NOTE — Progress Notes (Signed)
 Orders received to discharge patient.  Telemetry monitor removed and CCMD notified.  PIV access x1 removed without difficulty.  Discharge instructions, follow up, medications and instructions for their use discussed with patient and wife at bedside.

## 2023-04-20 NOTE — Progress Notes (Signed)
 PHARMACIST LIPID MONITORING   Samuel Gardner is a 53 y.o. male admitted on 04/17/2023 with PAD Nonhealing Ulcer of Left Midfoot.  Pharmacy was consulted to optimize lipid-lowering therapy with the indication of secondary prevention for clinical ASCVD.  Recent Labs:  Lipid Panel (last 6 months):   Lab Results  Component Value Date   CHOL 154 04/20/2023   TRIG 132 04/20/2023   HDL 35 (L) 04/20/2023   CHOLHDL 4.4 04/20/2023   VLDL 26 04/20/2023   LDLCALC 93 04/20/2023    Hepatic function panel (last 6 months):   Lab Results  Component Value Date   AST 12 (L) 04/19/2023   ALT 13 04/19/2023   ALKPHOS 69 04/19/2023   BILITOT 0.3 04/19/2023    SCr (since admission):   Serum creatinine: 4.52 mg/dL (H) 97/93/74 9687 Estimated creatinine clearance: 17.7 mL/min (A)  Current therapy and lipid therapy tolerance The patient had been on atorvastatin  for which he cited he had developed an intolerance secondary to muscle cramping.  Documented or reported allergies or intolerances to lipid-lowering therapies (if applicable): Muscle cramping secondary to atorvastatin .   Assessment:   Patient is excluded from the protocol due to ESRD.  (ESRD, elevated LFTs, pregnancy/breastfeeding, active liver disease). With PAD and progressive disease now status-post revascularization despite prior to admission high dose intensity therapy with atorvastatin  and LDL of 93 mg/dL , goal of therapy is target LDL is <55 mg/dL.   Plan: Patient has been switch per the [facility] Physicians to rosuvastatin  10 mg by mouth, once-daily.    Lynwood KATHEE Lites, PharmD, CPP 04/20/2023, 9:02 AM

## 2023-04-20 NOTE — Telephone Encounter (Signed)
 Left message for pt to call me back to get scheduled for a 2 wk follow up appt for wound care with Dr Sikora.

## 2023-04-20 NOTE — Progress Notes (Addendum)
 [facility]  Subjective  -    Objective 136/81 (!) 59 98.3 F (36.8 C) (Oral) 18 99%  Intake/Output Summary (Last 24 hours) at 04/20/2023 9277 Last data filed at 04/19/2023 2159 Gross per 24 hour  Intake --  Output 2400 ml  Net -2400 ml    Left PT/peroneal doppler signals intact Right peroneal  Groins soft  Lungs non labored breathing No change in left plantar foot wound  Assessment/Planning: POD #1 angiogram with Balloon lithotripsy left tibioperoneal trunk, peroneal artery 2.5 x 80, 5 rounds of 40 pulses and Balloon lithotripsy left tibioperoneal trunk, posterior tibial artery 2.5 x 80, 5 rounds of 40 pulses   On the right: Widely patent common femoral artery, profunda, superficial femoral artery.  Widely patent popliteal artery.  Single-vessel peroneal outflow to the level of the ankle filling the foot through medial lateral perforator branches.  Tibial rod in place. Severe small vessel disease in the foot  He will have to take diligent care of of the right foot secondary to small vessel disease.  If he develops a wound he will be at high risk of limb loss.  Patient seen by [facility] with plans for a 1 week f/u in there office.  Hopefully with improved inflow on the left he will be able to heal the wound.    ASA, Plavix  and Statin for maximum medical management. F/U with VVS sent to our office per Dr. Lanis Maurilio Deland Gerome 04/20/2023 7:22 AM --  Laboratory Lab Results: Recent Labs    04/19/23 0315 04/20/23 0312  WBC 6.2 6.6  HGB 8.2* 8.9*  HCT 26.5* 28.1*  PLT 188 223   BMET Recent Labs    04/19/23 0315 04/20/23 0312  NA 137 134*  K 5.1 4.5  CL 102 97*  CO2 26 26  GLUCOSE 113* 113*  BUN 40* 25*  CREATININE 5.29* 4.52*  CALCIUM  8.1* 8.4*    COAG Lab Results  Component Value Date   INR 1.0 04/17/2023   No results found for: PTT

## 2023-04-20 NOTE — Discharge Summary (Signed)
 Name: Samuel Gardner MRN: [id number] DOB: November 08, 1970 53 y.o. PCP: [name], DO  Date of Admission: 04/17/2023  7:11 PM Date of Discharge: 04/20/2023 04/20/2023 Attending Physician: Dr. CHARLENA Eastern  DISCHARGE DIAGNOSIS:  Primary Problem: Ulcer of foot [facility])   Hospital Problems: Principal Problem:   Ulcer of foot (HCC) Active Problems:   ESRD (end stage renal disease) (HCC)   PAD (peripheral artery disease) (HCC)    DISCHARGE MEDICATIONS:   Allergies as of 04/20/2023       Reactions   Atorvastatin     Muscle cramping        Medication List     STOP taking these medications    atorvastatin  80 MG tablet Commonly known as: LIPITOR   metolazone 5 MG tablet Commonly known as: ZAROXOLYN       TAKE these medications    acetaminophen  500 MG tablet Commonly known as: TYLENOL  Take 1,000 mg by mouth every 6 (six) hours as needed for mild pain (pain score 1-3) or headache.   albuterol  108 (90 Base) MCG/ACT inhaler Commonly known as: VENTOLIN  HFA Inhale 2 puffs into the lungs every 6 (six) hours as needed for wheezing or shortness of breath.   amLODipine  5 MG tablet Commonly known as: NORVASC  Take 1 tablet (5 mg total) by mouth daily.   aspirin  EC 81 MG tablet Take 1 tablet (81 mg total) by mouth daily. Swallow whole. Start taking on: April 21, 2023   calcitRIOL 0.25 MCG capsule Commonly known as: ROCALTROL Take 0.25 mcg by mouth 3 (three) times a week.   carvedilol  25 MG tablet Commonly known as: COREG  TAKE 1 TABLET 2 TIMES DAILY   cholecalciferol 25 MCG (1000 UNIT) tablet Commonly known as: VITAMIN D3 Take 1,000 Units by mouth daily.   clopidogrel  75 MG tablet Commonly known as: PLAVIX  Take 1 tablet (75 mg total) by mouth daily with breakfast. Start taking on: April 21, 2023   ferrous sulfate 325 (65 FE) MG EC tablet Take 325 mg by mouth in the morning and at bedtime.   furosemide  80 MG tablet Commonly known as: LASIX  Take 1 tablet (80 mg  total) by mouth 2 (two) times daily.   hydrALAZINE  100 MG tablet Commonly known as: APRESOLINE  Take 1 tablet (100 mg total) by mouth 3 (three) times daily.   isosorbide  mononitrate 30 MG 24 hr tablet Commonly known as: IMDUR  Take 3 tablets (90 mg total) by mouth daily. What changed: when to take this   Kionex  15 GM/60ML suspension Generic drug: sodium polystyrene Take 15 g by mouth 2 (two) times a week.   nitroGLYCERIN  0.4 MG SL tablet Commonly known as: NITROSTAT  Place 1 tablet (0.4 mg total) under the tongue every 5 (five) minutes as needed for chest pain.   polyethylene glycol powder 17 GM/SCOOP powder Commonly known as: GaviLAX Take 17 g by mouth daily as needed for mild constipation.   rosuvastatin  20 MG tablet Commonly known as: CRESTOR  Take 1 tablet (20 mg total) by mouth daily. Start taking on: April 21, 2023               Discharge Care Instructions  (From admission, onward)           Start     Ordered   04/20/23 0000  Discharge wound care:       Comments: Betadine   04/20/23 1140            DISPOSITION AND FOLLOW-UP:  Samuel Gardner was discharged from  [facility] in Stable condition. At the hospital follow up visit please address:  PAD Nonhealing Ulcer of Left Midfoot Ensure patient has follow-up with vascular/podiatry. Inspect wound.  HLD Started Crestor  20 on discharge in hopes of alleviating cramps associated with Lipitor. Inquire about symptoms and consider increasing to 40 and/or adding Zetia.   Suspected COPD No PFT's on file, consider Pulmonology referral   HOSPITAL COURSE:  Patient Summary: Principal Problem:   Ulcer of foot (HCC) Active Problems:   ESRD (end stage renal disease) (HCC)   PAD (peripheral artery disease) (HCC)  Procedures: Abdominal Aortogram with Lower Extremity, Inpatient Hemodialysis   Consults: Vascular Surgery, Podiatry, Nephrology  Follow-Up Recommendations: - PCP to  follow up on HTN and TII DM management and COPD PFT - Nephrology to follow with ESRD and HD - Podiatry to follow up on nonhealing L foot ulcer and wound care - Vascular follow up on PAD and aortogram post-op   Samuel Gardner is a 53 y.o. male with past medical history of ESRD on HD @ [facility] MWF, HFpEF (60-65% 11/2022), T2DM, and HTN who presented with one day history of left arm pain with associated ecchymosis/swelling and L inferolateral midfoot wound and admitted for non-healing diabetic foot ulcer of left midfoot.   ESRD Left Forearm Ecchymosis  Anemia of Chronic Disease Patient presented with moderate pain, swelling and bruising one day after receiving dialysis. His AV fistula was created on 12/13/22. Vascular evaluated and attributed presentation with infiltration. Patient received dialysis through the fistula without complications during admission. AVF continued to have palpable thrill prior to and after receiving dialysis. Patient's Hgb was stable ranging between 8.2-8.9. Nephrology consulted for inpatient HD. They reported he received ESA on 2/03 and is not due for 4 weeks. Screening colonoscopy 1/28 showed 1 pre-cancerous and 3 benign polyps that were removed. Recommend continued follow up with nephrology for ESRD and HD.  PAD Nonhealing Ulcer of Left Midfoot On admission, patient was found to have a L inferolateral midfoot wound although this was not the patient's chief complaint. He follows with podiatry who diagnosed this ulcer as a diabetic ulcer with exposed fat pad and debrided at 1/20. On exam, wound was non-draining and central part of ulcer covered with tissue consistent with beginning stages of eschar. Vascular Surgery evaluated. ABI's on admission limited in setting of arterial wall calcification but results noted non-compressible right lower extremity arteries with abnormal right and left toe-brachial index.  Doppler waveforms at left ankle suggestive of arterial  occlusive disease. Given these results, Vascular Surgery performed an abdominal aortogram with balloon lithotripsy on left leg. A1c (5.4% 03/2023) and LDL this admission was 93. Patient remained afebrile with no leukocytosis during admission. Podiatry evaluated and recommended wound care with betadine and DSD daily to wound and will follow-up outpatient in 1-2 weeks. Vascular Surgery to contact patient about scheduling appointment for PAD and post-operative follow up. Patient discharged on ASA and Plavix  per Vascular team.   HTN Hypertensive on admission from 150's-180's. Nephrology recommended restarting home meds of Amlodipine  5 mg/daily, Imdur  90 mg/daily, Coreg  25 mg BID and Hydralazine  100 mg TID. BP improved during admission and with HD. Normotensive at discharge. Patient continued on these medications at discharge. Recommend follow up with PCP on HTN management.  HLD LDL 03/2023 was 94. Patient previously prescribed Lipitor but stopped taking it 2/2 cramping. Started 10 mg Crestor  as it is hypothesized that Crestor  may be less likely to cause muscular side effects. Patient  discharged on 20 mg given no reports of muscle cramping with 10 mg. Patient advised to follow with PCP for HLD medication management. If patient experiences muscle cramps, may consider decreasing Crestor  to 10 mg and adding Zetia.   HFpEF Home lasix  80 mg was given for one dose on admission. Held additional doses as patient was scheduled to receive dialysis. No signs of volume overload at discharge. Home lasix  80 mg daily restarted on discharge.   T2DM A1c 5.4% 03/2023. Well controlled without medications. Recommend outpatient follow up with PCP.  Suspected COPD No PFT's on file, but it appears this test has been ordered. Patient resumed on home albuterol  PRN. Oxygen saturations stable during admission and on discharge. Recommend PCP to follow up on PFT.    DISCHARGE INSTRUCTIONS:   Discharge Instructions     Call MD for:   difficulty breathing, headache or visual disturbances   Complete by: As directed    Call MD for:  extreme fatigue   Complete by: As directed    Call MD for:  hives   Complete by: As directed    Call MD for:  persistant dizziness or light-headedness   Complete by: As directed    Call MD for:  persistant nausea and vomiting   Complete by: As directed    Call MD for:  redness, tenderness, or signs of infection (pain, swelling, redness, odor or green/yellow discharge around incision site)   Complete by: As directed    Call MD for:  severe uncontrolled pain   Complete by: As directed    Call MD for:  temperature >100.4   Complete by: As directed    Diet - low sodium heart healthy   Complete by: As directed    Discharge wound care:   Complete by: As directed    Betadine   Betadine, [place] Dressing daily to wound   Increase activity slowly   Complete by: As directed        SUBJECTIVE:  No complaints/concerns. Comfortable with discharge Discharge Vitals:   BP 128/72 (BP Location: Right Arm)   Pulse (!) 59   Temp 98.1 F (36.7 C) (Oral)   Resp 19   Ht 5' 7 (1.702 m)   Wt 79.2 kg Comment: standing  SpO2 100%   BMI 27.35 kg/m   OBJECTIVE:  Constitutional: well-appearing, lying in bed, in no acute distress Cardiovascular: regular rate and rhythm, no m/r/g, no LEE Pulmonary/Chest: normal work of breathing on room air, lungs clear to auscultation bilaterally Abdominal: soft, non-tender, non-distended MSK: normal bulk and tone Skin: palpable thrill on left AVF (swelling/ecchymoses improved), left foot wound covered with dressing Neurological: sensation in all distal extremities  Pertinent Labs, Studies, and Procedures:     Latest Ref Rng & Units 04/20/2023    3:12 AM 04/19/2023    3:15 AM 04/17/2023    7:39 PM  CBC  WBC 4.0 - 10.5 K/uL 6.6  6.2  7.3   Hemoglobin 13.0 - 17.0 g/dL 8.9  8.2  8.7   Hematocrit 39.0 - 52.0 % 28.1  26.5  27.6   Platelets 150 - 400 K/uL 223  188  213         Latest Ref Rng & Units 04/20/2023    3:12 AM 04/19/2023    3:15 AM 04/17/2023    7:39 PM  CMP  Glucose 70 - 99 mg/dL 886  886  872   BUN 6 - 20 mg/dL 25  40  32   Creatinine  0.61 - 1.24 mg/dL 5.47  4.70  6.25   Sodium 135 - 145 mmol/L 134  137  142   Potassium 3.5 - 5.1 mmol/L 4.5  5.1  4.2   Chloride 98 - 111 mmol/L 97  102  101   CO2 22 - 32 mmol/L 26  26  29    Calcium  8.9 - 10.3 mg/dL 8.4  8.1  8.2   Total Protein 6.5 - 8.1 g/dL  5.8    Total Bilirubin 0.0 - 1.2 mg/dL  0.3    Alkaline Phos 38 - 126 U/L  69    AST 15 - 41 U/L  12    ALT 0 - 44 U/L  13      MR FOOT LEFT WO CONTRAST Result Date: 04/18/2023 CLINICAL DATA:  Left great toe pain and ulcer. EXAM: MRI OF THE LEFT FOOT WITHOUT CONTRAST TECHNIQUE: Multiplanar, multisequence MR imaging of the left forefoot was performed. No intravenous contrast was administered. COMPARISON:  None Available. FINDINGS: Bones/Joint/Cartilage No marrow signal abnormality. No fracture or dislocation. Mild degenerative changes of the first MTP joint. No joint effusion. Ligaments Collateral ligaments are intact. Muscles and Tendons Flexor and extensor tendons are intact. No tenosynovitis. Increased T2 signal within the intrinsic muscles of the forefoot, nonspecific, but likely related to diabetic muscle changes. Soft tissue Mild dorsal forefoot soft tissue swelling. No fluid collection or hematoma. No soft tissue mass. IMPRESSION: 1. No acute abnormality. No evidence of osteomyelitis. Electronically Signed   By: Elsie ONEIDA Shoulder M.D.   On: 04/18/2023 21:26   VAS US  ABI WITH/WO TBI Result Date: 04/18/2023  LOWER EXTREMITY DOPPLER STUDY Patient Name:  Samuel Gardner  Date of Exam:   04/18/2023 Medical Rec #: [id number]          Accession #:    [id number] Date of Birth: 05/21/1970           Patient Gender: M Patient Age:   89 years Exam Location:  [facility] Procedure:      VAS US  ABI WITH/WO TBI Referring Phys: AMJAD ALI  --------------------------------------------------------------------------------  Indications: Ulceration. High Risk Factors: Hypertension, Diabetes.  Comparison Study: None. Performing Technologist: Garnette Rockers  Examination Guidelines: A complete evaluation includes at minimum, Doppler waveform signals and systolic blood pressure reading at the level of bilateral brachial, anterior tibial, and posterior tibial arteries, when vessel segments are accessible. Bilateral testing is considered an integral part of a complete examination. Photoelectric Plethysmograph (PPG) waveforms and toe systolic pressure readings are included as required and additional duplex testing as needed. Limited examinations for reoccurring indications may be performed as noted.  ABI Findings: +---------+------------------+-----+-------------------+----------------+ Right    Rt Pressure (mmHg)IndexWaveform           Comment          +---------+------------------+-----+-------------------+----------------+ Brachial 202                    triphasic                           +---------+------------------+-----+-------------------+----------------+ PTA                             dampened monophasicNon-compressible +---------+------------------+-----+-------------------+----------------+ DP                              dampened monophasicNon-compressible +---------+------------------+-----+-------------------+----------------+ Samuel Gardner  0.11 Abnormal                            +---------+------------------+-----+-------------------+----------------+ +---------+------------------+-----+-------------------+----------------------+ Left     Lt Pressure (mmHg)IndexWaveform           Comment                +---------+------------------+-----+-------------------+----------------------+ Brachial                                           Contraindicated by AVF  +---------+------------------+-----+-------------------+----------------------+ PTA      196               0.97 dampened monophasic                       +---------+------------------+-----+-------------------+----------------------+ DP       160               0.79 dampened monophasic                       +---------+------------------+-----+-------------------+----------------------+ Great Toe50                0.25 Abnormal                                  +---------+------------------+-----+-------------------+----------------------+ +-------+-----------+-----------+------------+------------+ ABI/TBIToday's ABIToday's TBIPrevious ABIPrevious TBI +-------+-----------+-----------+------------+------------+ Right  [place]         0.11                                +-------+-----------+-----------+------------+------------+ Left   0.97       0.25                                +-------+-----------+-----------+------------+------------+  Arterial wall calcification precludes accurate ankle pressures and ABIs.  Summary: Right: Resting right ankle-brachial index indicates noncompressible right lower extremity arteries. The right toe-brachial index is abnormal. Left: Resting left ankle-brachial index is within normal range. The left toe-brachial index is abnormal. Although ankle brachial indices are within normal limits (0.95-1.29), arterial Doppler waveforms at the ankle suggest some component of arterial occlusive disease. *See table(s) above for measurements and observations.  Electronically signed by Norman Serve on 04/18/2023 at 1:53:22 PM.    Final      Signed: Norman Lobstein, DO  Internal Medicine Resident, PGY-1 [facility]  Pager: [phone] 1:03 PM, 04/20/2023

## 2023-04-21 ENCOUNTER — Encounter (HOSPITAL_COMMUNITY): Payer: Self-pay | Admitting: Vascular Surgery

## 2023-04-21 ENCOUNTER — Telehealth: Payer: Self-pay

## 2023-04-21 DIAGNOSIS — L97529 Non-pressure chronic ulcer of other part of left foot with unspecified severity: Secondary | ICD-10-CM | POA: Insufficient documentation

## 2023-04-21 MED FILL — Hydralazine HCl Inj 20 MG/ML: INTRAMUSCULAR | Qty: 1 | Status: AC

## 2023-04-21 NOTE — Transitions of Care (Post Inpatient/ED Visit) (Signed)
 04/21/2023  Name: Samuel Gardner MRN: [id number] DOB: 1970/09/13  Today's TOC FU Call Status: Today's TOC FU Call Status:: Successful TOC FU Call Completed TOC FU Call Complete Date: 04/21/23 Patient's Name and Date of Birth confirmed.  Transition Care Management Follow-up Telephone Call Date of Discharge: 05/02/23 Discharge Facility: [facility]) Type of Discharge: Emergency Department Reason for ED Visit: Other: (ESRD) How have you been since you were released from the hospital?: Better Any questions or concerns?: No  Items Reviewed: Did you receive and understand the discharge instructions provided?: Yes Medications obtained,verified, and reconciled?: Yes (Medications Reviewed) Any new allergies since your discharge?: No Dietary orders reviewed?: Yes Do you have support at home?: Yes People in Home: spouse  Medications Reviewed Today: Medications Reviewed Today     Reviewed by Emmitt Pan, LPN (Licensed Practical Nurse) on 04/21/23 at 1118  Med List Status: <None>   Medication Order Taking? Sig Documenting Provider Last Dose Status Informant  acetaminophen  (TYLENOL ) 500 MG tablet [id number] Yes Take 1,000 mg by mouth every 6 (six) hours as needed for mild pain (pain score 1-3) or headache. [provider] Taking Active Spouse/Significant Other, Pharmacy Records  albuterol  (VENTOLIN  HFA) 108 (90 Base) MCG/ACT inhaler [id number] Yes Inhale 2 puffs into the lungs every 6 (six) hours as needed for wheezing or shortness of breath. Trixie Nilda HERO, MD Taking Active Spouse/Significant Other, Pharmacy Records           Med Note LUNA, [facility] D   Tue Apr 18, 2023 12:37 PM) Unknown last dose  amLODipine  (NORVASC ) 5 MG tablet [id number] Yes Take 1 tablet (5 mg total) by mouth daily. [name], [name], MD Taking Active Spouse/Significant Other, Pharmacy Records  aspirin  EC 81 MG tablet [id number] Yes Take 1 tablet (81 mg total) by mouth daily. Swallow whole. Marylu Gee, DO Taking Active   calcitRIOL (ROCALTROL) 0.25 MCG capsule [id number] No Take 0.25 mcg by mouth 3 (three) times a week.  Patient not taking: Reported on 04/21/2023   [provider] Not Taking Active Spouse/Significant Other, Pharmacy Records           Med Note LUNA [facility] D   Tue Apr 18, 2023 12:38 PM) May receive at dialysis  carvedilol  (COREG ) 25 MG tablet [id number] Yes TAKE 1 TABLET 2 TIMES DAILY [name], [name], MD Taking Active Spouse/Significant Other, Pharmacy Records  cholecalciferol (VITAMIN D3) 25 MCG (1000 UNIT) tablet [id number] No Take 1,000 Units by mouth daily.  Patient not taking: Reported on 04/21/2023   [provider] Not Taking Active Spouse/Significant Other, Pharmacy Records           Med Note LUNA [facility] D   Tue Apr 18, 2023 12:42 PM) May receive at dialysis  clopidogrel  (PLAVIX ) 75 MG tablet [id number] Yes Take 1 tablet (75 mg total) by mouth daily with breakfast. Marylu Gee, DO Taking Active   ferrous sulfate 325 (65 FE) MG EC tablet [id number] No Take 325 mg by mouth in the morning and at bedtime.  Patient not taking: Reported on 04/21/2023   [provider] Not Taking Active Spouse/Significant Other, Pharmacy Records           Med Note LUNA [facility] D   Tue Apr 18, 2023 12:42 PM) May receive at dialysis  furosemide  (LASIX ) 80 MG tablet [id number] Yes Take 1 tablet (80 mg total) by mouth 2 (two) times daily. [name], DO Taking Active Spouse/Significant Other, Pharmacy Records  hydrALAZINE  (APRESOLINE ) 100 MG tablet [id number] Yes  Take 1 tablet (100 mg total) by mouth 3 (three) times daily. [name], MD Taking Active Spouse/Significant Other, Pharmacy Records  isosorbide  mononitrate (IMDUR ) 30 MG 24 hr tablet [id number] Yes Take 3 tablets (90 mg total) by mouth daily.  Patient taking differently: Take 90 mg by mouth at bedtime.   Santo Stanly LABOR, MD Taking Active Spouse/Significant Other, Pharmacy Records  KIONEX   15 GM/60ML suspension [id number] No Take 15 g by mouth 2 (two) times a week.  Patient not taking: Reported on 04/21/2023   [provider] Not Taking Active Spouse/Significant Other, Pharmacy Records           Med Note LUNA, [facility] D   Tue Apr 18, 2023 12:42 PM) May receive at dialysis  nitroGLYCERIN  (NITROSTAT ) 0.4 MG SL tablet [id number] Yes Place 1 tablet (0.4 mg total) under the tongue every 5 (five) minutes as needed for chest pain. Lucien Orren SAILOR, [name] Taking Active Spouse/Significant Other, Pharmacy Records           Med Note LUNA, [facility] D   Tue Apr 18, 2023 12:39 PM) Never had to take  polyethylene glycol powder (GAVILAX) 17 GM/SCOOP powder [id number] Yes Take 17 g by mouth daily as needed for mild constipation. [name], DO Taking Active Spouse/Significant Other, Pharmacy Records           Med Note LUNA, [facility] D   Tue Apr 18, 2023 12:40 PM) Unknown last dose  rosuvastatin  (CRESTOR ) 20 MG tablet [id number] Yes Take 1 tablet (20 mg total) by mouth daily. Marylu Gee, DO Taking Active             Home Care and Equipment/Supplies: Were Home Health Services Ordered?: NA Any new equipment or medical supplies ordered?: NA  Functional Questionnaire: Do you need assistance with bathing/showering or dressing?: No Do you need assistance with meal preparation?: No Do you need assistance with eating?: No Do you have difficulty maintaining continence: No Do you need assistance with getting out of bed/getting out of a chair/moving?: No Do you have difficulty managing or taking your medications?: No  Follow up appointments reviewed: PCP Follow-up appointment confirmed?: Yes Date of PCP follow-up appointment?: 05/02/23 Follow-up Provider: [facility] Follow-up appointment confirmed?: Yes Date of Specialist follow-up appointment?: 04/21/23 Follow-Up Specialty Provider:: nephro Do you need transportation to your follow-up appointment?: No Do you  understand care options if your condition(s) worsen?: Yes-patient verbalized understanding    SIGNATURE Julian Lemmings, LPN [facility] Nurse Health Advisor Direct Dial [phone]

## 2023-04-21 NOTE — TOC Transition Note (Signed)
 Transition of Care - Initial Contact from Inpatient Facility  Date of discharge: 04/20/23 Date of contact: 04/21/23 Method: Attempted Phone Call Spoke to: No Answer  Patient contacted to discuss transition of care from recent inpatient hospitalization but he didn't answer the phone.  Patient scheduled for HD today. Next HD 2/10 at [facility].  Charmaine Piety, NP

## 2023-04-25 ENCOUNTER — Encounter (HOSPITAL_COMMUNITY): Payer: Self-pay

## 2023-04-25 ENCOUNTER — Telehealth: Payer: Self-pay | Admitting: Podiatry

## 2023-04-25 ENCOUNTER — Ambulatory Visit: Payer: Managed Care, Other (non HMO) | Attending: Physician Assistant | Admitting: Physician Assistant

## 2023-04-25 ENCOUNTER — Encounter: Payer: Self-pay | Admitting: Physician Assistant

## 2023-04-25 VITALS — BP 118/70 | HR 65 | Resp 16 | Ht 67.0 in | Wt 187.6 lb

## 2023-04-25 DIAGNOSIS — E782 Mixed hyperlipidemia: Secondary | ICD-10-CM | POA: Diagnosis not present

## 2023-04-25 DIAGNOSIS — N185 Chronic kidney disease, stage 5: Secondary | ICD-10-CM

## 2023-04-25 DIAGNOSIS — I739 Peripheral vascular disease, unspecified: Secondary | ICD-10-CM

## 2023-04-25 DIAGNOSIS — I2510 Atherosclerotic heart disease of native coronary artery without angina pectoris: Secondary | ICD-10-CM | POA: Diagnosis not present

## 2023-04-25 DIAGNOSIS — I5032 Chronic diastolic (congestive) heart failure: Secondary | ICD-10-CM | POA: Diagnosis not present

## 2023-04-25 DIAGNOSIS — I10 Essential (primary) hypertension: Secondary | ICD-10-CM

## 2023-04-25 NOTE — Telephone Encounter (Signed)
Called pts wife to get scheduled for 1 wk appt with Dr Ralene Cork and she said he was scheduled for 2/25 already. ( Dr Annamary Rummage said 1 wk) PT can only come on Tuesdays and Thursdays due to dialysis and Dr Ralene Cork is not in [facility] Thursdays. Tried for next Tuesday but pt already has 2 appts. They are keeping the appt on 2/25

## 2023-04-25 NOTE — Patient Instructions (Addendum)
Medication Instructions:  Your physician recommends that you continue on your current medications as directed. Please refer to the Current Medication list given to you today.  *If you need a refill on your cardiac medications before your next appointment, please call your pharmacy*  Lab Work: In 3 months (around Jul 24, 2023): fasting Lipid panel You may go to any of these [facility] locations:   [facility] - [address] ([facility]) - [address] [phone] [address]. [address]   [place] - [address]    [place]  - [address]    [place] - [address] - [address] [employer]  - [address] ([facility]   If you have labs (blood work) drawn today and your tests are completely normal, you will receive your results only by: [employer] (if you have MyChart) OR A paper copy in the mail If you have any lab test that is abnormal or we need to change your treatment, we will call you to review the results.  Follow-Up: At [facility], you and your health needs are our priority.  As part of our continuing mission to provide you with exceptional heart care, we have created designated Provider Care Teams.  These Care Teams include your primary Cardiologist (physician) and Advanced Practice Providers (APPs -  Physician Assistants and Nurse Practitioners) who all work together to provide you with the care you need, when you need it.  Your next appointment:   6 month(s)  The format for your next appointment:   In Person  Provider:   Armanda Magic, MD {  Other Instructions   1st Floor: - Lobby - Registration  - Pharmacy  - Lab - Cafe  2nd Floor: - PV Lab - Diagnostic Testing (echo, CT, nuclear med)  3rd Floor: - Vacant  4th Floor: - TCTS (cardiothoracic surgery) - AFib Clinic - Structural Heart Clinic - Vascular Surgery   - Vascular Ultrasound  5th Floor: - [facility] (general and EP) - Clinical Pharmacy for coumadin, hypertension, lipid, weight-loss medications, and med management appointments    Valet parking services will be available as well.

## 2023-04-26 DIAGNOSIS — L97929 Non-pressure chronic ulcer of unspecified part of left lower leg with unspecified severity: Secondary | ICD-10-CM | POA: Diagnosis present

## 2023-05-01 ENCOUNTER — Encounter: Payer: Managed Care, Other (non HMO) | Admitting: Student

## 2023-05-02 ENCOUNTER — Ambulatory Visit: Payer: Managed Care, Other (non HMO) | Admitting: Student

## 2023-05-02 VITALS — BP 152/65 | HR 58 | Temp 98.3°F | Ht 67.0 in | Wt 183.7 lb

## 2023-05-02 DIAGNOSIS — N186 End stage renal disease: Secondary | ICD-10-CM | POA: Diagnosis not present

## 2023-05-02 DIAGNOSIS — I739 Peripheral vascular disease, unspecified: Secondary | ICD-10-CM

## 2023-05-02 DIAGNOSIS — R0609 Other forms of dyspnea: Secondary | ICD-10-CM

## 2023-05-02 MED ORDER — ROSUVASTATIN CALCIUM 20 MG PO TABS
20.0000 mg | ORAL_TABLET | Freq: Every day | ORAL | 3 refills | Status: DC
Start: 2023-05-02 — End: 2023-08-17

## 2023-05-02 MED ORDER — ASPIRIN 81 MG PO TBEC: 81.0000 mg | DELAYED_RELEASE_TABLET | Freq: Every day | ORAL | 0 refills | Status: DC

## 2023-05-02 MED ORDER — CLOPIDOGREL BISULFATE 75 MG PO TABS: 75.0000 mg | ORAL_TABLET | Freq: Every day | ORAL | 0 refills | Status: DC

## 2023-05-02 NOTE — Progress Notes (Unsigned)
   CC: Hospital Follow Up after admission from 04/17/2023 to 04/20/2023 for nonhealing ulcer of left foot  HPI:  Samuel Gardner is a 53 y.o. male with pertinent PMH of ESRD on HD MWF, HFpEF, HTN, anemia of chronic disease, and PAD with left lower extremity ulcer who presents as above. Please see assessment and plan below for further details.  Review of Systems:   Pertinent items noted in HPI and/or A&P.  Physical Exam:  Vitals:   05/02/23 1043 05/02/23 1120  BP: (!) 153/73 (!) 152/65  Pulse: (!) 59 (!) 58  Temp: 98.3 F (36.8 C)   TempSrc: Oral   SpO2: 100%   Weight: 183 lb 11.2 oz (83.3 kg)   Height: 5\' 7"  (1.702 m)     Constitutional: Well-appearing adult male. In no acute distress. HEENT: Normocephalic, atraumatic, Sclera non-icteric, PERRL, EOM intact Cardio:Regular rate and rhythm. 2+ bilateral radial pulses.  Bruit over left upper extremity AV fistula. Pulm:Clear to auscultation bilaterally. Normal work of breathing on room air. Abdomen: Soft, non-tender, non-distended, positive bowel sounds. ZOX:WRUEAVWU for extremity edema.  Large arterial wound on the lateral left foot that appears to be healing well without signs of infection Skin:Warm and dry. Neuro:Alert and oriented x3. No focal deficit noted. Psych:Pleasant mood and affect.   Assessment & Plan:   PAD (peripheral artery disease) [facility]) Patient presents for hospital follow-up for left lower foot wound with poor healing due to PAD.  Since discharge he and his wife have been taking care of the wound with daily bandage changes and think that the wound is improving.  Today it does look clean and appears to be healing well.  Discussed with the patient the decision between DAPT and DOAC for patients with PAD.  Since they are seeing the vascular surgeon later this week I recommended that they discuss this with them and see if he would benefit from anticoagulation. - Continue DAPT for now and discussed with vascular surgery  the option of anticoagulation  Dyspnea Patient has significant history of smoking but quit about 14 months ago.  He has fluid dependent dyspnea that improves with dialysis but also baseline dyspnea with signs of lung damage on imaging.  He has follow-up with pulmonology on 05/18/2023.  He also has trouble with his albuterol inhaler and would benefit from spacer.  ESRD (end stage renal disease) (HCC) Begin dialysis this month after creation of left upper extremity AV fistula.  He is on a Monday Wednesday Friday schedule and doing well.  He will continue on the schedule and follow-up with vascular surgery for both his peripheral artery disease and left upper extremity AV fistula.    Patient discussed with  Dr. Virl Cagey, DO [facility] Internal Medicine Resident PGY-2 Clinic Phone: [phone] Pager: [phone]

## 2023-05-02 NOTE — Patient Instructions (Signed)
  Thank you, [name], for allowing [place] to provide your care today.  I am glad that your wound is getting better and that you have very good follow-ups coming up.  I would like you to talk to your vascular surgeon about anticoagulation such as Xarelto or Eliquis versus dual antiplatelet therapy (aspirin and Plavix) to reduce your risk of complications from your peripheral artery disease.  I will also get a spacer sent in so the inhaler is easier to use at home.  I am glad you have a follow-up with pulmonology soon.  I will fill your FMLA paperwork this week and the front office will you know whenever it is ready.  Follow up: 4-6 months    Remember:     Should you have any questions or concerns please call the internal medicine clinic at [phone].     Rocky Morel, DO [facility]

## 2023-05-03 ENCOUNTER — Encounter (HOSPITAL_COMMUNITY): Payer: Self-pay

## 2023-05-03 MED ORDER — PRO COMFORT SPACER ADULT MISC: 0 refills | Status: AC

## 2023-05-03 NOTE — Addendum Note (Signed)
Addended by: Rocky Morel on: 05/03/2023 07:46 AM   Modules accepted: Level of Service

## 2023-05-03 NOTE — Assessment & Plan Note (Signed)
Patient has significant history of smoking but quit about 14 months ago.  He has fluid dependent dyspnea that improves with dialysis but also baseline dyspnea with signs of lung damage on imaging.  He has follow-up with pulmonology on 05/18/2023.  He also has trouble with his albuterol inhaler and would benefit from spacer.

## 2023-05-03 NOTE — Assessment & Plan Note (Signed)
Patient presents for hospital follow-up for left lower foot wound with poor healing due to PAD.  Since discharge he and his wife have been taking care of the wound with daily bandage changes and think that the wound is improving.  Today it does look clean and appears to be healing well.  Discussed with the patient the decision between DAPT and DOAC for patients with PAD.  Since they are seeing the vascular surgeon later this week I recommended that they discuss this with them and see if he would benefit from anticoagulation. - Continue DAPT for now and discussed with vascular surgery the option of anticoagulation

## 2023-05-03 NOTE — Progress Notes (Signed)
 Internal Medicine Clinic Attending  Case discussed with the resident at the time of the visit.  We reviewed the resident's history and exam and pertinent patient test results.  I agree with the assessment, diagnosis, and plan of care documented in the resident's note.

## 2023-05-03 NOTE — Assessment & Plan Note (Signed)
Begin dialysis this month after creation of left upper extremity AV fistula.  He is on a Monday Wednesday Friday schedule and doing well.  He will continue on the schedule and follow-up with vascular surgery for both his peripheral artery disease and left upper extremity AV fistula.

## 2023-05-03 NOTE — Progress Notes (Signed)
.  attest

## 2023-05-04 ENCOUNTER — Other Ambulatory Visit: Payer: Self-pay

## 2023-05-04 MED ORDER — AMLODIPINE BESYLATE 5 MG PO TABS: 5.0000 mg | ORAL_TABLET | Freq: Every day | ORAL | 3 refills | Status: DC

## 2023-05-04 NOTE — Telephone Encounter (Signed)
 Medication sent to pharmacy

## 2023-05-05 ENCOUNTER — Telehealth: Payer: Self-pay

## 2023-05-05 NOTE — Telephone Encounter (Signed)
Medication Management: -in person triage request submitted by pt stating his PCP wanted him to ask [facility] about changing from Plavix/ASA to Xarelto or Eliquis.  -Dr. Karin Lieu consulted and stated the Plavix /ASA is fine

## 2023-05-09 ENCOUNTER — Encounter: Payer: Self-pay | Admitting: Podiatry

## 2023-05-09 ENCOUNTER — Ambulatory Visit: Payer: Managed Care, Other (non HMO) | Admitting: Podiatry

## 2023-05-09 DIAGNOSIS — E11621 Type 2 diabetes mellitus with foot ulcer: Secondary | ICD-10-CM

## 2023-05-09 DIAGNOSIS — L97422 Non-pressure chronic ulcer of left heel and midfoot with fat layer exposed: Secondary | ICD-10-CM | POA: Diagnosis not present

## 2023-05-09 NOTE — Progress Notes (Signed)
  Subjective:  Patient ID: Samuel Gardner, male    DOB: 10-04-1970,   MRN: [id number]  No chief complaint on file.   53 y.o. male presents for follow-up of left foot wound. He was recently hospitalized and underwent successful revascularization. Does have some small vessel disease of the foot. Relates feeling much better and the spot on the side of his foot improved. Relates he did stop wearing the surgical shoe and has been in regular shoes.  Patient is diabetic and last A1c was  Lab Results  Component Value Date   HGBA1C 6.1 (H) 11/11/2022   .   PCP:  Rocky Morel, DO    . Denies any other pedal complaints. Denies n/v/f/c.   Past Medical History:  Diagnosis Date   (HFpEF) heart failure with preserved ejection fraction (HCC)    Ascending aorta dilatation (HCC)    40mm by echo 11/2022   CKD (chronic kidney disease), stage IV (HCC)    Diabetes mellitus without complication (HCC)    Hypertension    Mitral regurgitation    mild by echo 11/2022   Normocytic anemia     Objective:  Physical Exam: Vascular: DP/PT pulses 2/4 bilateral. CFT <3 seconds. Absent hair growth on digits. Edema noted to bilateral lower extremities. Xerosis noted bilaterally.  Skin. No lacerations or abrasions bilateral feet. Nails 1-5 bilateral  are normal in appearance. Ulceration noted to lateral plantar left foot with necrotic fibrinous base. Measures about 2.5 cm x 2 cm x 0.2 cm. No erythema edema or probe to bone. Bleeding noted to wound bed.  Musculoskeletal: MMT 5/5 bilateral lower extremities in DF, PF, Inversion and Eversion. Deceased ROM in DF of ankle joint.  Neurological: Sensation intact to light touch. Protective sensation diminished bilateral.    Assessment:   1. Diabetic ulcer of left midfoot associated with type 2 diabetes mellitus, with fat layer exposed (HCC)       Plan:  Patient was evaluated and treated and all questions answered. Ulcer lateral left foot with fat layer exposed   -Debridement as below. -Dressed with betadine, DSD. -Off-loading with surgical shoe. Advised to continue in surgical shoe. Was wearing regular tennis shoes today.  -Advised if we struggle with healing will consider wound care consult and HBO therapy.  -Discussed glucose control and proper protein-rich diet.  -Discussed if any worsening redness, pain, fever or chills to call or may need to report to the emergency room. Patient expressed understanding.   Procedure: Excisional Debridement of Wound Rationale: Removal of non-viable soft tissue from the wound to promote healing.  Anesthesia: none Pre-Debridement Wound Measurements: Overlying blister  Post-Debridement Wound Measurements: 2.5 cm x 2 cm x 0.2 cm  Type of Debridement: Sharp Excisional Tissue Removed: Non-viable soft tissue Depth of Debridement: subcutaneous tissue. Technique: Sharp excisional debridement to bleeding, viable wound base.  Dressing: Dry, sterile, compression dressing. Disposition: Patient tolerated procedure well. Patient to return in 2 week for follow-up.  Return in about 2 weeks (around 05/23/2023) for wound check.    Louann Sjogren, DPM

## 2023-05-18 ENCOUNTER — Encounter: Payer: Self-pay | Admitting: Podiatry

## 2023-05-18 ENCOUNTER — Encounter: Payer: Self-pay | Admitting: Student

## 2023-05-18 NOTE — Telephone Encounter (Signed)
 Called and spoke with pt's wife Samuel Gardner) in regards FMLA paperwork. Paperwork has been completed and faxed back to [phone]. Per patient's wife she would like the original FMLA forms to be mail to her home address.

## 2023-05-23 ENCOUNTER — Encounter: Payer: Self-pay | Admitting: Podiatry

## 2023-05-23 ENCOUNTER — Ambulatory Visit: Payer: Managed Care, Other (non HMO) | Admitting: Podiatry

## 2023-05-23 DIAGNOSIS — E11621 Type 2 diabetes mellitus with foot ulcer: Secondary | ICD-10-CM | POA: Diagnosis not present

## 2023-05-23 DIAGNOSIS — L97422 Non-pressure chronic ulcer of left heel and midfoot with fat layer exposed: Secondary | ICD-10-CM

## 2023-05-23 DIAGNOSIS — L97511 Non-pressure chronic ulcer of other part of right foot limited to breakdown of skin: Secondary | ICD-10-CM | POA: Diagnosis not present
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
## 2023-05-24 ENCOUNTER — Other Ambulatory Visit: Payer: Self-pay

## 2023-05-24 DIAGNOSIS — I739 Peripheral vascular disease, unspecified: Secondary | ICD-10-CM

## 2023-05-31 DIAGNOSIS — E46 Unspecified protein-calorie malnutrition: Secondary | ICD-10-CM | POA: Insufficient documentation

## 2023-06-01 ENCOUNTER — Other Ambulatory Visit: Payer: Self-pay

## 2023-06-01 ENCOUNTER — Ambulatory Visit (INDEPENDENT_AMBULATORY_CARE_PROVIDER_SITE_OTHER): Payer: Managed Care, Other (non HMO) | Admitting: Physician Assistant

## 2023-06-01 ENCOUNTER — Ambulatory Visit (HOSPITAL_COMMUNITY)
Admission: RE | Admit: 2023-06-01 | Discharge: 2023-06-01 | Disposition: A | Discharge status: Home / Self Care | Payer: Managed Care, Other (non HMO) | Source: Ambulatory Visit | Attending: Vascular Surgery | Admitting: Vascular Surgery

## 2023-06-01 VITALS — BP 193/89 | HR 70 | Temp 99.2°F | Ht 67.0 in | Wt 185.4 lb

## 2023-06-01 DIAGNOSIS — Z992 Dependence on renal dialysis: Secondary | ICD-10-CM

## 2023-06-01 DIAGNOSIS — N186 End stage renal disease: Secondary | ICD-10-CM | POA: Diagnosis not present

## 2023-06-01 DIAGNOSIS — I70235 Atherosclerosis of native arteries of right leg with ulceration of other part of foot: Secondary | ICD-10-CM

## 2023-06-01 DIAGNOSIS — I739 Peripheral vascular disease, unspecified: Secondary | ICD-10-CM

## 2023-06-01 LAB — VAS US ABI WITH/WO TBI
Left ABI: 0.71
Right ABI: 1.24
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
## 2023-06-06 ENCOUNTER — Ambulatory Visit: Admitting: Podiatry

## 2023-06-06 ENCOUNTER — Encounter: Payer: Self-pay | Admitting: Podiatry

## 2023-06-06 DIAGNOSIS — L97422 Non-pressure chronic ulcer of left heel and midfoot with fat layer exposed: Secondary | ICD-10-CM

## 2023-06-06 DIAGNOSIS — E11621 Type 2 diabetes mellitus with foot ulcer: Secondary | ICD-10-CM

## 2023-06-06 DIAGNOSIS — L97511 Non-pressure chronic ulcer of other part of right foot limited to breakdown of skin: Secondary | ICD-10-CM

## 2023-06-06 NOTE — Progress Notes (Signed)
  Subjective:  Patient ID: Samuel Gardner, male    DOB: January 25, 1971,   MRN: [id number]  No chief complaint on file.   53 y.o. male presents for follow-up of left foot wound. Relates doing well. Had revascularization and found to have small vessel disease. He has another aortogram tomorrow for the right leg.  Has been in the surgical shoe. Relates wounds doing the same. Has follow-up with wound care next Tuesday.  Patient is diabetic and last A1c was    Past Medical History:  Diagnosis Date   (HFpEF) heart failure with preserved ejection fraction (HCC)    Ascending aorta dilatation (HCC)    40mm by echo 11/2022   CKD (chronic kidney disease), stage IV (HCC)    Diabetes mellitus without complication (HCC)    Hypertension    Mitral regurgitation    mild by echo 11/2022   Normocytic anemia     Objective:  Physical Exam: Vascular: DP/PT pulses 2/4 bilateral. CFT <3 seconds. Absent hair growth on digits. Edema noted to bilateral lower extremities. Xerosis noted bilaterally.  Skin. No lacerations or abrasions bilateral feet. Nails 1-5 bilateral  are normal in appearance. Ulceration noted to lateral plantar left foot with necrotic fibrinous base. Measures about 2.5 cm x 2 cm x 0.2 cm. No erythema edema or probe to bone. Minimal bleeding noted to wound bed. New blister and underlying ulceration noted to the medial right first metatarsal head. Granular base and superficial. About 2 cm x 1.5 cm x 0.1 cm x. No erythema edema or purulence noted.  Musculoskeletal: MMT 5/5 bilateral lower extremities in DF, PF, Inversion and Eversion. Deceased ROM in DF of ankle joint.  Neurological: Sensation intact to light touch. Protective sensation diminished bilateral.    Assessment:   1. Diabetic ulcer of left midfoot associated with type 2 diabetes mellitus, with fat layer exposed (HCC)        Plan:  Patient was evaluated and treated and all questions answered. Ulcer lateral left foot with fat layer  exposed , ulcer right medial first metatarsal head limited to breakdown of skin.  -Debridement as below. -Dressed with betadine, DSD. -Off-loading with surgical shoe bilateral.  -Has wound care appointment on Tuesday. .  -Discussed glucose control and proper protein-rich diet.  -Discussed if any worsening redness, pain, fever or chills to call or may need to report to the emergency room. Patient expressed understanding.   Procedure: Excisional Debridement of Wound Rationale: Removal of non-viable soft tissue from the wound to promote healing.  Anesthesia: none Pre-Debridement Wound Measurements: Overlying blister  Post-Debridement Wound Measurements: 2.5 cm x 2 cm x 0.2 cm ( left),  Type of Debridement: Sharp Excisional Tissue Removed: Non-viable soft tissue Depth of Debridement: subcutaneous tissue. Technique: Sharp excisional debridement to bleeding, viable wound base.  Dressing: Dry, sterile, compression dressing. Disposition: Patient tolerated procedure well. Patient to return in 3 months for rfc. Will continue with wound care.   No follow-ups on file.    Louann Sjogren, DPM

## 2023-06-07 ENCOUNTER — Other Ambulatory Visit: Payer: Self-pay

## 2023-06-07 ENCOUNTER — Ambulatory Visit (HOSPITAL_COMMUNITY)
Admission: RE | Admit: 2023-06-07 | Discharge: 2023-06-07 | Disposition: A | Discharge status: Home / Self Care | Attending: Vascular Surgery | Admitting: Vascular Surgery

## 2023-06-07 ENCOUNTER — Encounter (HOSPITAL_COMMUNITY)
Admission: RE | Disposition: A | Discharge status: Home / Self Care | Payer: Self-pay | Source: Home / Self Care | Attending: Vascular Surgery

## 2023-06-07 DIAGNOSIS — E11621 Type 2 diabetes mellitus with foot ulcer: Secondary | ICD-10-CM | POA: Diagnosis present

## 2023-06-07 DIAGNOSIS — Z8249 Family history of ischemic heart disease and other diseases of the circulatory system: Secondary | ICD-10-CM | POA: Insufficient documentation

## 2023-06-07 DIAGNOSIS — Z7902 Long term (current) use of antithrombotics/antiplatelets: Secondary | ICD-10-CM | POA: Insufficient documentation

## 2023-06-07 DIAGNOSIS — I5032 Chronic diastolic (congestive) heart failure: Secondary | ICD-10-CM | POA: Insufficient documentation

## 2023-06-07 DIAGNOSIS — Z7982 Long term (current) use of aspirin: Secondary | ICD-10-CM | POA: Insufficient documentation

## 2023-06-07 DIAGNOSIS — N186 End stage renal disease: Secondary | ICD-10-CM | POA: Insufficient documentation

## 2023-06-07 DIAGNOSIS — L97529 Non-pressure chronic ulcer of other part of left foot with unspecified severity: Secondary | ICD-10-CM | POA: Insufficient documentation

## 2023-06-07 DIAGNOSIS — E1122 Type 2 diabetes mellitus with diabetic chronic kidney disease: Secondary | ICD-10-CM | POA: Insufficient documentation

## 2023-06-07 DIAGNOSIS — I132 Hypertensive heart and chronic kidney disease with heart failure and with stage 5 chronic kidney disease, or end stage renal disease: Secondary | ICD-10-CM | POA: Diagnosis not present

## 2023-06-07 DIAGNOSIS — Z79899 Other long term (current) drug therapy: Secondary | ICD-10-CM | POA: Diagnosis not present

## 2023-06-07 DIAGNOSIS — I70234 Atherosclerosis of native arteries of right leg with ulceration of heel and midfoot: Secondary | ICD-10-CM | POA: Diagnosis not present

## 2023-06-07 DIAGNOSIS — L97519 Non-pressure chronic ulcer of other part of right foot with unspecified severity: Secondary | ICD-10-CM | POA: Diagnosis not present

## 2023-06-07 DIAGNOSIS — I70235 Atherosclerosis of native arteries of right leg with ulceration of other part of foot: Secondary | ICD-10-CM

## 2023-06-07 DIAGNOSIS — Z992 Dependence on renal dialysis: Secondary | ICD-10-CM | POA: Insufficient documentation

## 2023-06-07 DIAGNOSIS — Z833 Family history of diabetes mellitus: Secondary | ICD-10-CM | POA: Insufficient documentation

## 2023-06-07 DIAGNOSIS — I7025 Atherosclerosis of native arteries of other extremities with ulceration: Secondary | ICD-10-CM | POA: Insufficient documentation

## 2023-06-07 DIAGNOSIS — Z87891 Personal history of nicotine dependence: Secondary | ICD-10-CM | POA: Insufficient documentation

## 2023-06-07 HISTORY — PX: LOWER EXTREMITY INTERVENTION: CATH118252

## 2023-06-07 HISTORY — PX: ABDOMINAL AORTOGRAM: CATH118222

## 2023-06-07 HISTORY — PX: LOWER EXTREMITY ANGIOGRAPHY: CATH118251

## 2023-06-07 LAB — POCT I-STAT, CHEM 8
BUN: 39 mg/dL — ABNORMAL HIGH (ref 6–20)
Calcium, Ion: 1.12 mmol/L — ABNORMAL LOW (ref 1.15–1.40)
Chloride: 104 mmol/L (ref 98–111)
Creatinine, Ser: 6.6 mg/dL — ABNORMAL HIGH (ref 0.61–1.24)
Glucose, Bld: 116 mg/dL — ABNORMAL HIGH (ref 70–99)
HCT: 30 % — ABNORMAL LOW (ref 39.0–52.0)
Hemoglobin: 10.2 g/dL — ABNORMAL LOW (ref 13.0–17.0)
Potassium: 4.3 mmol/L (ref 3.5–5.1)
Sodium: 139 mmol/L (ref 135–145)
TCO2: 27 mmol/L (ref 22–32)

## 2023-06-07 SURGERY — ABDOMINAL AORTOGRAM
Anesthesia: LOCAL

## 2023-06-07 MED ORDER — SODIUM CHLORIDE 0.9% FLUSH: 3.0000 mL | Freq: Two times a day (BID) | INTRAVENOUS | Status: DC

## 2023-06-07 MED ORDER — SODIUM CHLORIDE 0.9 % IV SOLN: 250.0000 mL | INTRAVENOUS | Status: DC | PRN

## 2023-06-07 MED ORDER — IODIXANOL 320 MG/ML IV SOLN
INTRAVENOUS | Status: DC | PRN
  Administered 2023-06-07: 80 mL

## 2023-06-07 MED ORDER — HEPARIN (PORCINE) IN NACL 1000-0.9 UT/500ML-% IV SOLN
INTRAVENOUS | Status: DC | PRN
Start: 2023-06-07 — End: 2023-06-07
  Administered 2023-06-07 (×2): 500 mL

## 2023-06-07 MED ORDER — MIDAZOLAM HCL 2 MG/2ML IJ SOLN
INTRAMUSCULAR | Status: AC
  Filled 2023-06-07: qty 2

## 2023-06-07 MED ORDER — HYDRALAZINE HCL 20 MG/ML IJ SOLN: 5.0000 mg | INTRAMUSCULAR | Status: DC | PRN

## 2023-06-07 MED ORDER — PROTAMINE SULFATE 10 MG/ML IV SOLN
INTRAVENOUS | Status: DC | PRN
  Administered 2023-06-07: 15 mg via INTRAVENOUS
  Administered 2023-06-07: 5 mg via INTRAVENOUS

## 2023-06-07 MED ORDER — ONDANSETRON HCL 4 MG/2ML IJ SOLN: 4.0000 mg | Freq: Four times a day (QID) | INTRAMUSCULAR | Status: DC | PRN

## 2023-06-07 MED ORDER — SODIUM CHLORIDE 0.9 % IV SOLN: INTRAVENOUS | Status: DC

## 2023-06-07 MED ORDER — LIDOCAINE HCL (PF) 1 % IJ SOLN
INTRAMUSCULAR | Status: AC
  Filled 2023-06-07: qty 30

## 2023-06-07 MED ORDER — SODIUM CHLORIDE 0.9 % WEIGHT BASED INFUSION: 1.0000 mL/kg/h | INTRAVENOUS | Status: DC

## 2023-06-07 MED ORDER — PROTAMINE SULFATE 10 MG/ML IV SOLN
INTRAVENOUS | Status: AC
  Filled 2023-06-07: qty 5

## 2023-06-07 MED ORDER — SODIUM CHLORIDE 0.9% FLUSH: 3.0000 mL | INTRAVENOUS | Status: DC | PRN

## 2023-06-07 MED ORDER — FENTANYL CITRATE (PF) 100 MCG/2ML IJ SOLN
INTRAMUSCULAR | Status: DC | PRN
  Administered 2023-06-07 (×2): 50 ug via INTRAVENOUS

## 2023-06-07 MED ORDER — HEPARIN SODIUM (PORCINE) 1000 UNIT/ML IJ SOLN
INTRAMUSCULAR | Status: AC
  Filled 2023-06-07: qty 10

## 2023-06-07 MED ORDER — HEPARIN SODIUM (PORCINE) 1000 UNIT/ML IJ SOLN
INTRAMUSCULAR | Status: DC | PRN
  Administered 2023-06-07: 7000 [IU] via INTRAVENOUS

## 2023-06-07 MED ORDER — ACETAMINOPHEN 325 MG PO TABS: 650.0000 mg | ORAL_TABLET | ORAL | Status: DC | PRN

## 2023-06-07 MED ORDER — LABETALOL HCL 5 MG/ML IV SOLN: 10.0000 mg | INTRAVENOUS | Status: DC | PRN

## 2023-06-07 MED ORDER — FENTANYL CITRATE (PF) 100 MCG/2ML IJ SOLN
INTRAMUSCULAR | Status: AC
  Filled 2023-06-07: qty 2

## 2023-06-07 MED ORDER — LIDOCAINE HCL (PF) 1 % IJ SOLN
INTRAMUSCULAR | Status: DC | PRN
  Administered 2023-06-07: 15 mL via INTRADERMAL

## 2023-06-07 MED ORDER — MIDAZOLAM HCL 2 MG/2ML IJ SOLN
INTRAMUSCULAR | Status: DC | PRN
  Administered 2023-06-07: 1 mg via INTRAVENOUS

## 2023-06-07 SURGICAL SUPPLY — 18 items
BALLN IN.PACT DCB 5X40 (BALLOONS) ×2 IMPLANT
BALLN MUSTANG 4X40X135 (BALLOONS) ×2 IMPLANT
BALLOON MUSTANG 4X40X135 (BALLOONS) IMPLANT
CATH OMNI FLUSH 5F 65CM (CATHETERS) IMPLANT
COVER DOME SNAP 22 D (MISCELLANEOUS) IMPLANT
DCB IN.PACT 5X40 (BALLOONS) IMPLANT
DEVICE CLOSURE MYNXGRIP 6/7F (Vascular Products) IMPLANT
GLIDEWIRE ADV .035X260CM (WIRE) IMPLANT
KIT ENCORE 26 ADVANTAGE (KITS) IMPLANT
KIT MICROPUNCTURE NIT STIFF (SHEATH) IMPLANT
SET ATX-X65L (MISCELLANEOUS) IMPLANT
SHEATH CATAPULT 6FR 60 (SHEATH) IMPLANT
SHEATH PINNACLE 5F 10CM (SHEATH) IMPLANT
SHEATH PINNACLE 6F 10CM (SHEATH) IMPLANT
SHEATH PROBE COVER 6X72 (BAG) IMPLANT
TRAY PV CATH (CUSTOM PROCEDURE TRAY) ×2 IMPLANT
WIRE BENTSON .035X145CM (WIRE) IMPLANT
WIRE TORQFLEX AUST .018X40CM (WIRE) IMPLANT
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
## 2023-06-07 NOTE — Op Note (Signed)
 Patient name: Samuel Gardner MRN: [id number] DOB: 1970-06-14 Sex: male  06/07/2023 Pre-operative Diagnosis: Bilateral lower extremity critical limb ischemia tissue loss of the left and right feet Post-operative diagnosis:  Same Surgeon:  Victorino Sparrow, MD Procedure Performed: 1.  Ultrasound-guided micropuncture access of the left common femoral artery in retrograde fashion 2.  Aortogram 3.  Second-order cannulation, right lower extremity angiogram 4.  Third order cannulation, right lower extremity angiogram 5.  Balloon angioplasty right superficial femoral artery 4 x 40 6.  Drug-coated balloon angioplasty right superficial femoral artery 5 x 40 mm 7.  Device assisted closure-Mynx 8.  Moderate sedation time 34 minutes, contrast volume 80 mL   Indications: Patient is a 53 year old male with end-stage renal disease currently on dialysis Monday Wednesday Friday.  He is well-known to my service, previously having undergone left-sided endovascular intervention with balloon lithotripsy of the tibioperoneal trunk, peroneal artery, posterior tibial artery on 04/19/2023.  He was seen in follow-up, noted to have a new right lower extremity heel wound.  Prior imaging demonstrated right sided single-vessel peroneal runoff with disease in the superficial femoral artery.  Imaging was nondiagnostic distally due to a tibial rod.  After discussing risk and benefits of right lower extremity angiography in an effort to define and improve distal perfusion for wound healing, Jakiah elected to proceed.  Findings:   Patent celiac artery, patent superior mesenteric artery, bilateral renal arteries patent No flow-limiting stenosis appreciated in the aortoiliac segments bilaterally.  On the right: Widely patent common femoral artery, profunda.  The superficial femoral artery had calcific disease, but no flow-limiting stenosis except for a focal, greater than 70% stenotic lesion at the distal superficial femoral  artery.  The popliteal artery was widely patent.  Distally, there was single-vessel peroneal runoff which filled the foot via medial and lateral perforator vessels.  Multiple collaterals in the feet.  The dorsalis pedis reconstitutes through collaterals.  The lateral plantar artery reconstitutes through collaterals, but is underfilled and atretic.   Procedure:  The patient was identified in the holding area and taken to room 8.  The patient was then placed supine on the table and prepped and draped in the usual sterile fashion.  A time out was called.  Moderate anesthesia was induced using fentanyl and Versed.  Ultrasound was used to evaluate the left common femoral artery.  It was patent .  A digital ultrasound image was acquired.  A micropuncture needle was used to access the left common femoral artery under ultrasound guidance.  An 018 wire was advanced without resistance and a micropuncture sheath was placed.  The 018 wire was removed and a benson wire was placed.  The micropuncture sheath was exchanged for a 5 french sheath.  An omniflush catheter was advanced over the wire to the level of L-1.  An abdominal angiogram was obtained.  Next, using the omniflush catheter and a benson wire, the aortic bifurcation was crossed and the catheter was placed into theright external iliac artery and right runoff was obtained.  right runoff was performed via retrograde sheath injections.  I elected to attempt intervention on the right lower extremity.  The patient was heparinized and a 6 x 60 cm sheath was brought onto the field and parked in the right superficial femoral artery.  Angiography followed from the superficial femoral artery to define the lesion.  Next, a series of wires and catheters were used to cross the superficial femoral artery lesion.  A 4 x 40 mm  balloon was brought onto the field and inflated for 3 minutes.  Follow-up angiography demonstrated improvement, however there was a small amount of residual  stenosis, therefore I elected to increase the size, and moved to a drug-coated balloon.  A 5 x 40 mm drug-coated balloon was brought to the field and inflated for 3 minutes.  Follow-up angiography demonstrated excellent result with resolution of flow-limiting stenosis.  Completion angiography demonstrated single-vessel peroneal runoff to the foot with no changes in collateralization of the foot. I was very happy with this result. The arteriotomy was managed with a minx device without issue.  Impression: Successful drug-coated balloon angioplasty of the right distal superficial femoral artery 5 x 40 mm Patient has been maximally revascularized bilaterally.   Victorino Sparrow MD [facility] Office: [phone]

## 2023-06-08 ENCOUNTER — Encounter (HOSPITAL_COMMUNITY): Payer: Self-pay | Admitting: Vascular Surgery

## 2023-06-13 ENCOUNTER — Encounter (HOSPITAL_BASED_OUTPATIENT_CLINIC_OR_DEPARTMENT_OTHER): Attending: Internal Medicine | Admitting: Internal Medicine

## 2023-06-13 DIAGNOSIS — E11621 Type 2 diabetes mellitus with foot ulcer: Secondary | ICD-10-CM | POA: Insufficient documentation

## 2023-06-13 DIAGNOSIS — I70245 Atherosclerosis of native arteries of left leg with ulceration of other part of foot: Secondary | ICD-10-CM | POA: Insufficient documentation

## 2023-06-13 DIAGNOSIS — L97518 Non-pressure chronic ulcer of other part of right foot with other specified severity: Secondary | ICD-10-CM | POA: Insufficient documentation

## 2023-06-13 DIAGNOSIS — N186 End stage renal disease: Secondary | ICD-10-CM | POA: Insufficient documentation

## 2023-06-13 DIAGNOSIS — I70235 Atherosclerosis of native arteries of right leg with ulceration of other part of foot: Secondary | ICD-10-CM | POA: Insufficient documentation

## 2023-06-13 DIAGNOSIS — L97528 Non-pressure chronic ulcer of other part of left foot with other specified severity: Secondary | ICD-10-CM | POA: Insufficient documentation

## 2023-06-16 ENCOUNTER — Encounter (HOSPITAL_COMMUNITY): Payer: Self-pay

## 2023-06-16 ENCOUNTER — Other Ambulatory Visit: Payer: Self-pay | Admitting: Internal Medicine

## 2023-06-16 DIAGNOSIS — I2481 Acute coronary microvascular dysfunction: Secondary | ICD-10-CM

## 2023-06-19 ENCOUNTER — Telehealth: Payer: Self-pay

## 2023-06-19 MED ORDER — FUROSEMIDE 80 MG PO TABS: 80.0000 mg | ORAL_TABLET | Freq: Two times a day (BID) | ORAL | 0 refills | Status: DC

## 2023-06-19 NOTE — Telephone Encounter (Signed)
 Spoke with pt regarding his medications. Pt stated he is not taking Lipitor and is taking Crestor. Lipitor gave him leg cramps so he was switched to Crestor. Pt was told this information would be forwarded to Dr. Mayford Knife. Pt verbalized understanding, all questions, if any, were answered.

## 2023-06-22 ENCOUNTER — Other Ambulatory Visit (HOSPITAL_COMMUNITY): Payer: Self-pay | Admitting: Internal Medicine

## 2023-06-22 ENCOUNTER — Encounter (HOSPITAL_BASED_OUTPATIENT_CLINIC_OR_DEPARTMENT_OTHER): Admitting: Internal Medicine

## 2023-06-22 DIAGNOSIS — E11621 Type 2 diabetes mellitus with foot ulcer: Secondary | ICD-10-CM | POA: Diagnosis not present

## 2023-06-22 DIAGNOSIS — L97528 Non-pressure chronic ulcer of other part of left foot with other specified severity: Secondary | ICD-10-CM

## 2023-06-22 DIAGNOSIS — L97518 Non-pressure chronic ulcer of other part of right foot with other specified severity: Secondary | ICD-10-CM

## 2023-06-27 ENCOUNTER — Encounter (HOSPITAL_COMMUNITY): Payer: Self-pay

## 2023-06-29 ENCOUNTER — Ambulatory Visit (HOSPITAL_COMMUNITY)
Admission: RE | Admit: 2023-06-29 | Discharge: 2023-06-29 | Disposition: A | Discharge status: Home / Self Care | Payer: Self-pay | Source: Ambulatory Visit | Attending: Internal Medicine | Admitting: Internal Medicine

## 2023-06-29 ENCOUNTER — Encounter (HOSPITAL_BASED_OUTPATIENT_CLINIC_OR_DEPARTMENT_OTHER): Admitting: Internal Medicine

## 2023-06-29 DIAGNOSIS — L97528 Non-pressure chronic ulcer of other part of left foot with other specified severity: Secondary | ICD-10-CM

## 2023-07-04 ENCOUNTER — Encounter (HOSPITAL_BASED_OUTPATIENT_CLINIC_OR_DEPARTMENT_OTHER): Admitting: Internal Medicine

## 2023-07-04 DIAGNOSIS — N186 End stage renal disease: Secondary | ICD-10-CM | POA: Diagnosis not present

## 2023-07-04 DIAGNOSIS — L97528 Non-pressure chronic ulcer of other part of left foot with other specified severity: Secondary | ICD-10-CM | POA: Diagnosis not present

## 2023-07-04 DIAGNOSIS — E11621 Type 2 diabetes mellitus with foot ulcer: Secondary | ICD-10-CM

## 2023-07-04 DIAGNOSIS — I70235 Atherosclerosis of native arteries of right leg with ulceration of other part of foot: Secondary | ICD-10-CM

## 2023-07-04 DIAGNOSIS — L97518 Non-pressure chronic ulcer of other part of right foot with other specified severity: Secondary | ICD-10-CM

## 2023-07-10 ENCOUNTER — Other Ambulatory Visit: Payer: Self-pay | Admitting: Student

## 2023-07-11 ENCOUNTER — Encounter (HOSPITAL_BASED_OUTPATIENT_CLINIC_OR_DEPARTMENT_OTHER): Admitting: Internal Medicine

## 2023-07-11 DIAGNOSIS — L97528 Non-pressure chronic ulcer of other part of left foot with other specified severity: Secondary | ICD-10-CM

## 2023-07-11 DIAGNOSIS — N186 End stage renal disease: Secondary | ICD-10-CM | POA: Diagnosis not present

## 2023-07-11 DIAGNOSIS — L97518 Non-pressure chronic ulcer of other part of right foot with other specified severity: Secondary | ICD-10-CM

## 2023-07-11 DIAGNOSIS — E11621 Type 2 diabetes mellitus with foot ulcer: Secondary | ICD-10-CM

## 2023-07-12 ENCOUNTER — Other Ambulatory Visit: Payer: Self-pay

## 2023-07-12 DIAGNOSIS — I70235 Atherosclerosis of native arteries of right leg with ulceration of other part of foot: Secondary | ICD-10-CM

## 2023-07-13 ENCOUNTER — Ambulatory Visit (HOSPITAL_COMMUNITY)
Admission: RE | Admit: 2023-07-13 | Discharge: 2023-07-13 | Disposition: A | Discharge status: Home / Self Care | Source: Ambulatory Visit | Attending: Vascular Surgery | Admitting: Vascular Surgery

## 2023-07-13 DIAGNOSIS — I70235 Atherosclerosis of native arteries of right leg with ulceration of other part of foot: Secondary | ICD-10-CM | POA: Diagnosis present

## 2023-07-13 LAB — VAS US ABI WITH/WO TBI

## 2023-07-18 ENCOUNTER — Encounter (HOSPITAL_BASED_OUTPATIENT_CLINIC_OR_DEPARTMENT_OTHER): Attending: Internal Medicine | Admitting: Internal Medicine

## 2023-07-18 DIAGNOSIS — I70235 Atherosclerosis of native arteries of right leg with ulceration of other part of foot: Secondary | ICD-10-CM | POA: Insufficient documentation

## 2023-07-18 DIAGNOSIS — I70245 Atherosclerosis of native arteries of left leg with ulceration of other part of foot: Secondary | ICD-10-CM | POA: Diagnosis not present

## 2023-07-18 DIAGNOSIS — N186 End stage renal disease: Secondary | ICD-10-CM | POA: Diagnosis not present

## 2023-07-18 DIAGNOSIS — L97528 Non-pressure chronic ulcer of other part of left foot with other specified severity: Secondary | ICD-10-CM | POA: Diagnosis not present

## 2023-07-18 DIAGNOSIS — E11621 Type 2 diabetes mellitus with foot ulcer: Secondary | ICD-10-CM | POA: Insufficient documentation

## 2023-07-18 DIAGNOSIS — L97518 Non-pressure chronic ulcer of other part of right foot with other specified severity: Secondary | ICD-10-CM | POA: Diagnosis not present

## 2023-07-19 NOTE — Progress Notes (Unsigned)
 HISTORY AND PHYSICAL     CC:  follow up. Requesting Provider:  Cleven [place], DO  HPI: This is a 53 y.o. male who is here today for follow up for PAD.  Pt has hx of aortogram LLE via right CFA with balloon lithotripsy left TPT, peroneal artery and PTA 04/19/2023 by Dr. Rosalva Comber.  He subsequently underwent aortogram RLE with balloon angioplasty right SFA, drug coated balloon angioplasty right SFA via left CFA 06/07/2023.  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  Pt seen in the wound center on 5/6 and wounds were improving.  Pt has hx of ESRD with creation of left BC AVF on 12/13/2022 by Dr. Rosalva Comber.   The pt returns today for follow up.  ***  The pt is on a statin for cholesterol management.    The pt is on an aspirin .    Other AC:  Plavix  The pt is on BB, CCB, hydralazine  for hypertension.  The pt is  on medication for diabetes. Tobacco hx:  former  Pt does *** have family hx of AAA.  Past Medical History:  Diagnosis Date   (HFpEF) heart failure with preserved ejection fraction (HCC)    Ascending aorta dilatation (HCC)    40mm by echo 11/2022   CKD (chronic kidney disease), stage IV (HCC)    Diabetes mellitus without complication (HCC)    Hypertension    Mitral regurgitation    mild by echo 11/2022   Normocytic anemia     Past Surgical History:  Procedure Laterality Date   ABDOMINAL AORTOGRAM N/A 06/07/2023   Procedure: ABDOMINAL AORTOGRAM;  Surgeon: Kayla Part, MD;  Location: [facility] INVASIVE CV LAB;  Service: Cardiovascular;  Laterality: N/A;   ABDOMINAL AORTOGRAM W/LOWER EXTREMITY Bilateral 04/19/2023   Procedure: ABDOMINAL AORTOGRAM W/LOWER EXTREMITY;  Surgeon: Kayla Part, MD;  Location: [facility] INVASIVE CV LAB;  Service: Cardiovascular;  Laterality: Bilateral;   AV FISTULA PLACEMENT Left 12/13/2022   Procedure: LEFT ARM BRACHIOCEPHALIC ARTERIOVENOUS (AV) FISTULA CREATION;  Surgeon: Kayla Part, MD;  Location: [facility] OR;  Service: Vascular;  Laterality: Left;    COLONOSCOPY WITH PROPOFOL  N/A 04/11/2023   Procedure: COLONOSCOPY WITH PROPOFOL ;  Surgeon: Elois Hair, MD;  Location: [facility] ENDOSCOPY;  Service: Gastroenterology;  Laterality: N/A;   LEG SURGERY Right    teenager   LOWER EXTREMITY ANGIOGRAPHY N/A 06/07/2023   Procedure: Lower Extremity Angiography;  Surgeon: Kayla Part, MD;  Location: [facility] INVASIVE CV LAB;  Service: Cardiovascular;  Laterality: N/A;   LOWER EXTREMITY INTERVENTION  06/07/2023   Procedure: LOWER EXTREMITY INTERVENTION;  Surgeon: Kayla Part, MD;  Location: [facility] INVASIVE CV LAB;  Service: Cardiovascular;;   PERIPHERAL INTRAVASCULAR LITHOTRIPSY Left 04/19/2023   Procedure: PERIPHERAL INTRAVASCULAR LITHOTRIPSY;  Surgeon: Kayla Part, MD;  Location: [facility] INVASIVE CV LAB;  Service: Cardiovascular;  Laterality: Left;   PERIPHERAL VASCULAR BALLOON ANGIOPLASTY Left 04/19/2023   Procedure: PERIPHERAL VASCULAR BALLOON ANGIOPLASTY;  Surgeon: Kayla Part, MD;  Location: [facility] INVASIVE CV LAB;  Service: Cardiovascular;  Laterality: Left;   POLYPECTOMY  04/11/2023   Procedure: POLYPECTOMY;  Surgeon: Elois Hair, MD;  Location: [facility] ENDOSCOPY;  Service: Gastroenterology;;    Allergies  Allergen Reactions   Atorvastatin      Muscle cramping    Current Outpatient Medications  Medication Sig Dispense Refill   acetaminophen  (TYLENOL ) 500 MG tablet Take 1,000 mg by mouth every 6 (six) hours as needed for mild pain (pain score 1-3) or headache.  albuterol  (VENTOLIN  HFA) 108 (90 Base) MCG/ACT inhaler Inhale 2 puffs into the lungs every 6 (six) hours as needed for wheezing or shortness of breath. 6.7 g 2   amLODipine  (NORVASC ) 5 MG tablet Take 1 tablet (5 mg total) by mouth daily. 90 tablet 3   aspirin  EC 81 MG tablet Take 1 tablet (81 mg total) by mouth daily. Swallow whole. 90 tablet 0   carvedilol  (COREG ) 25 MG tablet TAKE 1 TABLET 2 TIMES DAILY 60 tablet 9   clopidogrel  (PLAVIX ) 75 MG tablet Take 1 tablet (75 mg total) by  mouth daily with breakfast. 90 tablet 0   furosemide  (LASIX ) 80 MG tablet TAKE 1 TABLET BY MOUTH TWICE DAILY 218 tablet 1   hydrALAZINE  (APRESOLINE ) 100 MG tablet Take 1 tablet (100 mg total) by mouth 3 (three) times daily. 270 tablet 3   isosorbide  mononitrate (IMDUR ) 30 MG 24 hr tablet TAKE 3 TABLETS(90 MG) BY MOUTH DAILY 270 tablet 3   nitroGLYCERIN  (NITROSTAT ) 0.4 MG SL tablet Place 1 tablet (0.4 mg total) under the tongue every 5 (five) minutes as needed for chest pain. 25 tablet 3   polyethylene glycol powder (GAVILAX) 17 GM/SCOOP powder Take 17 g by mouth daily as needed for mild constipation. 238 g 0   rosuvastatin  (CRESTOR ) 20 MG tablet Take 1 tablet (20 mg total) by mouth daily. 90 tablet 3   Spacer/Aero-Holding Chambers (PRO COMFORT SPACER ADULT) MISC Use spacer as needed with inhalers. 1 each 0   No current facility-administered medications for this visit.    Family History  Adopted: Yes  Problem Relation Age of Onset   Diabetes Mother    Hypertension Mother    Kidney disease Mother    Cancer Father    Diabetes Sister    Hypertension Sister     Social History   Socioeconomic History   Marital status: Married    Spouse name: Not on file   Number of children: Not on file   Years of education: Not on file   Highest education level: Not on file  Occupational History   Not on file  Tobacco Use   Smoking status: Former    Current packs/day: 0.00    Types: Cigarettes    Quit date: 01/30/2022    Years since quitting: 1.4   Smokeless tobacco: Never  Vaping Use   Vaping status: Never Used  Substance and Sexual Activity   Alcohol use: Not Currently   Drug use: No   Sexual activity: Not on file  Other Topics Concern   Not on file  Social History Narrative   Not on file   Social Drivers of Health   Financial Resource Strain: Not on file  Food Insecurity: No Food Insecurity (04/18/2023)   Hunger Vital Sign    Worried About Running Out of Food in the Last Year:  Never true    Ran Out of Food in the Last Year: Never true  Transportation Needs: No Transportation Needs (04/18/2023)   PRAPARE - Administrator, Civil Service (Medical): No    Lack of Transportation (Non-Medical): No  Physical Activity: Not on file  Stress: Not on file  Social Connections: Moderately Isolated (02/23/2022)   Social Connection and Isolation Panel [NHANES]    Frequency of Communication with Friends and Family: More than three times a week    Frequency of Social Gatherings with Friends and Family: More than three times a week    Attends Religious Services: Never  Active Member of Clubs or Organizations: No    Attends Banker Meetings: Never    Marital Status: Married  Catering manager Violence: Not At Risk (04/18/2023)   Humiliation, Afraid, Rape, and Kick questionnaire    Fear of Current or Ex-Partner: No    Emotionally Abused: No    Physically Abused: No    Sexually Abused: No     REVIEW OF SYSTEMS:  *** [X]  denotes positive finding, [ ]  denotes negative finding Cardiac  Comments:  Chest pain or chest pressure:    Shortness of breath upon exertion:    Short of breath when lying flat:    Irregular heart rhythm:        Vascular    Pain in calf, thigh, or hip brought on by ambulation:    Pain in feet at night that wakes you up from your sleep:     Blood clot in your veins:    Leg swelling:         Pulmonary    Oxygen at home:    Productive cough:     Wheezing:         Neurologic    Sudden weakness in arms or legs:     Sudden numbness in arms or legs:     Sudden onset of difficulty speaking or slurred speech:    Temporary loss of vision in one eye:     Problems with dizziness:         Gastrointestinal    Blood in stool:     Vomited blood:         Genitourinary    Burning when urinating:     Blood in urine:        Psychiatric    Major depression:         Hematologic    Bleeding problems:    Problems with blood clotting  too easily:        Skin    Rashes or ulcers:        Constitutional    Fever or chills:      PHYSICAL EXAMINATION:  ***  General:  WDWN in NAD; vital signs documented above Gait: Not observed HENT: WNL, normocephalic Pulmonary: normal non-labored breathing , without wheezing Cardiac: {Desc; regular/irreg:14544} HR, {With/Without:20273} carotid bruit*** Abdomen: soft, NT; aortic pulse is *** palpable Skin: {With/Without:20273} rashes Vascular Exam/Pulses:  Right Left  Radial {Exam; arterial pulse strength 0-4:30167} {Exam; arterial pulse strength 0-4:30167}  Femoral {Exam; arterial pulse strength 0-4:30167} {Exam; arterial pulse strength 0-4:30167}  Popliteal {Exam; arterial pulse strength 0-4:30167} {Exam; arterial pulse strength 0-4:30167}  DP {Exam; arterial pulse strength 0-4:30167} {Exam; arterial pulse strength 0-4:30167}  PT {Exam; arterial pulse strength 0-4:30167} {Exam; arterial pulse strength 0-4:30167}  Peroneal *** ***   Extremities: {With/Without:20273} ischemic changes, {With/Without:20273} Gangrene , {With/Without:20273} cellulitis; {With/Without:20273} open wounds Musculoskeletal: no muscle wasting or atrophy  Neurologic: A&O X 3 Psychiatric:  The pt has {Desc; normal/abnormal:11317::"Normal"} affect.   Non-Invasive Vascular Imaging:   ABI's/TBI's on 07/13/2023: Right:  [place]/0.41 - Great toe pressure: 75 Left:  [place]/0.23 - Great toe pressure: 42  Previous ABI's/TBI's on 06/01/2023: Right:  1.24/0.21 - Great toe pressure: 39 Left:  0.71/0.15 - Great toe pressure:  27    ASSESSMENT/PLAN:: 53 y.o. male here for follow up for PAD with hx of aortogram LLE via right CFA with balloon lithotripsy left TPT, peroneal artery and PTA 04/19/2023 by Dr. Rosalva Comber.  He subsequently underwent aortogram RLE with balloon angioplasty right SFA,  drug coated balloon angioplasty right SFA via left CFA 06/07/2023.  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   -*** -continue  asa/statin/plavix  -pt will f/u in *** with ***.   Maryanna Smart, [facility] [phone]  Clinic MD:   Rosalva Comber

## 2023-07-20 ENCOUNTER — Encounter (HOSPITAL_COMMUNITY)

## 2023-07-20 ENCOUNTER — Ambulatory Visit: Attending: Vascular Surgery | Admitting: Physician Assistant

## 2023-07-20 ENCOUNTER — Encounter: Payer: Self-pay | Admitting: Physician Assistant

## 2023-07-20 VITALS — BP 174/69 | HR 62 | Temp 97.9°F | Ht 67.0 in | Wt 182.2 lb

## 2023-07-20 DIAGNOSIS — N186 End stage renal disease: Secondary | ICD-10-CM | POA: Diagnosis not present

## 2023-07-20 DIAGNOSIS — I70223 Atherosclerosis of native arteries of extremities with rest pain, bilateral legs: Secondary | ICD-10-CM

## 2023-07-20 DIAGNOSIS — Z992 Dependence on renal dialysis: Secondary | ICD-10-CM

## 2023-07-24 ENCOUNTER — Other Ambulatory Visit: Payer: Self-pay

## 2023-07-24 DIAGNOSIS — I70235 Atherosclerosis of native arteries of right leg with ulceration of other part of foot: Secondary | ICD-10-CM

## 2023-07-25 ENCOUNTER — Encounter (HOSPITAL_BASED_OUTPATIENT_CLINIC_OR_DEPARTMENT_OTHER): Admitting: Internal Medicine

## 2023-07-25 DIAGNOSIS — E11621 Type 2 diabetes mellitus with foot ulcer: Secondary | ICD-10-CM | POA: Diagnosis not present

## 2023-07-25 DIAGNOSIS — L97528 Non-pressure chronic ulcer of other part of left foot with other specified severity: Secondary | ICD-10-CM | POA: Diagnosis not present

## 2023-07-25 DIAGNOSIS — L97518 Non-pressure chronic ulcer of other part of right foot with other specified severity: Secondary | ICD-10-CM

## 2023-07-29 LAB — LIPID PANEL
Chol/HDL Ratio: 2.9 ratio (ref 0.0–5.0)
Cholesterol, Total: 116 mg/dL (ref 100–199)
HDL: 40 mg/dL (ref >39)
LDL Chol Calc (NIH): 58 mg/dL (ref 0–99)
Triglycerides: 97 mg/dL (ref 0–149)
VLDL Cholesterol Cal: 18 mg/dL (ref 5–40)

## 2023-07-31 ENCOUNTER — Ambulatory Visit: Payer: Self-pay | Admitting: Physician Assistant

## 2023-08-03 ENCOUNTER — Encounter (HOSPITAL_BASED_OUTPATIENT_CLINIC_OR_DEPARTMENT_OTHER): Admitting: Internal Medicine

## 2023-08-03 DIAGNOSIS — L97518 Non-pressure chronic ulcer of other part of right foot with other specified severity: Secondary | ICD-10-CM

## 2023-08-03 DIAGNOSIS — I70235 Atherosclerosis of native arteries of right leg with ulceration of other part of foot: Secondary | ICD-10-CM

## 2023-08-03 DIAGNOSIS — L97528 Non-pressure chronic ulcer of other part of left foot with other specified severity: Secondary | ICD-10-CM

## 2023-08-03 DIAGNOSIS — E11621 Type 2 diabetes mellitus with foot ulcer: Secondary | ICD-10-CM

## 2023-08-03 DIAGNOSIS — D509 Iron deficiency anemia, unspecified: Secondary | ICD-10-CM | POA: Insufficient documentation

## 2023-08-04 ENCOUNTER — Ambulatory Visit: Payer: Self-pay

## 2023-08-04 ENCOUNTER — Telehealth: Payer: Self-pay | Admitting: Podiatry

## 2023-08-04 NOTE — Telephone Encounter (Signed)
 Copied from CRM [id number]. Topic: Clinical - Medical Advice >> Aug 04, 2023  9:33 AM Shelby Dessert H wrote: Reason for CRM: Patients wife called and stated that the patient nerves in his legs are bothering him to the point he is becoming immobile, not wanting to eat, barely can get up to use the restroom on his own, and she is needed some guidance on what he needs to do, a nephro provider suggested gabapentin, patients wife callback number is [phone]  Chief Complaint: leg pain; states nerve pain Symptoms: pain Frequency: constant Pertinent Negatives: Patient denies fever, numbness, cp, sob Disposition: [] ED /[x] Urgent Care (no appt availability in office) / [] Appointment(In office/virtual)/ []  [place] [facility]/ [] Home Care/ [] Refused Recommended Disposition /[] [place] [facility]/ []  Follow-up with PCP Additional Notes: instructed to go to UC; no apt available for today; care advice given, denies questions; instructed to go to ER if becomes worse.   Reason for Disposition  [1] Tingling (e.g., pins and needles) of the face, arm / hand, or leg / foot on one side of the body AND [2] present now (Exceptions: Chronic or recurrent symptom lasting > 4 weeks; or tingling from known cause, such as: bumped elbow, carpal tunnel syndrome, pinched nerve, frostbite.)  Answer Assessment - Initial Assessment Questions 1. SYMPTOM: "What is the main symptom you are concerned about?" (e.g., weakness, numbness)     Extreme pain 2. ONSET: "When did this start?" (minutes, hours, days; while sleeping)     Sunday  3. LAST NORMAL: "When was the last time you (the patient) were normal (no symptoms)?"     Saturday 4. PATTERN "Does this come and go, or has it been constant since it started?"  "Is it present now?"     constant 5. CARDIAC SYMPTOMS: "Have you had any of the following symptoms: chest pain, difficulty breathing, palpitations?"     no 6. NEUROLOGIC SYMPTOMS: "Have you had any of the following  symptoms: headache, dizziness, vision loss, double vision, changes in speech, unsteady on your feet?"     Unsteady on feet due to pain 7. OTHER SYMPTOMS: "Do you have any other symptoms?"     Just extreme pain, can't sleep, eat 8. PREGNANCY: "Is there any chance you are pregnant?" "When was your last menstrual period?"     na  Protocols used: Neurologic Deficit-A-AH

## 2023-08-04 NOTE — Telephone Encounter (Signed)
 Received referral for evaluation for surgical debridement of right medial foot wound  from [place] [facility] at wound care. Patient is only available Tuesdays and Thursdays due to dialysis- is willing to travel to [place] location. Patient was wondering if there was anyway they could be worked into your schedule week of 5/26 or the following week. Patient does have an appointment 5/29 with wound care in the case we are unable to get them in. I have alos added patient to waitlist as high priority in the meantime in case of cancellation. Please advise, Thanks

## 2023-08-08 ENCOUNTER — Other Ambulatory Visit: Payer: Self-pay | Admitting: Student

## 2023-08-08 ENCOUNTER — Ambulatory Visit (HOSPITAL_COMMUNITY)
Admission: RE | Admit: 2023-08-08 | Discharge: 2023-08-08 | Disposition: A | Discharge status: Home / Self Care | Source: Ambulatory Visit | Attending: Family Medicine | Admitting: Family Medicine

## 2023-08-08 ENCOUNTER — Ambulatory Visit (INDEPENDENT_AMBULATORY_CARE_PROVIDER_SITE_OTHER): Payer: Self-pay | Admitting: Student

## 2023-08-08 VITALS — BP 114/59 | HR 65 | Temp 98.2°F | Ht 67.0 in | Wt 177.4 lb

## 2023-08-08 DIAGNOSIS — M79604 Pain in right leg: Secondary | ICD-10-CM | POA: Insufficient documentation

## 2023-08-08 DIAGNOSIS — I10 Essential (primary) hypertension: Secondary | ICD-10-CM | POA: Diagnosis not present

## 2023-08-08 MED ORDER — GABAPENTIN 300 MG PO CAPS
300.0000 mg | ORAL_CAPSULE | Freq: Every day | ORAL | 0 refills | Status: DC
Start: 2023-08-08 — End: 2023-09-05

## 2023-08-08 NOTE — Patient Instructions (Addendum)
 Go to [facility] for your leg x-ray.  I'll call you about the results.  Start gabapentin nightly in the meantime. Don't exceed 1 capsule as the risk of side effects from this medicine are high because of your kidney disease.  Remember to bring all of the medications that you take (including over the counter medications and supplements) with you to every clinic visit.  This after visit summary is an important review of tests, referrals, and medication changes that were discussed during your visit. If you have questions or concerns, call [phone]. Outside of clinic business hours, call the main hospital at [phone] and ask the operator for the on-call internal medicine resident.   Adria Hopkins MD 08/08/2023, 12:06 PM

## 2023-08-08 NOTE — Assessment & Plan Note (Addendum)
 Blood pressure good on current regimen.  Continue amlodipine  5 mg, carvedilol  25 mg twice daily, hydralazine  100 mg 3 times daily, Imdur  90 mg daily.

## 2023-08-08 NOTE — Progress Notes (Signed)
 Patient name: Samuel Gardner Date of birth: 07/16/1970 Date of visit: 08/08/23  Subjective  Chief Complaint  Patient presents with   Leg Pain    Requesting pain meds    Leg Pain    Samuel Gardner is here for right leg pain.  Samuel Gardner had critical limb ischemia with tissue loss in the left and right feet in March.  He underwent an aortogram with angioplasty of right superficial femoral artery.  He was doing well.  He had a follow-up with his vascular specialist on May 8.  Shortly after that appointment he started to have right leg pain.  The pain has been ongoing for about 2 to 3 weeks.  It is very severe, 7-8 out of 10.  It seems to radiate from the foot up the leg.  It is a sharp burning pain.  His leg is very tender to the touch.  Is difficult for him to bear weight and he is limping.  He is taking Tylenol  at home without improvement in the pain.  Review of Systems  Constitutional:  Negative for fever.  Cardiovascular:  Positive for leg swelling (Mild, somewhat worse on the right than the left).  Musculoskeletal:  Negative for falls and joint pain.  Skin:  Negative for rash.  Neurological:  Negative for weakness.    Patient Active Problem List   Diagnosis Date Noted   Right leg pain 08/08/2023   Nonhealing ulcer of left lower extremity (HCC) 04/26/2023   PAD (peripheral artery disease) (HCC) 04/19/2023   Ulcer of foot (HCC) 04/18/2023   Screening for colon cancer 04/11/2023   Benign neoplasm of ascending colon 04/11/2023   Chronic kidney disease (CKD), stage IV (severe) (HCC) 04/11/2023   Ascending aorta dilatation (HCC)    Mitral regurgitation    Dyspnea 11/11/2022   Colon cancer screening declined 07/06/2022   Tobacco use 05/23/2022   Normocytic anemia 02/23/2022   ESRD (end stage renal disease) (HCC) 02/10/2022   (HFpEF) heart failure with preserved ejection fraction (HCC) 02/10/2022   Essential hypertension 02/10/2022   Hyperkalemia 02/09/2022   Past  Medical History:  Diagnosis Date   (HFpEF) heart failure with preserved ejection fraction (HCC)    Ascending aorta dilatation (HCC)    40mm by echo 11/2022   CKD (chronic kidney disease), stage IV (HCC)    Diabetes mellitus without complication (HCC)    Hypertension    Mitral regurgitation    mild by echo 11/2022   Normocytic anemia    Outpatient Encounter Medications as of 08/08/2023  Medication Sig Note   gabapentin (NEURONTIN) 300 MG capsule Take 1 capsule (300 mg total) by mouth at bedtime.    acetaminophen  (TYLENOL ) 500 MG tablet Take 1,000 mg by mouth every 6 (six) hours as needed for mild pain (pain score 1-3) or headache.    albuterol  (VENTOLIN  HFA) 108 (90 Base) MCG/ACT inhaler Inhale 2 puffs into the lungs every 6 (six) hours as needed for wheezing or shortness of breath.    amLODipine  (NORVASC ) 5 MG tablet Take 1 tablet (5 mg total) by mouth daily.    aspirin  EC 81 MG tablet Take 1 tablet (81 mg total) by mouth daily. Swallow whole.    carvedilol  (COREG ) 25 MG tablet TAKE 1 TABLET 2 TIMES DAILY    clopidogrel  (PLAVIX ) 75 MG tablet Take 1 tablet (75 mg total) by mouth daily with breakfast.    furosemide  (LASIX ) 80 MG tablet TAKE 1 TABLET BY MOUTH TWICE DAILY  hydrALAZINE  (APRESOLINE ) 100 MG tablet Take 1 tablet (100 mg total) by mouth 3 (three) times daily.    isosorbide  mononitrate (IMDUR ) 30 MG 24 hr tablet TAKE 3 TABLETS(90 MG) BY MOUTH DAILY    nitroGLYCERIN  (NITROSTAT ) 0.4 MG SL tablet Place 1 tablet (0.4 mg total) under the tongue every 5 (five) minutes as needed for chest pain. 04/18/2023: Never had to take   polyethylene glycol powder (GAVILAX) 17 GM/SCOOP powder Take 17 g by mouth daily as needed for mild constipation.    rosuvastatin  (CRESTOR ) 20 MG tablet Take 1 tablet (20 mg total) by mouth daily.    Spacer/Aero-Holding Chambers (PRO COMFORT SPACER ADULT) MISC Use spacer as needed with inhalers.    No facility-administered encounter medications on file as of 08/08/2023.    Past Surgical History:  Procedure Laterality Date   ABDOMINAL AORTOGRAM N/A 06/07/2023   Procedure: ABDOMINAL AORTOGRAM;  Surgeon: Kayla Part, MD;  Location: [facility] INVASIVE CV LAB;  Service: Cardiovascular;  Laterality: N/A;   ABDOMINAL AORTOGRAM W/LOWER EXTREMITY Bilateral 04/19/2023   Procedure: ABDOMINAL AORTOGRAM W/LOWER EXTREMITY;  Surgeon: Kayla Part, MD;  Location: [facility] INVASIVE CV LAB;  Service: Cardiovascular;  Laterality: Bilateral;   AV FISTULA PLACEMENT Left 12/13/2022   Procedure: LEFT ARM BRACHIOCEPHALIC ARTERIOVENOUS (AV) FISTULA CREATION;  Surgeon: Kayla Part, MD;  Location: [facility] OR;  Service: Vascular;  Laterality: Left;   COLONOSCOPY WITH PROPOFOL  N/A 04/11/2023   Procedure: COLONOSCOPY WITH PROPOFOL ;  Surgeon: Elois Hair, MD;  Location: [facility] ENDOSCOPY;  Service: Gastroenterology;  Laterality: N/A;   LEG SURGERY Right    teenager   LOWER EXTREMITY ANGIOGRAPHY N/A 06/07/2023   Procedure: Lower Extremity Angiography;  Surgeon: Kayla Part, MD;  Location: [facility] INVASIVE CV LAB;  Service: Cardiovascular;  Laterality: N/A;   LOWER EXTREMITY INTERVENTION  06/07/2023   Procedure: LOWER EXTREMITY INTERVENTION;  Surgeon: Kayla Part, MD;  Location: [facility] INVASIVE CV LAB;  Service: Cardiovascular;;   PERIPHERAL INTRAVASCULAR LITHOTRIPSY Left 04/19/2023   Procedure: PERIPHERAL INTRAVASCULAR LITHOTRIPSY;  Surgeon: Kayla Part, MD;  Location: [facility] INVASIVE CV LAB;  Service: Cardiovascular;  Laterality: Left;   PERIPHERAL VASCULAR BALLOON ANGIOPLASTY Left 04/19/2023   Procedure: PERIPHERAL VASCULAR BALLOON ANGIOPLASTY;  Surgeon: Kayla Part, MD;  Location: [facility] INVASIVE CV LAB;  Service: Cardiovascular;  Laterality: Left;   POLYPECTOMY  04/11/2023   Procedure: POLYPECTOMY;  Surgeon: Elois Hair, MD;  Location: [facility];  Service: Gastroenterology;;   Family History  Adopted: Yes  Problem Relation Age of Onset   Diabetes Mother    Hypertension Mother     Kidney disease Mother    Cancer Father    Diabetes Sister    Hypertension Sister    Social History   Socioeconomic History   Marital status: Married    Spouse name: Not on file   Number of children: Not on file   Years of education: Not on file   Highest education level: Not on file  Occupational History   Not on file  Tobacco Use   Smoking status: Former    Current packs/day: 0.00    Types: Cigarettes    Quit date: 01/30/2022    Years since quitting: 1.5   Smokeless tobacco: Never  Vaping Use   Vaping status: Never Used  Substance and Sexual Activity   Alcohol use: Not Currently   Drug use: No   Sexual activity: Not on file  Other Topics Concern   Not on file  Social History Narrative  Not on file   Social Drivers of Health   Financial Resource Strain: Not on file  Food Insecurity: No Food Insecurity (04/18/2023)   Hunger Vital Sign    Worried About Running Out of Food in the Last Year: Never true    Ran Out of Food in the Last Year: Never true  Transportation Needs: No Transportation Needs (04/18/2023)   PRAPARE - Administrator, Civil Service (Medical): No    Lack of Transportation (Non-Medical): No  Physical Activity: Not on file  Stress: Not on file  Social Connections: Moderately Isolated (02/23/2022)   Social Connection and Isolation Panel [NHANES]    Frequency of Communication with Friends and Family: More than three times a week    Frequency of Social Gatherings with Friends and Family: More than three times a week    Attends Religious Services: Never    Database administrator or Organizations: No    Attends Banker Meetings: Never    Marital Status: Married  Catering manager Violence: Not At Risk (04/18/2023)   Humiliation, Afraid, Rape, and Kick questionnaire    Fear of Current or Ex-Partner: No    Emotionally Abused: No    Physically Abused: No    Sexually Abused: No     Objective  Today's Vitals   08/08/23 1051  BP: (!)  114/59  Pulse: 65  Temp: 98.2 F (36.8 C)  TempSrc: Oral  SpO2: 92%  Weight: 177 lb 6.4 oz (80.5 kg)  Height: 5\' 7"  (1.702 m)  Body mass index is 27.78 kg/m.   Physical Exam Constitutional:      General: He is not in acute distress.    Appearance: He is not ill-appearing.  Cardiovascular:     Rate and Rhythm: Normal rate and regular rhythm.  Pulmonary:     Effort: Pulmonary effort is normal.  Musculoskeletal:        General: Swelling (Right ankle is a little puffy), tenderness (Right lateral lower leg and dorsal aspect of foot) and deformity (Chronic bony mass right lower leg at site of prior surgery for fixation) present.  Skin:    Capillary Refill: Capillary refill takes 2 to 3 seconds.     Coloration: Skin is not pale.     Findings: No erythema.  Neurological:     Mental Status: He is alert.      Assessment & Plan  Problem List Items Addressed This Visit     Essential hypertension   Blood pressure good on current regimen.  Continue amlodipine  5 mg, carvedilol  25 mg twice daily, hydralazine  100 mg 3 times daily, Imdur  90 mg daily.      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  Do not think there is a nerve root problem as his pain does not follow a dermatomal distribution.  No motor deficits.  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  This is not from ischemia or a DVT.  This is not an arthropathy because the joint seems unaffected.  This is not cellulitis either.  I am going to get a leg x-ray to check to see if there is any grossly malpositioned hardware perhaps impinging on the nerve.  Deferring vascular ultrasounds for now.  Treat with a low-dose of gabapentin in the meantime.      Relevant Medications   gabapentin (NEURONTIN) 300 MG capsule   Other Relevant Orders  CBC with Differential/Platelet   DG Ankle Complete Right   Return in about 3 months (around 11/08/2023) for routine  follow-up.  Adria Hopkins MD 08/08/2023, 1:24 PM

## 2023-08-08 NOTE — Assessment & Plan Note (Signed)
 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  Do not think there is a nerve root problem as his pain does not follow a dermatomal distribution.  No motor deficits.  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  This is not from ischemia or a DVT.  This is not an arthropathy because the joint seems unaffected.  This is not cellulitis either.  I am going to get a leg x-ray to check to see if there is any grossly malpositioned hardware perhaps impinging on the nerve.  Deferring vascular ultrasounds for now.  Treat with a low-dose of gabapentin  in the meantime.

## 2023-08-09 ENCOUNTER — Ambulatory Visit: Payer: Self-pay | Admitting: Student

## 2023-08-09 LAB — CBC WITH DIFFERENTIAL/PLATELET
Basophils Absolute: 0.1 10*3/uL (ref 0.0–0.2)
Basos: 1 %
EOS (ABSOLUTE): 0.2 10*3/uL (ref 0.0–0.4)
Eos: 3 %
Hematocrit: 37.6 % (ref 37.5–51.0)
Hemoglobin: 11.7 g/dL — ABNORMAL LOW (ref 13.0–17.7)
Immature Grans (Abs): 0 10*3/uL (ref 0.0–0.1)
Immature Granulocytes: 0 %
Lymphocytes Absolute: 1.4 10*3/uL (ref 0.7–3.1)
Lymphs: 15 %
MCH: 26.7 pg (ref 26.6–33.0)
MCHC: 31.1 g/dL — ABNORMAL LOW (ref 31.5–35.7)
MCV: 86 fL (ref 79–97)
Monocytes Absolute: 1.6 10*3/uL — ABNORMAL HIGH (ref 0.1–0.9)
Monocytes: 17 %
Neutrophils Absolute: 5.9 10*3/uL (ref 1.4–7.0)
Neutrophils: 64 %
Platelets: 329 10*3/uL (ref 150–450)
RBC: 4.39 x10E6/uL (ref 4.14–5.80)
RDW: 16.2 % — ABNORMAL HIGH (ref 11.6–15.4)
WBC: 9.2 10*3/uL (ref 3.4–10.8)

## 2023-08-10 ENCOUNTER — Encounter (HOSPITAL_BASED_OUTPATIENT_CLINIC_OR_DEPARTMENT_OTHER): Admitting: Internal Medicine

## 2023-08-10 DIAGNOSIS — I70235 Atherosclerosis of native arteries of right leg with ulceration of other part of foot: Secondary | ICD-10-CM

## 2023-08-10 DIAGNOSIS — L97518 Non-pressure chronic ulcer of other part of right foot with other specified severity: Secondary | ICD-10-CM

## 2023-08-10 DIAGNOSIS — E11621 Type 2 diabetes mellitus with foot ulcer: Secondary | ICD-10-CM

## 2023-08-14 ENCOUNTER — Encounter (HOSPITAL_COMMUNITY): Payer: Self-pay

## 2023-08-14 ENCOUNTER — Other Ambulatory Visit: Payer: Self-pay

## 2023-08-14 ENCOUNTER — Emergency Department (HOSPITAL_COMMUNITY)

## 2023-08-14 ENCOUNTER — Inpatient Hospital Stay (HOSPITAL_COMMUNITY)
Admission: EM | Admit: 2023-08-14 | Discharge: 2023-08-17 | DRG: 617 | Disposition: A | Discharge status: Home / Self Care | Source: Ambulatory Visit | Attending: Internal Medicine | Admitting: Internal Medicine

## 2023-08-14 DIAGNOSIS — I340 Nonrheumatic mitral (valve) insufficiency: Secondary | ICD-10-CM | POA: Diagnosis present

## 2023-08-14 DIAGNOSIS — Z9862 Peripheral vascular angioplasty status: Secondary | ICD-10-CM

## 2023-08-14 DIAGNOSIS — I5032 Chronic diastolic (congestive) heart failure: Secondary | ICD-10-CM | POA: Diagnosis present

## 2023-08-14 DIAGNOSIS — E1169 Type 2 diabetes mellitus with other specified complication: Principal | ICD-10-CM | POA: Diagnosis present

## 2023-08-14 DIAGNOSIS — I10 Essential (primary) hypertension: Secondary | ICD-10-CM | POA: Diagnosis present

## 2023-08-14 DIAGNOSIS — Z809 Family history of malignant neoplasm, unspecified: Secondary | ICD-10-CM

## 2023-08-14 DIAGNOSIS — Z833 Family history of diabetes mellitus: Secondary | ICD-10-CM

## 2023-08-14 DIAGNOSIS — Z8249 Family history of ischemic heart disease and other diseases of the circulatory system: Secondary | ICD-10-CM

## 2023-08-14 DIAGNOSIS — M869 Osteomyelitis, unspecified: Principal | ICD-10-CM | POA: Diagnosis present

## 2023-08-14 DIAGNOSIS — N186 End stage renal disease: Secondary | ICD-10-CM | POA: Diagnosis present

## 2023-08-14 DIAGNOSIS — I70235 Atherosclerosis of native arteries of right leg with ulceration of other part of foot: Secondary | ICD-10-CM | POA: Diagnosis present

## 2023-08-14 DIAGNOSIS — I77810 Thoracic aortic ectasia: Secondary | ICD-10-CM | POA: Diagnosis present

## 2023-08-14 DIAGNOSIS — R6883 Chills (without fever): Secondary | ICD-10-CM | POA: Insufficient documentation

## 2023-08-14 DIAGNOSIS — Z7982 Long term (current) use of aspirin: Secondary | ICD-10-CM

## 2023-08-14 DIAGNOSIS — D631 Anemia in chronic kidney disease: Secondary | ICD-10-CM | POA: Diagnosis present

## 2023-08-14 DIAGNOSIS — E1152 Type 2 diabetes mellitus with diabetic peripheral angiopathy with gangrene: Secondary | ICD-10-CM | POA: Diagnosis present

## 2023-08-14 DIAGNOSIS — Z992 Dependence on renal dialysis: Secondary | ICD-10-CM

## 2023-08-14 DIAGNOSIS — Z87891 Personal history of nicotine dependence: Secondary | ICD-10-CM

## 2023-08-14 DIAGNOSIS — N2581 Secondary hyperparathyroidism of renal origin: Secondary | ICD-10-CM | POA: Diagnosis present

## 2023-08-14 DIAGNOSIS — Z79899 Other long term (current) drug therapy: Secondary | ICD-10-CM

## 2023-08-14 DIAGNOSIS — E11621 Type 2 diabetes mellitus with foot ulcer: Secondary | ICD-10-CM | POA: Diagnosis present

## 2023-08-14 DIAGNOSIS — I739 Peripheral vascular disease, unspecified: Secondary | ICD-10-CM | POA: Diagnosis present

## 2023-08-14 DIAGNOSIS — L97513 Non-pressure chronic ulcer of other part of right foot with necrosis of muscle: Secondary | ICD-10-CM | POA: Diagnosis present

## 2023-08-14 DIAGNOSIS — Z9889 Other specified postprocedural states: Secondary | ICD-10-CM

## 2023-08-14 DIAGNOSIS — A419 Sepsis, unspecified organism: Secondary | ICD-10-CM | POA: Insufficient documentation

## 2023-08-14 DIAGNOSIS — D72829 Elevated white blood cell count, unspecified: Secondary | ICD-10-CM

## 2023-08-14 DIAGNOSIS — K0889 Other specified disorders of teeth and supporting structures: Secondary | ICD-10-CM | POA: Diagnosis present

## 2023-08-14 DIAGNOSIS — M86171 Other acute osteomyelitis, right ankle and foot: Secondary | ICD-10-CM | POA: Diagnosis present

## 2023-08-14 DIAGNOSIS — R52 Pain, unspecified: Secondary | ICD-10-CM | POA: Insufficient documentation

## 2023-08-14 DIAGNOSIS — Z860100 Personal history of colon polyps, unspecified: Secondary | ICD-10-CM

## 2023-08-14 DIAGNOSIS — I132 Hypertensive heart and chronic kidney disease with heart failure and with stage 5 chronic kidney disease, or end stage renal disease: Secondary | ICD-10-CM | POA: Diagnosis present

## 2023-08-14 DIAGNOSIS — D649 Anemia, unspecified: Secondary | ICD-10-CM | POA: Diagnosis present

## 2023-08-14 DIAGNOSIS — E1122 Type 2 diabetes mellitus with diabetic chronic kidney disease: Secondary | ICD-10-CM | POA: Diagnosis present

## 2023-08-14 DIAGNOSIS — Z888 Allergy status to other drugs, medicaments and biological substances status: Secondary | ICD-10-CM

## 2023-08-14 DIAGNOSIS — M79604 Pain in right leg: Secondary | ICD-10-CM | POA: Diagnosis present

## 2023-08-14 DIAGNOSIS — E785 Hyperlipidemia, unspecified: Secondary | ICD-10-CM | POA: Diagnosis present

## 2023-08-14 DIAGNOSIS — Z7902 Long term (current) use of antithrombotics/antiplatelets: Secondary | ICD-10-CM

## 2023-08-14 DIAGNOSIS — Z841 Family history of disorders of kidney and ureter: Secondary | ICD-10-CM

## 2023-08-14 HISTORY — DX: End stage renal disease: N18.6

## 2023-08-14 HISTORY — DX: End stage renal disease: Z99.2

## 2023-08-14 LAB — BASIC METABOLIC PANEL WITH GFR
Anion gap: 16 — ABNORMAL HIGH (ref 5–15)
BUN: 24 mg/dL — ABNORMAL HIGH (ref 6–20)
CO2: 26 mmol/L (ref 22–32)
Calcium: 8.8 mg/dL — ABNORMAL LOW (ref 8.9–10.3)
Chloride: 95 mmol/L — ABNORMAL LOW (ref 98–111)
Creatinine, Ser: 4.13 mg/dL — ABNORMAL HIGH (ref 0.61–1.24)
GFR, Estimated: 16 mL/min — ABNORMAL LOW (ref >60)
Glucose, Bld: 128 mg/dL — ABNORMAL HIGH (ref 70–99)
Potassium: 3.6 mmol/L (ref 3.5–5.1)
Sodium: 137 mmol/L (ref 135–145)

## 2023-08-14 LAB — CBC
HCT: 34.6 % — ABNORMAL LOW (ref 39.0–52.0)
HCT: 34 % — ABNORMAL LOW (ref 39.0–52.0)
Hemoglobin: 10.7 g/dL — ABNORMAL LOW (ref 13.0–17.0)
Hemoglobin: 10.7 g/dL — ABNORMAL LOW (ref 13.0–17.0)
MCH: 26.7 pg (ref 26.0–34.0)
MCH: 27.2 pg (ref 26.0–34.0)
MCHC: 30.9 g/dL (ref 30.0–36.0)
MCHC: 31.5 g/dL (ref 30.0–36.0)
MCV: 86.3 fL (ref 80.0–100.0)
MCV: 86.3 fL (ref 80.0–100.0)
Platelets: 334 10*3/uL (ref 150–400)
Platelets: 354 10*3/uL (ref 150–400)
RBC: 4.01 MIL/uL — ABNORMAL LOW (ref 4.22–5.81)
RBC: 3.94 MIL/uL — ABNORMAL LOW (ref 4.22–5.81)
RDW: 17.6 % — ABNORMAL HIGH (ref 11.5–15.5)
RDW: 17.7 % — ABNORMAL HIGH (ref 11.5–15.5)
WBC: 16.6 10*3/uL — ABNORMAL HIGH (ref 4.0–10.5)
WBC: 16.1 10*3/uL — ABNORMAL HIGH (ref 4.0–10.5)
nRBC: 0 % (ref 0.0–0.2)
nRBC: 0 % (ref 0.0–0.2)

## 2023-08-14 LAB — DIFFERENTIAL
Abs Immature Granulocytes: 0.1 10*3/uL — ABNORMAL HIGH (ref 0.00–0.07)
Basophils Absolute: 0 10*3/uL (ref 0.0–0.1)
Basophils Relative: 0 %
Eosinophils Absolute: 0.1 10*3/uL (ref 0.0–0.5)
Eosinophils Relative: 0 %
Immature Granulocytes: 1 %
Lymphocytes Relative: 6 %
Lymphs Abs: 1 10*3/uL (ref 0.7–4.0)
Monocytes Absolute: 1.9 10*3/uL — ABNORMAL HIGH (ref 0.1–1.0)
Monocytes Relative: 12 %
Neutro Abs: 13 10*3/uL — ABNORMAL HIGH (ref 1.7–7.7)
Neutrophils Relative %: 81 %

## 2023-08-14 LAB — I-STAT CG4 LACTIC ACID, ED: Lactic Acid, Venous: 1 mmol/L (ref 0.5–1.9)

## 2023-08-14 MED ORDER — VANCOMYCIN HCL IN DEXTROSE 1-5 GM/200ML-% IV SOLN: 1000.0000 mg | Freq: Once | INTRAVENOUS | Status: DC

## 2023-08-14 MED ORDER — MORPHINE SULFATE (PF) 2 MG/ML IV SOLN
2.0000 mg | Freq: Once | INTRAVENOUS | Status: AC
  Administered 2023-08-14: 2 mg via INTRAVENOUS
  Filled 2023-08-14: qty 1

## 2023-08-14 MED ORDER — SODIUM CHLORIDE 0.9 % IV SOLN
3.0000 g | Freq: Once | INTRAVENOUS | Status: DC
  Administered 2023-08-14: 3 g via INTRAVENOUS
  Filled 2023-08-14: qty 8

## 2023-08-14 MED ORDER — ACETAMINOPHEN 500 MG PO TABS
1000.0000 mg | ORAL_TABLET | Freq: Once | ORAL | Status: AC
  Administered 2023-08-14: 1000 mg via ORAL
  Filled 2023-08-14: qty 2

## 2023-08-14 MED ORDER — VANCOMYCIN HCL 1750 MG/350ML IV SOLN
1750.0000 mg | Freq: Once | INTRAVENOUS | Status: DC
  Filled 2023-08-14: qty 350

## 2023-08-14 MED ORDER — SODIUM CHLORIDE 0.9 % IV SOLN: 3.0000 g | Freq: Once | INTRAVENOUS | Status: DC

## 2023-08-14 NOTE — Progress Notes (Signed)
 Internal Medicine Clinic Attending  I was physically present during the key portions of the resident provided service and participated in the medical decision making of patient's management care. I reviewed pertinent patient test results.  The assessment, diagnosis, and plan were formulated together and I agree with the documentation in the resident's note.  Carney Living, MD

## 2023-08-14 NOTE — ED Provider Triage Note (Signed)
 Emergency Medicine Provider Triage Evaluation Note  Samuel Gardner , a 53 y.o. male  was evaluated in triage.  Pt complains of sent from dialysis due to concern for wound infection of right foot.  He has a chronic ulcer that appears worse, with worsening pain, elevated temperature and rigors reported at dialysis.  99.9 temp on arrival.  He had x-rays done recently but they have not been read.  He has been weak, bedbound since his last dialysis session. Cefepime and Vanc prior to arrival  Review of Systems  Positive: Foot wound, concern for osteo Negative:   Physical Exam  BP (!) 142/72 (BP Location: Right Arm)   Pulse 73   Temp 99.9 F (37.7 C)   Resp 19   Ht 5\' 7"  (1.702 m)   Wt 80.3 kg   SpO2 95%   BMI 27.72 kg/m  Gen:   Awake, no distress   Resp:  Normal effort  MSK:   Moves extremities without difficulty  Other:  Focally tender to the touch large ulcer of the right foot on the medial aspect of the great toe MTP.  Tenderness throughout the foot and ankle.  Medical Decision Making  Medically screening exam initiated at 6:29 PM.  Appropriate orders placed.  Samuel Gardner was informed that the remainder of the evaluation will be completed by another provider, this initial triage assessment does not replace that evaluation, and the importance of remaining in the ED until their evaluation is complete.  Workup initiated in triage    Nelly Banco, [place] 08/14/23 1830

## 2023-08-14 NOTE — ED Provider Notes (Signed)
 [place] EMERGENCY DEPARTMENT AT [facility] Provider Note   CSN: [id number] Arrival date & time: 08/14/23  1700     History  Chief Complaint  Patient presents with   Leg Pain    Samuel Gardner is a 53 y.o. male.  Patient with past medical history ESRD on hemodialysis Monday, Wednesday, Friday, peripheral artery disease, type II DM heart failure with preserved ejection fraction presents to the emergency department complaining of a diabetic ulcer to the right great toe on the medial portion.  He previously had a wound on the left foot and was seen by wound care and was discharged a few weeks ago.  That wound has been healing well.  3 to 4 weeks ago and wound appeared on the right foot.  There has been some drainage and a foul odor from the wound.  He was at dialysis earlier today and he began to have pain in that foot and was administered a dose of cefepime and vancomycin reportedly during his treatment.  Patient denies shortness of breath, abdominal pain, nausea, vomiting.  He does endorse a fever and was febrile here in the emergency department with a temperature of 101.5 F.  He completed his dialysis treatment earlier today.   Leg Pain      Home Medications Prior to Admission medications   Medication Sig Start Date End Date Taking? Authorizing Provider  acetaminophen  (TYLENOL ) 500 MG tablet Take 1,000 mg by mouth every 6 (six) hours as needed for mild pain (pain score 1-3) or headache.    [provider]  albuterol  (VENTOLIN  HFA) 108 (90 Base) MCG/ACT inhaler Inhale 2 puffs into the lungs every 6 (six) hours as needed for wheezing or shortness of breath. 02/02/22   [name], [name], MD  amLODipine  (NORVASC ) 5 MG tablet Take 1 tablet (5 mg total) by mouth daily. 05/04/23   Cleven Dallas, DO  aspirin  EC 81 MG tablet Take 1 tablet (81 mg total) by mouth daily. Swallow whole. 05/02/23   Cleven Dallas, DO  carvedilol  (COREG ) 25 MG tablet TAKE 1 TABLET 2 TIMES  DAILY 03/17/23   Jacqueline Matsu, MD  clopidogrel  (PLAVIX ) 75 MG tablet TAKE 1 TABLET(75 MG) BY MOUTH DAILY WITH BREAKFAST 08/10/23   Cleven Dallas, DO  furosemide  (LASIX ) 80 MG tablet TAKE 1 TABLET BY MOUTH TWICE DAILY 07/10/23   Cleven Dallas, DO  gabapentin  (NEURONTIN ) 300 MG capsule Take 1 capsule (300 mg total) by mouth at bedtime. 08/08/23 08/07/24  Adria Hopkins, MD  hydrALAZINE  (APRESOLINE ) 100 MG tablet Take 1 tablet (100 mg total) by mouth 3 (three) times daily. 11/09/22   Jacqueline Matsu, MD  isosorbide  mononitrate (IMDUR ) 30 MG 24 hr tablet TAKE 3 TABLETS(90 MG) BY MOUTH DAILY 06/16/23   Flo Hummingbird, [name]  nitroGLYCERIN  (NITROSTAT ) 0.4 MG SL tablet Place 1 tablet (0.4 mg total) under the tongue every 5 (five) minutes as needed for chest pain. 02/07/22   Von Grumbling, [name]  polyethylene glycol powder (GAVILAX) 17 GM/SCOOP powder Take 17 g by mouth daily as needed for mild constipation. 11/12/22   Cleven Dallas, DO  rosuvastatin  (CRESTOR ) 20 MG tablet Take 1 tablet (20 mg total) by mouth daily. 05/02/23   Cleven Dallas, DO  Spacer/Aero-Holding Chambers (PRO COMFORT SPACER ADULT) MISC Use spacer as needed with inhalers. 05/03/23   Cleven Dallas, DO      Allergies    Atorvastatin     Review of Systems   Review of Systems  Physical  Exam Updated Vital Signs BP 131/67   Pulse 71   Temp 98.8 F (37.1 C) (Oral)   Resp 18   Ht 5\' 7"  (1.702 m)   Wt 80.3 kg   SpO2 98%   BMI 27.72 kg/m  Physical Exam Vitals and nursing note reviewed.  Constitutional:      General: He is not in acute distress.    Appearance: He is well-developed.  HENT:     Head: Normocephalic and atraumatic.  Eyes:     Conjunctiva/sclera: Conjunctivae normal.  Cardiovascular:     Rate and Rhythm: Normal rate and regular rhythm.  Pulmonary:     Effort: Pulmonary effort is normal. No respiratory distress.     Breath sounds: Normal breath sounds.  Abdominal:     Palpations: Abdomen is soft.      Tenderness: There is no abdominal tenderness.  Musculoskeletal:        General: No swelling.     Cervical back: Neck supple.  Skin:    General: Skin is warm and dry.     Capillary Refill: Capillary refill takes less than 2 seconds.     Findings: Lesion present.     Comments: See image below of wound on right great toe  Neurological:     Mental Status: He is alert.  Psychiatric:        Mood and Affect: Mood normal.     ED Results / Procedures / Treatments   Labs (all labs ordered are listed, but only abnormal results are displayed) Labs Reviewed  CBC - Abnormal; Notable for the following components:      Result Value   WBC 16.6 (*)    RBC 4.01 (*)    Hemoglobin 10.7 (*)    HCT 34.6 (*)    RDW 17.6 (*)    All other components within normal limits  BASIC METABOLIC PANEL WITH GFR - Abnormal; Notable for the following components:   Chloride 95 (*)    Glucose, Bld 128 (*)    BUN 24 (*)    Creatinine, Ser 4.13 (*)    Calcium  8.8 (*)    GFR, Estimated 16 (*)    Anion gap 16 (*)    All other components within normal limits  DIFFERENTIAL - Abnormal; Notable for the following components:   Neutro Abs 13.0 (*)    Monocytes Absolute 1.9 (*)    Abs Immature Granulocytes 0.10 (*)    All other components within normal limits  CBC - Abnormal; Notable for the following components:   WBC 16.1 (*)    RBC 3.94 (*)    Hemoglobin 10.7 (*)    HCT 34.0 (*)    RDW 17.7 (*)    All other components within normal limits  CULTURE, BLOOD (ROUTINE X 2)  CULTURE, BLOOD (ROUTINE X 2)  I-STAT CG4 LACTIC ACID, ED    EKG None  Radiology DG Ankle Complete Right Result Date: 08/14/2023 CLINICAL DATA:  Wound on the foot. EXAM: RIGHT ANKLE - COMPLETE 3+ VIEW COMPARISON:  None Available. FINDINGS: There is no acute fracture or focal osseous lesion. There is no dislocation. Tibial intramedullary nail and distal screw appear uncomplicated. There is soft tissue swelling surrounding the ankle. Peripheral  vascular calcifications are present. IMPRESSION: 1. No acute fracture or dislocation. 2. Soft tissue swelling surrounding the ankle. Electronically Signed   By: Tyron Gallon M.D.   On: 08/14/2023 19:52   DG Foot Complete Right Result Date: 08/14/2023 CLINICAL DATA:  Wound on  the foot plate EXAM: RIGHT FOOT COMPLETE - 3+ VIEW COMPARISON:  None Available. FINDINGS: There are focal lucencies and cortical erosions in the medial and lateral aspect of the first metatarsal head. There is overlying soft tissue swelling. No acute fracture or dislocation. Peripheral vascular calcifications are present. IMPRESSION: Focal lucencies and cortical erosions in the medial and lateral aspect of the first metatarsal head, concerning for osteomyelitis. Electronically Signed   By: Tyron Gallon M.D.   On: 08/14/2023 19:49    Procedures Procedures    Medications Ordered in ED Medications  Ampicillin-Sulbactam (UNASYN) 3 g in sodium chloride  0.9 % 100 mL IVPB (has no administration in time range)    And  vancomycin (VANCOREADY) IVPB 1750 mg/350 mL (has no administration in time range)  morphine (PF) 2 MG/ML injection 2 mg (has no administration in time range)  acetaminophen  (TYLENOL ) tablet 1,000 mg (1,000 mg Oral Given 08/14/23 2003)    ED Course/ Medical Decision Making/ A&P                                 Medical Decision Making Amount and/or Complexity of Data Reviewed Labs: ordered.  Risk Prescription drug management.   This patient presents to the ED for concern of foot pain, this involves an extensive number of treatment options, and is a complaint that carries with it a high risk of complications and morbidity.  The differential diagnosis includes fracture, dislocation, soft tissue injury, infection, others   Co morbidities / Chronic conditions that complicate the patient evaluation  ESRD on Monday Wednesday Friday hemodialysis, type II DM   Additional history obtained:  Additional history  obtained from EMR External records from outside source obtained and reviewed including internal medicine notes   Lab Tests:  I Ordered, and personally interpreted labs.  The pertinent results include: Leukocytosis with a white count of 16,600 (9,200 6 days ago)   Imaging Studies ordered:  I ordered imaging studies including plain films of the right ankle and right foot I independently visualized and interpreted imaging which showed soft tissue swelling of the right ankle.  Right foot films: Focal lucencies and cortical erosions in the medial and lateral  aspect of the first metatarsal head, concerning for osteomyelitis.   I agree with the radiologist interpretation   Problem List / ED Course / Critical interventions / Medication management   I ordered medication including morphine for pain, Tylenol  for fever, Unasyn and vancomycin for infection Reevaluation of the patient after these medicines showed that the patient improved I have reviewed the patients home medicines and have made adjustments as needed   Consultations Obtained:  I requested consultation with the internal medicine team,  and discussed lab and imaging findings as well as pertinent plan - they recommend: admission   Social Determinants of Health:  Patient is a former smoker   Test / Admission - Considered:  Patient with imaging concerning for osteomyelitis of the first metatarsal.  Plan to admit patient for antibiotic therapy.  He also has a leukocytosis.  He does not appear septic at this time.  Patient would likely benefit from podiatry consult while in hospital.         Final Clinical Impression(s) / ED Diagnoses Final diagnoses:  Osteomyelitis of metatarsal (HCC)  Leukocytosis, unspecified type    Rx / DC Orders ED Discharge Orders     None         Grettel Rames,  Antone Batten, [name] 08/14/23 2249    Karlyn Overman, MD 08/15/23 [phone]

## 2023-08-14 NOTE — Hospital Course (Addendum)
 Principal Problem:   Osteomyelitis (HCC) Active Problems:   ESRD on dialysis [facility])   Essential hypertension   Normocytic anemia   PAD (peripheral artery disease) (HCC)   Right leg pain   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)  Resolved Problems:   * No resolved hospital problems. *  Consults:***  Procedures:***  Follow-up items:***

## 2023-08-14 NOTE — ED Triage Notes (Signed)
 Pt was at dialysis today (full treatment today) and was told he has an infection because he was shaking in the chair uncontrollably. They adm Vancomycin and Cefepime during his treatment. He reports severe right leg pain, very painful to touch and he also has a diabetic ulcer to right foot where the joint is at his big toe. He is being seen by wound care. Denies fevers.

## 2023-08-15 ENCOUNTER — Encounter (HOSPITAL_COMMUNITY): Payer: Self-pay | Admitting: Internal Medicine

## 2023-08-15 ENCOUNTER — Inpatient Hospital Stay (HOSPITAL_COMMUNITY)

## 2023-08-15 DIAGNOSIS — I10 Essential (primary) hypertension: Secondary | ICD-10-CM | POA: Diagnosis not present

## 2023-08-15 DIAGNOSIS — E1151 Type 2 diabetes mellitus with diabetic peripheral angiopathy without gangrene: Secondary | ICD-10-CM | POA: Diagnosis not present

## 2023-08-15 DIAGNOSIS — Z992 Dependence on renal dialysis: Secondary | ICD-10-CM | POA: Diagnosis not present

## 2023-08-15 DIAGNOSIS — Z9889 Other specified postprocedural states: Secondary | ICD-10-CM | POA: Diagnosis not present

## 2023-08-15 DIAGNOSIS — M86171 Other acute osteomyelitis, right ankle and foot: Secondary | ICD-10-CM | POA: Diagnosis present

## 2023-08-15 DIAGNOSIS — E1122 Type 2 diabetes mellitus with diabetic chronic kidney disease: Secondary | ICD-10-CM | POA: Diagnosis present

## 2023-08-15 DIAGNOSIS — D631 Anemia in chronic kidney disease: Secondary | ICD-10-CM | POA: Diagnosis present

## 2023-08-15 DIAGNOSIS — Z841 Family history of disorders of kidney and ureter: Secondary | ICD-10-CM | POA: Diagnosis not present

## 2023-08-15 DIAGNOSIS — I340 Nonrheumatic mitral (valve) insufficiency: Secondary | ICD-10-CM | POA: Diagnosis present

## 2023-08-15 DIAGNOSIS — N186 End stage renal disease: Secondary | ICD-10-CM | POA: Diagnosis present

## 2023-08-15 DIAGNOSIS — E1152 Type 2 diabetes mellitus with diabetic peripheral angiopathy with gangrene: Secondary | ICD-10-CM | POA: Diagnosis present

## 2023-08-15 DIAGNOSIS — M869 Osteomyelitis, unspecified: Secondary | ICD-10-CM | POA: Diagnosis present

## 2023-08-15 DIAGNOSIS — E1169 Type 2 diabetes mellitus with other specified complication: Principal | ICD-10-CM

## 2023-08-15 DIAGNOSIS — K0889 Other specified disorders of teeth and supporting structures: Secondary | ICD-10-CM | POA: Diagnosis present

## 2023-08-15 DIAGNOSIS — Z833 Family history of diabetes mellitus: Secondary | ICD-10-CM | POA: Diagnosis not present

## 2023-08-15 DIAGNOSIS — I132 Hypertensive heart and chronic kidney disease with heart failure and with stage 5 chronic kidney disease, or end stage renal disease: Secondary | ICD-10-CM | POA: Diagnosis present

## 2023-08-15 DIAGNOSIS — I7090 Unspecified atherosclerosis: Secondary | ICD-10-CM | POA: Diagnosis not present

## 2023-08-15 DIAGNOSIS — Z79899 Other long term (current) drug therapy: Secondary | ICD-10-CM

## 2023-08-15 DIAGNOSIS — Z87891 Personal history of nicotine dependence: Secondary | ICD-10-CM

## 2023-08-15 DIAGNOSIS — Z7982 Long term (current) use of aspirin: Secondary | ICD-10-CM

## 2023-08-15 DIAGNOSIS — E785 Hyperlipidemia, unspecified: Secondary | ICD-10-CM | POA: Diagnosis present

## 2023-08-15 DIAGNOSIS — Z7902 Long term (current) use of antithrombotics/antiplatelets: Secondary | ICD-10-CM

## 2023-08-15 DIAGNOSIS — L97513 Non-pressure chronic ulcer of other part of right foot with necrosis of muscle: Secondary | ICD-10-CM | POA: Diagnosis present

## 2023-08-15 DIAGNOSIS — E11621 Type 2 diabetes mellitus with foot ulcer: Secondary | ICD-10-CM | POA: Diagnosis present

## 2023-08-15 DIAGNOSIS — I77810 Thoracic aortic ectasia: Secondary | ICD-10-CM | POA: Diagnosis present

## 2023-08-15 DIAGNOSIS — Z9862 Peripheral vascular angioplasty status: Secondary | ICD-10-CM | POA: Diagnosis not present

## 2023-08-15 DIAGNOSIS — N2581 Secondary hyperparathyroidism of renal origin: Secondary | ICD-10-CM | POA: Diagnosis present

## 2023-08-15 DIAGNOSIS — I70235 Atherosclerosis of native arteries of right leg with ulceration of other part of foot: Secondary | ICD-10-CM | POA: Diagnosis present

## 2023-08-15 DIAGNOSIS — I5032 Chronic diastolic (congestive) heart failure: Secondary | ICD-10-CM | POA: Diagnosis present

## 2023-08-15 DIAGNOSIS — Z8249 Family history of ischemic heart disease and other diseases of the circulatory system: Secondary | ICD-10-CM | POA: Diagnosis not present

## 2023-08-15 LAB — BASIC METABOLIC PANEL WITH GFR
Anion gap: 14 (ref 5–15)
BUN: 35 mg/dL — ABNORMAL HIGH (ref 6–20)
CO2: 29 mmol/L (ref 22–32)
Calcium: 8.7 mg/dL — ABNORMAL LOW (ref 8.9–10.3)
Chloride: 97 mmol/L — ABNORMAL LOW (ref 98–111)
Creatinine, Ser: 5.57 mg/dL — ABNORMAL HIGH (ref 0.61–1.24)
GFR, Estimated: 11 mL/min — ABNORMAL LOW (ref >60)
Glucose, Bld: 98 mg/dL (ref 70–99)
Potassium: 3.7 mmol/L (ref 3.5–5.1)
Sodium: 140 mmol/L (ref 135–145)

## 2023-08-15 LAB — HEPATITIS B SURFACE ANTIGEN: Hepatitis B Surface Ag: NONREACTIVE

## 2023-08-15 LAB — CBC
HCT: 30.9 % — ABNORMAL LOW (ref 39.0–52.0)
Hemoglobin: 9.7 g/dL — ABNORMAL LOW (ref 13.0–17.0)
MCH: 26.6 pg (ref 26.0–34.0)
MCHC: 31.4 g/dL (ref 30.0–36.0)
MCV: 84.7 fL (ref 80.0–100.0)
Platelets: 318 10*3/uL (ref 150–400)
RBC: 3.65 MIL/uL — ABNORMAL LOW (ref 4.22–5.81)
RDW: 17.9 % — ABNORMAL HIGH (ref 11.5–15.5)
WBC: 14.8 10*3/uL — ABNORMAL HIGH (ref 4.0–10.5)
nRBC: 0 % (ref 0.0–0.2)

## 2023-08-15 LAB — VANCOMYCIN, RANDOM: Vancomycin Rm: 19 ug/mL

## 2023-08-15 MED ORDER — CHLORHEXIDINE GLUCONATE CLOTH 2 % EX PADS
6.0000 | MEDICATED_PAD | Freq: Every day | CUTANEOUS | Status: DC
  Administered 2023-08-16 – 2023-08-17 (×2): 6 via TOPICAL

## 2023-08-15 MED ORDER — VANCOMYCIN HCL 750 MG/150ML IV SOLN: 750.0000 mg | INTRAVENOUS | Status: DC

## 2023-08-15 MED ORDER — FUROSEMIDE 40 MG PO TABS
80.0000 mg | ORAL_TABLET | Freq: Two times a day (BID) | ORAL | Status: DC
  Administered 2023-08-15 – 2023-08-17 (×5): 80 mg via ORAL
  Filled 2023-08-15 (×4): qty 2
  Filled 2023-08-15: qty 4

## 2023-08-15 MED ORDER — ROSUVASTATIN CALCIUM 5 MG PO TABS
10.0000 mg | ORAL_TABLET | Freq: Every day | ORAL | Status: DC
  Administered 2023-08-16 – 2023-08-17 (×2): 10 mg via ORAL
  Filled 2023-08-15 (×2): qty 2

## 2023-08-15 MED ORDER — ISOSORBIDE MONONITRATE ER 60 MG PO TB24
90.0000 mg | ORAL_TABLET | Freq: Every day | ORAL | Status: DC
  Administered 2023-08-15 – 2023-08-17 (×3): 90 mg via ORAL
  Filled 2023-08-15 (×2): qty 1
  Filled 2023-08-15: qty 3

## 2023-08-15 MED ORDER — SODIUM CHLORIDE 0.9 % IV SOLN
1.0000 g | INTRAVENOUS | Status: DC
  Administered 2023-08-15 – 2023-08-16 (×2): 1 g via INTRAVENOUS
  Filled 2023-08-15 (×3): qty 10

## 2023-08-15 MED ORDER — AMLODIPINE BESYLATE 5 MG PO TABS
5.0000 mg | ORAL_TABLET | Freq: Every day | ORAL | Status: DC
  Administered 2023-08-15 – 2023-08-17 (×3): 5 mg via ORAL
  Filled 2023-08-15 (×3): qty 1

## 2023-08-15 MED ORDER — GABAPENTIN 300 MG PO CAPS
300.0000 mg | ORAL_CAPSULE | Freq: Every day | ORAL | Status: DC
  Administered 2023-08-15 – 2023-08-16 (×3): 300 mg via ORAL
  Filled 2023-08-15 (×3): qty 1

## 2023-08-15 MED ORDER — HYDRALAZINE HCL 50 MG PO TABS
100.0000 mg | ORAL_TABLET | Freq: Three times a day (TID) | ORAL | Status: DC
  Administered 2023-08-15 – 2023-08-17 (×6): 100 mg via ORAL
  Filled 2023-08-15 (×8): qty 2

## 2023-08-15 MED ORDER — HEPARIN SODIUM (PORCINE) 5000 UNIT/ML IJ SOLN
5000.0000 [IU] | Freq: Three times a day (TID) | INTRAMUSCULAR | Status: DC
  Administered 2023-08-15 – 2023-08-17 (×8): 5000 [IU] via SUBCUTANEOUS
  Filled 2023-08-15 (×8): qty 1

## 2023-08-15 MED ORDER — FENTANYL CITRATE PF 50 MCG/ML IJ SOSY: 50.0000 ug | PREFILLED_SYRINGE | INTRAMUSCULAR | Status: DC | PRN

## 2023-08-15 MED ORDER — ACETAMINOPHEN 500 MG PO TABS
1000.0000 mg | ORAL_TABLET | Freq: Four times a day (QID) | ORAL | Status: DC
  Administered 2023-08-15 – 2023-08-17 (×8): 1000 mg via ORAL
  Filled 2023-08-15 (×9): qty 2

## 2023-08-15 MED ORDER — ASPIRIN 81 MG PO TBEC
81.0000 mg | DELAYED_RELEASE_TABLET | Freq: Every day | ORAL | Status: DC
  Administered 2023-08-15 – 2023-08-17 (×4): 81 mg via ORAL
  Filled 2023-08-15 (×4): qty 1

## 2023-08-15 MED ORDER — VANCOMYCIN VARIABLE DOSE PER UNSTABLE RENAL FUNCTION (PHARMACIST DOSING): Status: DC

## 2023-08-15 MED ORDER — ROSUVASTATIN CALCIUM 20 MG PO TABS
20.0000 mg | ORAL_TABLET | Freq: Every day | ORAL | Status: DC
  Administered 2023-08-15: 20 mg via ORAL
  Filled 2023-08-15: qty 1

## 2023-08-15 MED ORDER — CARVEDILOL 25 MG PO TABS
25.0000 mg | ORAL_TABLET | Freq: Two times a day (BID) | ORAL | Status: DC
  Administered 2023-08-15 – 2023-08-17 (×5): 25 mg via ORAL
  Filled 2023-08-15: qty 1
  Filled 2023-08-15: qty 2
  Filled 2023-08-15: qty 1
  Filled 2023-08-15: qty 2
  Filled 2023-08-15: qty 1

## 2023-08-15 MED ORDER — FENTANYL CITRATE PF 50 MCG/ML IJ SOSY
25.0000 ug | PREFILLED_SYRINGE | INTRAMUSCULAR | Status: DC | PRN
  Administered 2023-08-15 – 2023-08-16 (×2): 25 ug via INTRAVENOUS
  Filled 2023-08-15 (×2): qty 1

## 2023-08-15 NOTE — Consult Note (Signed)
 Renal Service Consult Note [facility]  Samuel Gardner 08/15/2023 Lynae Sandifer, MD Requesting Physician: Dr. [name]  Reason for Consult: ESRD patient presenting with rigors HPI: The patient is a 53 y.o. year-old w/ PMH as below who presented to ED yesterday evening.  He was shaking in the chair uncontrollably. They administered IV vancomycin and cefepime. He reported severe right leg pain and a foot ulcer on the right foot. He was told to go to the emergency room.  In the ED blood pressure 142/72, HR 73, RR 18, temp 99.8, 95% on room air.  K+ 3.6, BUN 24, creatinine 4.1, WBC 16, Hgb 10.7.  Blood cultures were sent.  X-ray of the right foot showed osteomyelitis. Pt was admitted. We are asked to see for dialysis.    Pt seen in room. Denies any SOB, leg swelling. HD has been going well in OP setting. Says he started in Jan 2025. He says he was here once with an "infiltration" of his access which happened on his 3rd treatment. They admitted him and dialyzed him here upstairs one time. Over time the swelling in the L arm resolved. No further problems w/ access.    ROS - denies CP, no joint pain, no HA, no blurry vision, no rash, no diarrhea, no nausea/ vomiting  PMH: HFpED Ascending aorta dilatation ESRD on HD DM type 2 HTN Anemia   Past Surgical History  Past Surgical History:  Procedure Laterality Date   ABDOMINAL AORTOGRAM N/A 06/07/2023   Procedure: ABDOMINAL AORTOGRAM;  Surgeon: Kayla Part, MD;  Location: [facility] INVASIVE CV LAB;  Service: Cardiovascular;  Laterality: N/A;   ABDOMINAL AORTOGRAM W/LOWER EXTREMITY Bilateral 04/19/2023   Procedure: ABDOMINAL AORTOGRAM W/LOWER EXTREMITY;  Surgeon: Kayla Part, MD;  Location: [facility] INVASIVE CV LAB;  Service: Cardiovascular;  Laterality: Bilateral;   AV FISTULA PLACEMENT Left 12/13/2022   Procedure: LEFT ARM BRACHIOCEPHALIC ARTERIOVENOUS (AV) FISTULA CREATION;  Surgeon: Kayla Part, MD;  Location: [facility] OR;  Service:  Vascular;  Laterality: Left;   COLONOSCOPY WITH PROPOFOL  N/A 04/11/2023   Procedure: COLONOSCOPY WITH PROPOFOL ;  Surgeon: Elois Hair, MD;  Location: [facility] ENDOSCOPY;  Service: Gastroenterology;  Laterality: N/A;   LEG SURGERY Right    teenager   LOWER EXTREMITY ANGIOGRAPHY N/A 06/07/2023   Procedure: Lower Extremity Angiography;  Surgeon: Kayla Part, MD;  Location: [facility] INVASIVE CV LAB;  Service: Cardiovascular;  Laterality: N/A;   LOWER EXTREMITY INTERVENTION  06/07/2023   Procedure: LOWER EXTREMITY INTERVENTION;  Surgeon: Kayla Part, MD;  Location: [facility] INVASIVE CV LAB;  Service: Cardiovascular;;   PERIPHERAL INTRAVASCULAR LITHOTRIPSY Left 04/19/2023   Procedure: PERIPHERAL INTRAVASCULAR LITHOTRIPSY;  Surgeon: Kayla Part, MD;  Location: [facility] INVASIVE CV LAB;  Service: Cardiovascular;  Laterality: Left;   PERIPHERAL VASCULAR BALLOON ANGIOPLASTY Left 04/19/2023   Procedure: PERIPHERAL VASCULAR BALLOON ANGIOPLASTY;  Surgeon: Kayla Part, MD;  Location: [facility] INVASIVE CV LAB;  Service: Cardiovascular;  Laterality: Left;   POLYPECTOMY  04/11/2023   Procedure: POLYPECTOMY;  Surgeon: Elois Hair, MD;  Location: [facility];  Service: Gastroenterology;;   Family History  Family History  Adopted: Yes  Problem Relation Age of Onset   Diabetes Mother    Hypertension Mother    Kidney disease Mother    Cancer Father    Diabetes Sister    Hypertension Sister    Social History  reports that he quit smoking about 18 months ago. His smoking use included cigarettes. He has never  used smokeless tobacco. He reports that he does not currently use alcohol. He reports that he does not use drugs. Allergies  Allergies  Allergen Reactions   Atorvastatin      Muscle cramping   Home medications Prior to Admission medications   Medication Sig Start Date End Date Taking? Authorizing Provider  acetaminophen  (TYLENOL ) 500 MG tablet Take 1,000 mg by mouth every 6 (six) hours as needed for  mild pain (pain score 1-3) or headache.   Yes [provider]  albuterol  (VENTOLIN  HFA) 108 (90 Base) MCG/ACT inhaler Inhale 2 puffs into the lungs every 6 (six) hours as needed for wheezing or shortness of breath. 02/02/22  Yes [name], [name], MD  amLODipine  (NORVASC ) 5 MG tablet Take 1 tablet (5 mg total) by mouth daily. 05/04/23  Yes Cleven Dallas, DO  aspirin  EC 81 MG tablet Take 1 tablet (81 mg total) by mouth daily. Swallow whole. 05/02/23  Yes Cleven Dallas, DO  calcitRIOL (ROCALTROL) 0.25 MCG capsule Take 0.25 mcg by mouth daily. 06/19/23  Yes [provider]  carvedilol  (COREG ) 25 MG tablet TAKE 1 TABLET 2 TIMES DAILY 03/17/23  Yes [name], Rufus Council, MD  clopidogrel  (PLAVIX ) 75 MG tablet TAKE 1 TABLET(75 MG) BY MOUTH DAILY WITH BREAKFAST 08/10/23  Yes Cleven Dallas, DO  furosemide  (LASIX ) 80 MG tablet TAKE 1 TABLET BY MOUTH TWICE DAILY 07/10/23  Yes Cleven Dallas, DO  gabapentin  (NEURONTIN ) 300 MG capsule Take 1 capsule (300 mg total) by mouth at bedtime. 08/08/23 08/07/24 Yes Adria Hopkins, MD  hydrALAZINE  (APRESOLINE ) 100 MG tablet Take 1 tablet (100 mg total) by mouth 3 (three) times daily. 11/09/22  Yes Jacqueline Matsu, MD  isosorbide  mononitrate (IMDUR ) 30 MG 24 hr tablet TAKE 3 TABLETS(90 MG) BY MOUTH DAILY 06/16/23  Yes Flo Hummingbird, [name]  nitroGLYCERIN  (NITROSTAT ) 0.4 MG SL tablet Place 1 tablet (0.4 mg total) under the tongue every 5 (five) minutes as needed for chest pain. 02/07/22  Yes [name], [name], [name]  rosuvastatin  (CRESTOR ) 20 MG tablet Take 1 tablet (20 mg total) by mouth daily. 05/02/23  Yes Cleven Dallas, DO  Spacer/Aero-Holding Chambers (PRO COMFORT SPACER ADULT) MISC Use spacer as needed with inhalers. 05/03/23  Yes Cleven Dallas, DO     Vitals:   08/15/23 0715 08/15/23 0800 08/15/23 1000 08/15/23 1130  BP: 130/69 (!) 160/77  127/62  Pulse: 63 71  64  Resp: 14 16  16   Temp:   98.9 F (37.2 C)   TempSrc:      SpO2: 99% 95%  94%  Weight:       Height:       Exam Gen alert, no distress, on RA No rash, cyanosis or gangrene Sclera anicteric, throat clear  No jvd or bruits Chest clear bilat to bases, no rales/ wheezing RRR no MRG Abd soft ntnd no mass or ascites +bs GU defer MS no joint effusions or deformity Ext no LE or UE edema, no other edema Neuro is alert, Ox 3 , nf    LUA AVF+bruit       Renal-related home meds: Rocaltrol 0.25 mcg po dd Norvasc  50 every day Coreg  25 bid Lasix  80 bid Hydralazine  100 tid Others: crestor , sl ntg, imdur , gabapentin , plavix , asa, albuterol     OP HD[facility]  4h  80.5kg  B400   AVF   Heparin  none  Last OP HD 6/02, post wt 80.3kg Usual UF 1.5- 3 kg, gets to dry wt regularly Last mircera 50  mcg on 5/02    Assessment/ Plan: R foot osteomyelitis: rigor's prior to presentation, WBC 16K here. Blood cx's pending. IV abx on hold. Podiatry consulting.  ESRD: on HD MWF. Last HD yesterday. Plan HD tomorrow.  HTN: BP wnl, cont home meds as needed, per pmd Volume: no sig vol excess on exam.  Anemia of esrd: Hb 9.5- 11 here, follow.  Secondary hyperparathyroidism: Ca in range, cont binders ac  Rob Zana Hesselbach  MD [facility] 08/15/2023, 1:21 PM  Recent Labs  Lab 08/14/23 1840 08/15/23 0552  HGB 10.7*  10.7* 9.7*  CALCIUM  8.8* 8.7*  CREATININE 4.13* 5.57*  K 3.6 3.7   Inpatient medications:  acetaminophen   1,000 mg Oral Q6H   amLODipine   5 mg Oral Daily   aspirin  EC  81 mg Oral Daily   carvedilol   25 mg Oral BID WC   furosemide   80 mg Oral BID   gabapentin   300 mg Oral QHS   heparin   5,000 Units Subcutaneous Q8H   hydrALAZINE   100 mg Oral Q8H   isosorbide  mononitrate  90 mg Oral Daily   [START ON 08/16/2023] rosuvastatin   10 mg Oral Daily    fentaNYL  (SUBLIMAZE ) injection

## 2023-08-15 NOTE — Progress Notes (Signed)
 Pharmacy Antibiotic Note  Samuel Gardner is a 53 y.o. male with medical history significant for PAD and ESRD on HD MWF who is admitted on 08/14/2023 with concerns for osteomyelitis. His dialysis team gave him vancomycin and cefepime before he left the dialysis center. Pharmacy has been consulted for vancomycin and cefepime dosing.   Vancomcyin random level 19 after receiving dose during HD 6/02, within target pre-dialysis goal range of 15-25 mcg/ml.   Plan: Vancomycin 750mg  IV qHD MWF  Cefepime 1g IV Q24h Trend WBC, fever, clinical progress,  F/u cultures, levels as indicated De-escalate when able   Height: 5\' 7"  (170.2 cm) Weight: 80.3 kg (177 lb) IBW/kg (Calculated) : 66.1  Temp (24hrs), Avg:99.4 F (37.4 C), Min:98.8 F (37.1 C), Max:101.5 F (38.6 C)  Recent Labs  Lab 08/14/23 1840 08/14/23 1845 08/15/23 0552  WBC 16.1*  16.6*  --  14.8*  CREATININE 4.13*  --  5.57*  LATICACIDVEN  --  1.0  --     Estimated Creatinine Clearance: 15.6 mL/min (A) (by C-G formula based on SCr of 5.57 mg/dL (H)).    Allergies  Allergen Reactions   Atorvastatin      Muscle cramping    Microbiology results: 6/02 BCx: pending  Thank you for allowing pharmacy to be a part of this patient's care.  Claudia Cuff, PharmD, BCPS Clinical Pharmacist

## 2023-08-15 NOTE — ED Notes (Signed)
 Pt to MRI

## 2023-08-15 NOTE — H&P (Addendum)
 Date: 08/15/2023         Patient Name:  Samuel Gardner MRN: [id number]  DOB: 1970-05-10 Age / Sex: 53 y.o., male   PCP: Cleven Dallas, DO         Medical Service: Internal Medicine Teaching Service         Attending Physician: Dr. Driscilla George, MD    First Contact: Dr. Diann Forth Pager: [phone]  Second Contact: Dr. Rozelle Corning Pager: [phone]                 Chief Concern: Shaking chills while at dialysis and right leg pain  History of Present Illness: Samuel Gardner is a 53 year old with peripheral arterial disease and end-stage renal disease who came to the emergency department after his dialysis session because he had shaking chills during his session.  His dialysis team gave him vancomycin and cefepime before he left the dialysis center and advised that he go to the emergency department.  He also has right leg pain.  This has been ongoing for a few weeks.  He saw me in the clinic for this problem around 1 week ago.  At that time I thought he had a cutaneous neuropathy.  He describes the pain as burning, it hurts to even brush when his hand against the skin over his shin.  The pain seems to radiate up from his foot to his leg.  He initially improved with gabapentin  but then his pain got worse over the weekend.  He has a chronic nonhealing ulcer on the medial part of his right foot over the first MTP joint that has been draining more of late and has developed malodor.  He has been receiving care for this wound at the wound center.  In the emergency department he was febrile to 101.5 F.  An x-ray of his right foot shows cortical destruction around the MTP joint suggestive of osteomyelitis.  He got a dose of Unasyn.  His history is notable for peripheral arterial disease.  He had angiogram with drug-coated balloon angioplasty of the right superficial femoral artery in March 2025 and an attempt to help the chronic right foot wound heal.  In February 2025 he had balloon lithotripsy to arteries  of the left tibioperoneal trunk.  He has ESRD and gets dialysis through left upper extremity AV fistula.  He reports a history of diabetes but he is not on treatment for this.  At any rate it is well-controlled at present.  Review of Systems  Constitutional:  Positive for malaise/fatigue.  HENT:  Negative for sore throat.   Respiratory:  Negative for cough and shortness of breath.   Gastrointestinal:  Negative for abdominal pain, diarrhea, nausea and vomiting.  Genitourinary:  Negative for dysuria and frequency.  Skin:  Negative for rash.   Allergies: Allergies  Allergen Reactions   Atorvastatin      Muscle cramping   Past Medical History: PAD, type 2 diabetes, hypertension, ESRD, heart failure with preserved ejection fraction, mitral regurgitation, anemia.  Medications: No current facility-administered medications on file prior to encounter.   Current Outpatient Medications on File Prior to Encounter  Medication Sig Dispense Refill   acetaminophen  (TYLENOL ) 500 MG tablet Take 1,000 mg by mouth every 6 (six) hours as needed for mild pain (pain score 1-3) or headache.     albuterol  (VENTOLIN  HFA) 108 (90 Base) MCG/ACT inhaler Inhale 2 puffs into the lungs every 6 (six) hours as needed for wheezing or shortness of breath. 6.7 g  2   amLODipine  (NORVASC ) 5 MG tablet Take 1 tablet (5 mg total) by mouth daily. 90 tablet 3   aspirin  EC 81 MG tablet Take 1 tablet (81 mg total) by mouth daily. Swallow whole. 90 tablet 0   carvedilol  (COREG ) 25 MG tablet TAKE 1 TABLET 2 TIMES DAILY 60 tablet 9   clopidogrel  (PLAVIX ) 75 MG tablet TAKE 1 TABLET(75 MG) BY MOUTH DAILY WITH BREAKFAST 90 tablet 1   furosemide  (LASIX ) 80 MG tablet TAKE 1 TABLET BY MOUTH TWICE DAILY 218 tablet 1   gabapentin  (NEURONTIN ) 300 MG capsule Take 1 capsule (300 mg total) by mouth at bedtime. 30 capsule 0   hydrALAZINE  (APRESOLINE ) 100 MG tablet Take 1 tablet (100 mg total) by mouth 3 (three) times daily. 270 tablet 3    isosorbide  mononitrate (IMDUR ) 30 MG 24 hr tablet TAKE 3 TABLETS(90 MG) BY MOUTH DAILY 270 tablet 3   nitroGLYCERIN  (NITROSTAT ) 0.4 MG SL tablet Place 1 tablet (0.4 mg total) under the tongue every 5 (five) minutes as needed for chest pain. 25 tablet 3   polyethylene glycol powder (GAVILAX) 17 GM/SCOOP powder Take 17 g by mouth daily as needed for mild constipation. 238 g 0   rosuvastatin  (CRESTOR ) 20 MG tablet Take 1 tablet (20 mg total) by mouth daily. 90 tablet 3   Spacer/Aero-Holding Chambers (PRO COMFORT SPACER ADULT) MISC Use spacer as needed with inhalers. 1 each 0    Surgical History: Past Surgical History:  Procedure Laterality Date   ABDOMINAL AORTOGRAM N/A 06/07/2023   Procedure: ABDOMINAL AORTOGRAM;  Surgeon: Kayla Part, MD;  Location: [facility] INVASIVE CV LAB;  Service: Cardiovascular;  Laterality: N/A;   ABDOMINAL AORTOGRAM W/LOWER EXTREMITY Bilateral 04/19/2023   Procedure: ABDOMINAL AORTOGRAM W/LOWER EXTREMITY;  Surgeon: Kayla Part, MD;  Location: [facility] INVASIVE CV LAB;  Service: Cardiovascular;  Laterality: Bilateral;   AV FISTULA PLACEMENT Left 12/13/2022   Procedure: LEFT ARM BRACHIOCEPHALIC ARTERIOVENOUS (AV) FISTULA CREATION;  Surgeon: Kayla Part, MD;  Location: [facility] OR;  Service: Vascular;  Laterality: Left;   COLONOSCOPY WITH PROPOFOL  N/A 04/11/2023   Procedure: COLONOSCOPY WITH PROPOFOL ;  Surgeon: Elois Hair, MD;  Location: [facility];  Service: Gastroenterology;  Laterality: N/A;   LEG SURGERY Right    teenager   LOWER EXTREMITY ANGIOGRAPHY N/A 06/07/2023   Procedure: Lower Extremity Angiography;  Surgeon: Kayla Part, MD;  Location: [facility] INVASIVE CV LAB;  Service: Cardiovascular;  Laterality: N/A;   LOWER EXTREMITY INTERVENTION  06/07/2023   Procedure: LOWER EXTREMITY INTERVENTION;  Surgeon: Kayla Part, MD;  Location: [facility] INVASIVE CV LAB;  Service: Cardiovascular;;   PERIPHERAL INTRAVASCULAR LITHOTRIPSY Left 04/19/2023   Procedure: PERIPHERAL  INTRAVASCULAR LITHOTRIPSY;  Surgeon: Kayla Part, MD;  Location: [facility] INVASIVE CV LAB;  Service: Cardiovascular;  Laterality: Left;   PERIPHERAL VASCULAR BALLOON ANGIOPLASTY Left 04/19/2023   Procedure: PERIPHERAL VASCULAR BALLOON ANGIOPLASTY;  Surgeon: Kayla Part, MD;  Location: [facility] INVASIVE CV LAB;  Service: Cardiovascular;  Laterality: Left;   POLYPECTOMY  04/11/2023   Procedure: POLYPECTOMY;  Surgeon: Elois Hair, MD;  Location: [facility];  Service: Gastroenterology;;   Family History:  Reviewed, no pertinent updates.  Social History:  Lives at home with his wife and 37 year old daughter.  He has good social support.  He enjoys a good functional level and independence but has been less functional of late due to his leg pain.  He no longer works, he is applying for disability.  He used to smoke  cigarettes but quit over a year ago, when he was smoking he smoked over a pack a day.  He does not currently drink or use drugs.  Physical Exam: Blood pressure 131/67, pulse 71, temperature 98.8 F (37.1 C), temperature source Oral, resp. rate 18, height 5\' 7"  (1.702 m), weight 80.3 kg, SpO2 98%.  No distress Moist, pink oral mucosa, no pharyngeal erythema or exudates Heart rate and rhythm is normal, there is a systolic murmur, peripheral pulses are not really palpable, an abdominal bruit is heard, left upper extremity fistula with a very strong thrill, capillary refill is normal on the left foot, delayed on the right foot Breathing comfortably on room air, anterior lung fields are clear Skin is warm and dry, left upper extremity fistula looks clean and normal There is a punched-out ulcer on the medial right foot over the first MTP joint that is draining serosanguineous fluid, there is no warmth or spreading erythema from this wound The right lower extremity is tender around the shin He is alert and oriented, speech is normal sounding, no facial asymmetry, moving all extremities  normally  Labs: Electrolytes okay EGFR of 16 Mild normocytic anemia Leukocytosis with left shift  Images and other studies: X-ray of the right foot shows cortical destruction around the first MTP joint  Assessment & Plan:  Samuel Gardner is a 53 y.o. with diabetes, peripheral arterial disease, and a chronic right foot ulcer who is admitted for osteomyelitis.  Principal Problem:   Osteomyelitis (HCC) He has very severe PAD.  Despite revascularization of the right leg in March this wound has not healed and now he has evidence of osteomyelitis on x-ray.  He has received doses of vancomycin, cefepime, and Unasyn thus far.  He does have signs of systemic inflammation including fever and leukocytosis with left shift but otherwise he is stable and not septic.  I have not ordered more antibiotics in case he has a bone culture.  If he has another fever I will restart antibiotics, I think vancomycin monotherapy will be okay in this person.  Considered other sources of systemic infection including vascular infection in this person with ESRD but I think osteomyelitis is the most likely cause of his fever.  Blood cultures are pending.  I recommend routine consultation to vascular surgery tomorrow, maybe podiatry.  Supportive care for now with scheduled Tylenol  and as needed fentanyl .  Active Problems:   PAD (peripheral artery disease) (HCC) Severe.  Follows with vascular surgery for this.  Has had interventions on right and left leg in the last 4 months.  Ordering ABI and arterial duplex ultrasound for right leg to assess perfusion, may help with surgery planning.  He has a well-healed wound on the left foot and the left foot seems better perfused than the right.    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  Completed a full dialysis session today.  Electrolytes are okay.  Volume status is fine.  Routine consultation to nephrology before next HD session on Wednesday. -  Continue home furosemide  80 mg twice daily    Essential hypertension Restarting home medicines: - Amlodipine  5 mg daily - Carvedilol  25 mg twice daily - Hydralazine  100 mg every 8 hours - Imdur  90 mg daily    Type 2 diabetes Well-controlled at present.  Not on medicine for this.  Monitor glucose via routine laboratory monitoring.    Normocytic anemia Multifactorial from inflammation due to CKD and osteomyelitis, and chronic drainage from  wound.    Right leg pain Seems like nerve type pain.  I do not think this is claudication or acute ischemia.  He does not have signs of cellulitis on exam.  He seemed to do okay with the gabapentin  that was prescribed in the clinic.  I will continue this at nighttime.  Continue scheduled Tylenol .  Fentanyl  25 mcg every 4 hours as needed.  Level of care: MedSurg Diet: N.p.o. in case of surgery IVF: N/A VTE: heparin  injection 5,000 Units Start: 08/15/23 0015 Code: Full Surrogate: Wife, Garlan Juniper  Signed: Adria Hopkins MD 08/15/2023, 1:11 AM Pager: [phone]

## 2023-08-15 NOTE — Consult Note (Signed)
 EDConsult    Reason for Consult: Osteomyelitis right great toe Referring Physician: Dr. [name] MRN #:  [id number]  History of Present Illness: This is a 53 y.o. male here with osteomyelitis of the right great toe.  He is followed by my partner Dr. Rosalva Comber who has performed bilateral lower extremity revascularization most recently in March to the right lower extremity SFA was balloon angioplastied.  Patient has known single-vessel runoff via the peroneal artery.  He was recently referred by wound care center to podiatry has not had that follow-up visit yet and now presents for worsening pain in the right foot with associated fevers.  Patient also has a wound on the left foot which is healing.  He is currently dialyzing via left upper arm AV fistula on Mondays, Wednesdays and Fridays.  Past Medical History:  Diagnosis Date   (HFpEF) heart failure with preserved ejection fraction (HCC)    Ascending aorta dilatation (HCC)    40mm by echo 11/2022   CKD (chronic kidney disease), stage IV (HCC)    Diabetes mellitus without complication (HCC)    Hypertension    Mitral regurgitation    mild by echo 11/2022   Normocytic anemia     Past Surgical History:  Procedure Laterality Date   ABDOMINAL AORTOGRAM N/A 06/07/2023   Procedure: ABDOMINAL AORTOGRAM;  Surgeon: Kayla Part, MD;  Location: [facility] INVASIVE CV LAB;  Service: Cardiovascular;  Laterality: N/A;   ABDOMINAL AORTOGRAM W/LOWER EXTREMITY Bilateral 04/19/2023   Procedure: ABDOMINAL AORTOGRAM W/LOWER EXTREMITY;  Surgeon: Kayla Part, MD;  Location: [facility] INVASIVE CV LAB;  Service: Cardiovascular;  Laterality: Bilateral;   AV FISTULA PLACEMENT Left 12/13/2022   Procedure: LEFT ARM BRACHIOCEPHALIC ARTERIOVENOUS (AV) FISTULA CREATION;  Surgeon: Kayla Part, MD;  Location:  [facility] OR;  Service: Vascular;  Laterality: Left;   COLONOSCOPY WITH PROPOFOL  N/A 04/11/2023   Procedure: COLONOSCOPY WITH PROPOFOL ;  Surgeon: Elois Hair, MD;  Location:  [facility] ENDOSCOPY;  Service: Gastroenterology;  Laterality: N/A;   LEG SURGERY Right    teenager   LOWER EXTREMITY ANGIOGRAPHY N/A 06/07/2023   Procedure: Lower Extremity Angiography;  Surgeon: Kayla Part, MD;  Location: [facility] INVASIVE CV LAB;  Service: Cardiovascular;  Laterality: N/A;   LOWER EXTREMITY INTERVENTION  06/07/2023   Procedure: LOWER EXTREMITY INTERVENTION;  Surgeon: Kayla Part, MD;  Location: [facility] INVASIVE CV LAB;  Service: Cardiovascular;;   PERIPHERAL INTRAVASCULAR LITHOTRIPSY Left 04/19/2023   Procedure: PERIPHERAL INTRAVASCULAR LITHOTRIPSY;  Surgeon: Kayla Part, MD;  Location: [facility] INVASIVE CV LAB;  Service: Cardiovascular;  Laterality: Left;   PERIPHERAL VASCULAR BALLOON ANGIOPLASTY Left 04/19/2023   Procedure: PERIPHERAL VASCULAR BALLOON ANGIOPLASTY;  Surgeon: Kayla Part, MD;  Location: [facility] INVASIVE CV LAB;  Service: Cardiovascular;  Laterality: Left;   POLYPECTOMY  04/11/2023   Procedure: POLYPECTOMY;  Surgeon: Elois Hair, MD;  Location: [facility];  Service: Gastroenterology;;    Allergies  Allergen Reactions   Atorvastatin      Muscle cramping    Prior to Admission medications   Medication Sig Start Date End Date Taking? Authorizing Provider  acetaminophen  (TYLENOL ) 500 MG tablet Take 1,000 mg by mouth every 6 (six) hours as needed for mild pain (pain score 1-3) or headache.   Yes [provider]  albuterol  (VENTOLIN  HFA) 108 (90 Base) MCG/ACT inhaler Inhale 2 puffs into the lungs every 6 (six) hours as needed for wheezing or shortness of breath. 02/02/22  Yes Osborn Blaze, MD  amLODipine  (NORVASC ) 5 MG tablet  Take 1 tablet (5 mg total) by mouth daily. 05/04/23  Yes Cleven Dallas, DO  aspirin  EC 81 MG tablet Take 1 tablet (81 mg total) by mouth daily. Swallow whole. 05/02/23  Yes Cleven Dallas, DO  calcitRIOL (ROCALTROL) 0.25 MCG capsule Take 0.25 mcg by mouth daily. 06/19/23  Yes [provider]  carvedilol  (COREG ) 25 MG tablet  TAKE 1 TABLET 2 TIMES DAILY 03/17/23  Yes [name], Rufus Council, MD  clopidogrel  (PLAVIX ) 75 MG tablet TAKE 1 TABLET(75 MG) BY MOUTH DAILY WITH BREAKFAST 08/10/23  Yes Cleven Dallas, DO  furosemide  (LASIX ) 80 MG tablet TAKE 1 TABLET BY MOUTH TWICE DAILY 07/10/23  Yes Cleven Dallas, DO  gabapentin  (NEURONTIN ) 300 MG capsule Take 1 capsule (300 mg total) by mouth at bedtime. 08/08/23 08/07/24 Yes Adria Hopkins, MD  hydrALAZINE  (APRESOLINE ) 100 MG tablet Take 1 tablet (100 mg total) by mouth 3 (three) times daily. 11/09/22  Yes Jacqueline Matsu, MD  isosorbide  mononitrate (IMDUR ) 30 MG 24 hr tablet TAKE 3 TABLETS(90 MG) BY MOUTH DAILY 06/16/23  Yes Flo Hummingbird, [name]  nitroGLYCERIN  (NITROSTAT ) 0.4 MG SL tablet Place 1 tablet (0.4 mg total) under the tongue every 5 (five) minutes as needed for chest pain. 02/07/22  Yes [name], [name], [name]  rosuvastatin  (CRESTOR ) 20 MG tablet Take 1 tablet (20 mg total) by mouth daily. 05/02/23  Yes Cleven Dallas, DO  Spacer/Aero-Holding Chambers (PRO COMFORT SPACER ADULT) MISC Use spacer as needed with inhalers. 05/03/23  Yes Cleven Dallas, DO    Social History   Socioeconomic History   Marital status: Married    Spouse name: Not on file   Number of children: Not on file   Years of education: Not on file   Highest education level: Not on file  Occupational History   Not on file  Tobacco Use   Smoking status: Former    Current packs/day: 0.00    Types: Cigarettes    Quit date: 01/30/2022    Years since quitting: 1.5   Smokeless tobacco: Never  Vaping Use   Vaping status: Never Used  Substance and Sexual Activity   Alcohol use: Not Currently   Drug use: No   Sexual activity: Not on file  Other Topics Concern   Not on file  Social History Narrative   Not on file   Social Drivers of Health   Financial Resource Strain: Not on file  Food Insecurity: No Food Insecurity (04/18/2023)   Hunger Vital Sign    Worried About Running Out of Food in the Last  Year: Never true    Ran Out of Food in the Last Year: Never true  Transportation Needs: No Transportation Needs (04/18/2023)   PRAPARE - Administrator, Civil Service (Medical): No    Lack of Transportation (Non-Medical): No  Physical Activity: Not on file  Stress: Not on file  Social Connections: Moderately Isolated (02/23/2022)   Social Connection and Isolation Panel [NHANES]    Frequency of Communication with Friends and Family: More than three times a week    Frequency of Social Gatherings with Friends and Family: More than three times a week    Attends Religious Services: Never    Database administrator or Organizations: No    Attends Banker Meetings: Never    Marital Status: Married  Catering manager Violence: Not At Risk (04/18/2023)   Humiliation, Afraid, Rape, and Kick questionnaire    Fear of Current or Ex-Partner: No  Emotionally Abused: No    Physically Abused: No    Sexually Abused: No     Family History  Adopted: Yes  Problem Relation Age of Onset   Diabetes Mother    Hypertension Mother    Kidney disease Mother    Cancer Father    Diabetes Sister    Hypertension Sister     Review of Systems  Constitutional:  Positive for fever.  HENT: Negative.    Eyes: Negative.   Respiratory: Negative.    Cardiovascular: Negative.   Gastrointestinal: Negative.   Genitourinary:        On hd  Skin: Negative.   Neurological: Negative.   Endo/Heme/Allergies: Negative.   Psychiatric/Behavioral: Negative.        Physical Examination  Vitals:   08/15/23 0715 08/15/23 0800  BP: 130/69 (!) 160/77  Pulse: 63 71  Resp: 14 16  Temp:    SpO2: 99% 95%   Body mass index is 27.72 kg/m.  Physical Exam HENT:     Head: Normocephalic.     Nose: Nose normal.  Eyes:     Pupils: Pupils are equal, round, and reactive to light.  Cardiovascular:     Rate and Rhythm: Normal rate.     Pulses:          Femoral pulses are 2+ on the right side and 2+  on the left side.      Popliteal pulses are 0 on the right side.     Comments: Strong signal detected in the right lower extremity as peroneal there are monophasic dorsalis pedis and posterior tibial signals on the right as well Pulmonary:     Effort: Pulmonary effort is normal.  Abdominal:     General: Abdomen is flat.  Musculoskeletal:     Comments: Strong thrill left upper arm  Neurological:     Mental Status: He is alert.      CBC    Component Value Date/Time   WBC 14.8 (H) 08/15/2023 0552   RBC 3.65 (L) 08/15/2023 0552   HGB 9.7 (L) 08/15/2023 0552   HGB 11.7 (L) 08/08/2023 1147   HCT 30.9 (L) 08/15/2023 0552   HCT 37.6 08/08/2023 1147   PLT 318 08/15/2023 0552   PLT 329 08/08/2023 1147   MCV 84.7 08/15/2023 0552   MCV 86 08/08/2023 1147   MCH 26.6 08/15/2023 0552   MCHC 31.4 08/15/2023 0552   RDW 17.9 (H) 08/15/2023 0552   RDW 16.2 (H) 08/08/2023 1147   LYMPHSABS 1.0 08/14/2023 1840   LYMPHSABS 1.4 08/08/2023 1147   MONOABS 1.9 (H) 08/14/2023 1840   EOSABS 0.1 08/14/2023 1840   EOSABS 0.2 08/08/2023 1147   BASOSABS 0.0 08/14/2023 1840   BASOSABS 0.1 08/08/2023 1147    BMET    Component Value Date/Time   NA 140 08/15/2023 0552   NA 142 12/05/2022 1633   K 3.7 08/15/2023 0552   CL 97 (L) 08/15/2023 0552   CO2 29 08/15/2023 0552   GLUCOSE 98 08/15/2023 0552   BUN 35 (H) 08/15/2023 0552   BUN 56 (H) 12/05/2022 1633   CREATININE 5.57 (H) 08/15/2023 0552   CALCIUM  8.7 (L) 08/15/2023 0552   GFRNONAA 11 (L) 08/15/2023 0552   GFRAA >60 07/24/2016 1115    COAGS: Lab Results  Component Value Date   INR 1.0 04/17/2023     Non-Invasive Vascular Imaging:   EXAM: RIGHT FOOT COMPLETE - 3+ VIEW   COMPARISON:  None Available.   FINDINGS: There are  focal lucencies and cortical erosions in the medial and lateral aspect of the first metatarsal head. There is overlying soft tissue swelling. No acute fracture or dislocation. Peripheral vascular calcifications  are present.   IMPRESSION: Focal lucencies and cortical erosions in the medial and lateral aspect of the first metatarsal head, concerning for osteomyelitis.     ASSESSMENT/PLAN: This is a 53 y.o. male status post bilateral lower extremity revascularization.  Plan is to recheck ABI today which should be okay for amputation per podiatry.  Will follow-up ABIs for further recommendations.  Redell Nazir C. Vikki Graves, MD [facility] Office: [phone] Pager: [phone]

## 2023-08-15 NOTE — Progress Notes (Signed)
 Subjective:  Patient Summary: Samuel Gardner is a 53 y.o. male with a pertinent past medical history of peripheral artery disease, ESRD (on MWF HD), hypertension, T2DM, and chronic R foot ulcer admitted for fever and R leg pain, now found to have acute osteomyelitis.   Overnight Events/Interim History: Patient reports that he is feeling well this morning. He continues to have burning pain to the top of his R foot extending up his shin and calf but does not extend past the knee. His pain is worsened with any movement or touch. Otherwise denies nausea, vomiting, abdominal pain, chest pain, or shortness of breath. No new concerns this morning.   Significant Hospital Events: N/A to date  Objective:  Vital signs in last 24 hours: Vitals:   08/15/23 0715 08/15/23 0800 08/15/23 1000 08/15/23 1130  BP: 130/69 (!) 160/77  127/62  Pulse: 63 71  64  Resp: 14 16  16   Temp:   98.9 F (37.2 C)   TempSrc:      SpO2: 99% 95%  94%  Weight:      Height:       Weight change:   Intake/Output Summary (Last 24 hours) at 08/15/2023 1332 Last data filed at 08/15/2023 0004 Gross per 24 hour  Intake 120 ml  Output --  Net 120 ml   Physical Exam -  General: Well-appearing, laying comfortably in NAD.  HEENT: Normocephalic, atraumatic. MMM.  CV: Regular rate, regular rhythm. No murmurs, rubs, or gallops. Normal S1/S2. LUE fistula site with palpable thrill. Delayed capillary refill of the R foot and PT/DP pulses present but difficult to palpate.  Pulm: Lungs clear to auscultation bilaterally. No increased WOB on RA.  MSK: Tenderness to light palpation of the top of the R foot, R calf, and R skin extending to knee. No associated erythema. Moving all other extremities equally.  Skin: Warm and dry. Punched-out ulcer to the medial R foot over the first MTP joint. Serosanguinous, malodorous drainage but no warmth or erythema.  Neuro: Alert and oriented. No gross neurologic deficits.    Assessment/Plan:  Principal Problem:   Osteomyelitis (HCC) Active Problems:   ESRD on dialysis (HCC)   Essential hypertension   Normocytic anemia   PAD (peripheral artery disease) (HCC)   Right leg pain   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)  Samuel Gardner is a 53 y.o. male with a pertinent past medical history of peripheral artery disease, ESRD (on MWF HD), hypertension, T2DM, and chronic R foot ulcer admitted for fever and R leg pain, now found to have acute osteomyelitis.   Acute osteomyelitis  Chronic R foot ulcer  XR R foot/ankle on admission demonstrated focal lucencies and cortical erosions in the medial and lateral aspect of the first metatarsal head, concerning for osteomyelitis. Initially febrile but since ED presentation he has remained afebrile and hemodynamically stable. Continued leukocytosis to 14.8, down trending from admission. Consulted Podiatry, who recommended MRI R foot w/o contrast. Awaiting vascular surgery recommendations re: surgical intervention. He is now s/p vancomycin, cefepime, and Unasyn administered yesterday. Holding off on administering additional antibiotics at this time pending surgery recommendations, however anticipate restarting later today.  - Podiatry and vascular surgery consulted, appreciate recommendations  - MRI R foot w/o contrast  - Follow-up blood cultures x 2, pending   Peripheral artery disease  Patient has a history of severe PAD s/p bilateral lower extremity revascularization. It is unclear whether is RLE pain is 2/2 PAD, osteomyelitis,  or some component of nerve pain. Will follow-up ABIs and await vascular surgery recommendations.  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 He remains euvolemic on physical examination. Last HD 6/2, will plan for next session tomorrow 6/4.  - Nephrology consulted, appreciate recommendations  - Continue home furosemide  80 mg BID   Normocytic anemia  Hgb further decreased to 9.7 this morning. Normocytic anemia with MCV 84. Suspect multifactorial given inflammatory state in the setting of CKD and osteomyelitis. Patient remains asymptomatic without dizziness, lightheadedness, or weakness. No clear source of bleeding beyond chronic drainage from wound.  - Continue to monitor Hgb with routine lab checks   Hypertension  Hyperlipidemia  Patient has remained normotensive since admission. Continuing home medications as below.  - Amlodipine  5 mg daily  - Carvedilol  25 mg BID - Hydralazine  100 mg q8h - Imdur  90 mg daily  - Rosuvastatin  10 mg daily (renal dosing)   T2DM Not currently on insulin  or other home medications. Blood glucoses appropriate at 90-120 thus far.  - Continue to monitor via routine lab monitoring   Diet: NPO, awaiting surgery recs  IVF: None VTE: Heparin  q8h Code: Full   LOS: 0 days   Leartis Proud, Medical Student 08/15/2023, 1:32 PM

## 2023-08-15 NOTE — Plan of Care (Signed)

## 2023-08-15 NOTE — Consult Note (Signed)
 PODIATRY CONSULTATION  NAME ALVON NYGAARD MRN [id number] DOB Mar 28, 1970 DOA 08/14/2023   Reason for consult:  R foot ulceration  Attending/Consulting physician: Bambi Bonine MD  History of present illness: "Vern Guerette is a 53 year old with peripheral arterial disease and end-stage renal disease who came to the emergency department after his dialysis session because he had shaking chills during his session.  His dialysis team gave him vancomycin and cefepime before he left the dialysis center and advised that he go to the emergency department.  He also has right leg pain.  This has been ongoing for a few weeks.  He saw me in the clinic for this problem around 1 week ago.  At that time I thought he had a cutaneous neuropathy.  He describes the pain as burning, it hurts to even brush when his hand against the skin over his shin.  The pain seems to radiate up from his foot to his leg.  He initially improved with gabapentin  but then his pain got worse over the weekend.  He has a chronic nonhealing ulcer on the medial part of his right foot over the first MTP joint that has been draining more of late and has developed malodor.  He has been receiving care for this wound at the wound center. "  Patient says the wound has been present since March.  He does report he has had vascular intervention on both legs including stents placed.  Reports the pain of the wound on the right foot brought him in as well as concern for worsening infection.  Was told by the dialysis center he needed to come in.  Past Medical History:  Diagnosis Date   (HFpEF) heart failure with preserved ejection fraction (HCC)    Ascending aorta dilatation (HCC)    40mm by echo 11/2022   CKD (chronic kidney disease), stage IV (HCC)    Diabetes mellitus without complication (HCC)    Hypertension    Mitral regurgitation    mild by echo 11/2022   Normocytic anemia        Latest Ref Rng & Units 08/15/2023    5:52 AM 08/14/2023     6:40 PM 08/08/2023   11:47 AM  CBC  WBC 4.0 - 10.5 K/uL 14.8  16.6    16.1  9.2   Hemoglobin 13.0 - 17.0 g/dL 9.7  84.1    32.4  40.1   Hematocrit 39.0 - 52.0 % 30.9  34.6    34.0  37.6   Platelets 150 - 400 K/uL 318  334    354  329        Latest Ref Rng & Units 08/15/2023    5:52 AM 08/14/2023    6:40 PM 06/07/2023    5:49 AM  BMP  Glucose 70 - 99 mg/dL 98  027  253   BUN 6 - 20 mg/dL 35  24  39   Creatinine 0.61 - 1.24 mg/dL 6.64  4.03  4.74   Sodium 135 - 145 mmol/L 140  137  139   Potassium 3.5 - 5.1 mmol/L 3.7  3.6  4.3   Chloride 98 - 111 mmol/L 97  95  104   CO2 22 - 32 mmol/L 29  26    Calcium  8.9 - 10.3 mg/dL 8.7  8.8        Physical Exam: Lower Extremity Exam Right foot quarter sized circular ulceration present at the  medial aspect of the first MPJ on the right  foot Mixed necrotic and fibrotic tissues present in the wound bed. Malodor is present.   Palpable DP and PT pulse right foot  Sensation intact to the digit level   ASSESSMENT/PLAN OF CARE 53 y.o. male with PMHx significant for  ESRD on MWF HD & PAD s/p bilateral LE revascularization  with right foot arterial ulceration concern for underlying osteomyelitis of the first MPJ.  WBC 14.8 XR right foot: Focal lucencies and erosions in the medial lateral aspect of the first metatarsal head concerning for osteomyelitis  MRI right foot: Pending  -Discussed with patient that if MRI concerns finding of osteomyelitis in the first metatarsal head would recommend partial first ray amputation.  This would happen as early as tomorrow.  N.p.o. past midnight.  He is agreeable to proceed should we need to if MRI confirms osteomyelitis.  -Patient does have palpable DP and PT pulses suspect he is optimized from a vascular standpoint following prior revascularization with stenting- appreciate vascular consultation, recheck ABI today and follow-up with vascular - Continue IV abx broad spectrum pending further culture data -  Anticoagulation: Okay to continue per vascular/primary recommendations - Wound care: Betadine gauze dressing applied to the wound preop - WB status: Weightbearing as tolerated in postop shoe - Will continue to follow   Thank you for the consult.  Please contact me directly with any questions or concerns.           [name], DPM [facility]    [address] [place], [place] [address]                Office [phone]  Fax [phone]

## 2023-08-15 NOTE — H&P (Incomplete)
 Date: 08/15/2023         Patient Name:  Samuel Gardner MRN: [id number]  DOB: May 23, 1970 Age / Sex: 53 y.o., male   PCP: Cleven Dallas, DO         Medical Service: Internal Medicine Teaching Service         Attending Physician: Dr. Driscilla George, MD    First Contact: Dr. Aaron Aas Pager: [phone]  Second Contact: Dr. Aaron Aas Pager: [phone]                 Chief Concern: Shaking chills and right foot pain  History of Present Illness: Samuel Gardner is a 53 y.o. male with pertinent PMH of ESRD on HD (left arm fistula established 11/2022, started HD 03/2023), PAD (s/p L balloon lithotripsy 04/2023, R balloon angioplasty 05/2023), HFpEF, and HTN who presents with shaking chills. History provided by patient and his wife at bedside. One week ago, patient was seen in clinic for right foot pain, which he described as nerve pain. A wound was noted on his right foot over the dorsal medial aspect of the MTP joint. The wound is being cared for by wound care doctors. He was initially treated with gabapentin  with some alleviation of pain. He reports that he spent most of the weekend in bed. He went to to dialysis today and experienced shaking chills during treatment, which he described as feeling freezing cold, like ice. This prompted administration of vancomycin and cefepime there. He finished dialysis, went home, and presented to the ED.   Spent most of the weekend in bed. Went to dialysis today, had shaking chills during treatment. Gave him some antibiotics during treatment, vancomycin and cefepime apparently. During shaking, "felt like I was freezing, cold, ice." Not having shaking chills now. He finished dialysis and went home before immediately going to the emergency department for shaking chills and right foot pain. ~1 week ago he was seen in the clinic for right foot pain. The pain was thought to be due to neuropathy. A wound was noted on his right foot over the dorsal medial aspect of the MTP joint.  The wound is being cared for by wound care doctors. He was treated with gabapentin  at that time with some improvement initially but the pain got worse. A couple of days ago he had some loose stool that resolved on its own. Has had decreased appetite. Urine is darker yellow over the last few days. Adhering to 32 oz fluid restriction. Hasn't noticed any discomfort or changes around LUE fistula. History notable for peripheral vascular disease. He had aortogram with intervention via drug coated balloon angioplasty of right superficial femoral artery in March 2025. In February 2025 had similar procedure with balloon lithotripsy to arteries of left tibioperoneal trunk. Of late he is less independent, which frustrates him. His mobility is decreased and some days he is confined to bed. He relies on his wife more these days. He lives at home with his wife and 25 year old daughter. He does not work at present. He is applying for disability. He has good social support.   ED Course: CBCd: WBC 16.6 with neutrophil predominance, Hgb 10.7, and Hct 34.0. BMP: BUN 24, Cr 4.13, Calcium  8.8 (corrects to 9.8 with most recent albumin of 2.8), GFR 16, anion gap 16 Lactic acid: 1.0.  Patient received vancomycin 1750 mg x 1, Unasyn 3 g x 1, Tylenol  1000 mg x 1, morphine 2 mg x 1.    Right  foot x-ray showed focal lucencies and cortical erosions in the medial and lateral aspect of the first metatarsal head, concerning for osteomyelitis.  ROS***  Allergies: Allergies  Allergen Reactions   Atorvastatin      Muscle cramping   ***  Past Medical History: Patient Active Problem List   Diagnosis Date Noted   Diabetic osteomyelitis (HCC) 08/15/2023   Right leg pain 08/08/2023   Nonhealing ulcer of left lower extremity (HCC) 04/26/2023   PAD (peripheral artery disease) (HCC) 04/19/2023   Ulcer of foot (HCC) 04/18/2023   Screening for colon cancer 04/11/2023   Benign neoplasm of ascending colon 04/11/2023   Chronic kidney  disease (CKD), stage IV (severe) (HCC) 04/11/2023   Ascending aorta dilatation (HCC)    Mitral regurgitation    Dyspnea 11/11/2022   Colon cancer screening declined 07/06/2022   Tobacco use 05/23/2022   Normocytic anemia 02/23/2022   ESRD (end stage renal disease) (HCC) 02/10/2022   (HFpEF) heart failure with preserved ejection fraction (HCC) 02/10/2022   Essential hypertension 02/10/2022   Hyperkalemia 02/09/2022   Past Medical History:  Diagnosis Date   (HFpEF) heart failure with preserved ejection fraction (HCC)    Ascending aorta dilatation (HCC)    40mm by echo 11/2022   CKD (chronic kidney disease), stage IV (HCC)    Diabetes mellitus without complication (HCC)    Hypertension    Mitral regurgitation    mild by echo 11/2022   Normocytic anemia    ***  Medications:  Current Facility-Administered Medications:    amLODipine  (NORVASC ) tablet 5 mg, 5 mg, Oral, Daily, McLendon, Michael, MD   aspirin  EC tablet 81 mg, 81 mg, Oral, Daily, McLendon, Michael, MD   carvedilol  (COREG ) tablet 25 mg, 25 mg, Oral, BID WC, McLendon, Michael, MD   furosemide  (LASIX ) tablet 80 mg, 80 mg, Oral, BID, McLendon, Michael, MD   gabapentin  (NEURONTIN ) capsule 300 mg, 300 mg, Oral, QHS, McLendon, Michael, MD   heparin  injection 5,000 Units, 5,000 Units, Subcutaneous, Q8H, Adria Hopkins, MD   hydrALAZINE  (APRESOLINE ) tablet 100 mg, 100 mg, Oral, Q8H, McLendon, Michael, MD   isosorbide  mononitrate (IMDUR ) 24 hr tablet 90 mg, 90 mg, Oral, Daily, McLendon, Michael, MD   rosuvastatin  (CRESTOR ) tablet 20 mg, 20 mg, Oral, Daily, McLendon, Michael, MD  Current Outpatient Medications:    acetaminophen  (TYLENOL ) 500 MG tablet, Take 1,000 mg by mouth every 6 (six) hours as needed for mild pain (pain score 1-3) or headache., Disp: , Rfl:    albuterol  (VENTOLIN  HFA) 108 (90 Base) MCG/ACT inhaler, Inhale 2 puffs into the lungs every 6 (six) hours as needed for wheezing or shortness of breath., Disp: 6.7 g,  Rfl: 2   amLODipine  (NORVASC ) 5 MG tablet, Take 1 tablet (5 mg total) by mouth daily., Disp: 90 tablet, Rfl: 3   aspirin  EC 81 MG tablet, Take 1 tablet (81 mg total) by mouth daily. Swallow whole., Disp: 90 tablet, Rfl: 0   carvedilol  (COREG ) 25 MG tablet, TAKE 1 TABLET 2 TIMES DAILY, Disp: 60 tablet, Rfl: 9   clopidogrel  (PLAVIX ) 75 MG tablet, TAKE 1 TABLET(75 MG) BY MOUTH DAILY WITH BREAKFAST, Disp: 90 tablet, Rfl: 1   furosemide  (LASIX ) 80 MG tablet, TAKE 1 TABLET BY MOUTH TWICE DAILY, Disp: 218 tablet, Rfl: 1   gabapentin  (NEURONTIN ) 300 MG capsule, Take 1 capsule (300 mg total) by mouth at bedtime., Disp: 30 capsule, Rfl: 0   hydrALAZINE  (APRESOLINE ) 100 MG tablet, Take 1 tablet (100 mg total) by mouth 3 (three)  times daily., Disp: 270 tablet, Rfl: 3   isosorbide  mononitrate (IMDUR ) 30 MG 24 hr tablet, TAKE 3 TABLETS(90 MG) BY MOUTH DAILY, Disp: 270 tablet, Rfl: 3   nitroGLYCERIN  (NITROSTAT ) 0.4 MG SL tablet, Place 1 tablet (0.4 mg total) under the tongue every 5 (five) minutes as needed for chest pain., Disp: 25 tablet, Rfl: 3   polyethylene glycol powder (GAVILAX) 17 GM/SCOOP powder, Take 17 g by mouth daily as needed for mild constipation., Disp: 238 g, Rfl: 0   rosuvastatin  (CRESTOR ) 20 MG tablet, Take 1 tablet (20 mg total) by mouth daily., Disp: 90 tablet, Rfl: 3   Spacer/Aero-Holding Chambers (PRO COMFORT SPACER ADULT) MISC, Use spacer as needed with inhalers., Disp: 1 each, Rfl: 0 ***  Surgical History: Past Surgical History:  Procedure Laterality Date   ABDOMINAL AORTOGRAM N/A 06/07/2023   Procedure: ABDOMINAL AORTOGRAM;  Surgeon: Kayla Part, MD;  Location: [facility] INVASIVE CV LAB;  Service: Cardiovascular;  Laterality: N/A;   ABDOMINAL AORTOGRAM W/LOWER EXTREMITY Bilateral 04/19/2023   Procedure: ABDOMINAL AORTOGRAM W/LOWER EXTREMITY;  Surgeon: Kayla Part, MD;  Location: [facility] INVASIVE CV LAB;  Service: Cardiovascular;  Laterality: Bilateral;   AV FISTULA PLACEMENT Left  12/13/2022   Procedure: LEFT ARM BRACHIOCEPHALIC ARTERIOVENOUS (AV) FISTULA CREATION;  Surgeon: Kayla Part, MD;  Location: [facility] OR;  Service: Vascular;  Laterality: Left;   COLONOSCOPY WITH PROPOFOL  N/A 04/11/2023   Procedure: COLONOSCOPY WITH PROPOFOL ;  Surgeon: Elois Hair, MD;  Location: [facility] ENDOSCOPY;  Service: Gastroenterology;  Laterality: N/A;   LEG SURGERY Right    teenager   LOWER EXTREMITY ANGIOGRAPHY N/A 06/07/2023   Procedure: Lower Extremity Angiography;  Surgeon: Kayla Part, MD;  Location: [facility] INVASIVE CV LAB;  Service: Cardiovascular;  Laterality: N/A;   LOWER EXTREMITY INTERVENTION  06/07/2023   Procedure: LOWER EXTREMITY INTERVENTION;  Surgeon: Kayla Part, MD;  Location: [facility] INVASIVE CV LAB;  Service: Cardiovascular;;   PERIPHERAL INTRAVASCULAR LITHOTRIPSY Left 04/19/2023   Procedure: PERIPHERAL INTRAVASCULAR LITHOTRIPSY;  Surgeon: Kayla Part, MD;  Location: [facility] INVASIVE CV LAB;  Service: Cardiovascular;  Laterality: Left;   PERIPHERAL VASCULAR BALLOON ANGIOPLASTY Left 04/19/2023   Procedure: PERIPHERAL VASCULAR BALLOON ANGIOPLASTY;  Surgeon: Kayla Part, MD;  Location: [facility] INVASIVE CV LAB;  Service: Cardiovascular;  Laterality: Left;   POLYPECTOMY  04/11/2023   Procedure: POLYPECTOMY;  Surgeon: Elois Hair, MD;  Location: [facility] ENDOSCOPY;  Service: Gastroenterology;;   ***  Family History:  Family History  Adopted: Yes  Problem Relation Age of Onset   Diabetes Mother    Hypertension Mother    Kidney disease Mother    Cancer Father    Diabetes Sister    Hypertension Sister    ***  Social History:  Social History   Socioeconomic History   Marital status: Married    Spouse name: Not on file   Number of children: Not on file   Years of education: Not on file   Highest education level: Not on file  Occupational History   Not on file  Tobacco Use   Smoking status: Former    Current packs/day: 0.00    Types: Cigarettes    Quit date:  01/30/2022    Years since quitting: 1.5   Smokeless tobacco: Never  Vaping Use   Vaping status: Never Used  Substance and Sexual Activity   Alcohol use: Not Currently   Drug use: No   Sexual activity: Not on file  Other Topics Concern   Not  on file  Social History Narrative   Not on file   Social Drivers of Health   Financial Resource Strain: Not on file  Food Insecurity: No Food Insecurity (04/18/2023)   Hunger Vital Sign    Worried About Running Out of Food in the Last Year: Never true    Ran Out of Food in the Last Year: Never true  Transportation Needs: No Transportation Needs (04/18/2023)   PRAPARE - Administrator, Civil Service (Medical): No    Lack of Transportation (Non-Medical): No  Physical Activity: Not on file  Stress: Not on file  Social Connections: Moderately Isolated (02/23/2022)   Social Connection and Isolation Panel [NHANES]    Frequency of Communication with Friends and Family: More than three times a week    Frequency of Social Gatherings with Friends and Family: More than three times a week    Attends Religious Services: Never    Database administrator or Organizations: No    Attends Banker Meetings: Never    Marital Status: Married  Catering manager Violence: Not At Risk (04/18/2023)   Humiliation, Afraid, Rape, and Kick questionnaire    Fear of Current or Ex-Partner: No    Emotionally Abused: No    Physically Abused: No    Sexually Abused: No   ***  Physical Exam: Blood pressure 131/67, pulse 71, temperature 98.8 F (37.1 C), temperature source Oral, resp. rate 18, height 5\' 7"  (1.702 m), weight 80.3 kg, SpO2 98%.  ***  EKG:  ***  Labs: ***  Images and other studies: ***  Assessment & Plan:  Samuel Gardner is a 53 y.o. ***  Principal Problem:   Diabetic osteomyelitis (HCC)  Osteomyelitis of the right foot Patient presents with foot pain and shaking chills with progression of deep weeping ulcer on right  foot. He was febrile at 2000 but otherwise HDS and found to have leukocytosis (16.6) with left shift. Other possible etiologies of infection include infection of his fistula, cellulitis, UTI/pyelo, and PNA; however, these are less likely given symptomatology and physical exam findings. - Routine consult to podiatry; appreciate their recommendations  - NPO at 0000 in anticipation of possible procedure - Vanc and and Unasyn discontinued for wound culture *** - Blood cultures pending - Fentanyl  25 mcg q4h PRN and Tylenol  1000 mg q6h PRN  Right leg pain Patient endorses right leg pain on the dorsum of foot, heel, and anterior shin to mid-shin. Of note, patient has history of injury with hardware placement; kindly see X-rays in imaging tab. No signs of cellulitis: no erythema, warm, swelling spreading from the wound proximally. - Analgesia as above - Monitor for fevers, HDS instability, signs of cellulitis,and clinical worsening  Chronic Conditions ESRD on HD Normocytic Anemia (10.7 baseline ~9) - Routine consult to nephrology; appreciate their recommendations - HD MWF - Fluid restriction 32 oz - Strict I/O - Daily weights  HTN - Continue ***  PAD s/p stenting/balloon lithotripsy In February and March of this year, patient underwent bilateral angioplasty.  - Routine consult to vascular surgery; appreciate their recommendations - ABI +/- TBI ordered - Arterial duplex with Doppler ordered - Holding clopidogrel  in anticipation of possible surgery - Continue ASA 81 mg daily  Level of care: Med-Surg Diet: NPO in anticipation of possible surgery IVF: None VTE: heparin  injection 5,000 Units Start: 08/15/23 0015 Code: Full Surrogate: wife Dina Francisco at bedside  Signed: Adria Hopkins MD 08/15/2023, 12:21 AM Pager: [phone]

## 2023-08-16 ENCOUNTER — Inpatient Hospital Stay (HOSPITAL_COMMUNITY)

## 2023-08-16 ENCOUNTER — Other Ambulatory Visit: Payer: Self-pay

## 2023-08-16 ENCOUNTER — Encounter (HOSPITAL_COMMUNITY): Payer: Self-pay | Admitting: Internal Medicine

## 2023-08-16 ENCOUNTER — Encounter (HOSPITAL_COMMUNITY)
Admission: EM | Disposition: A | Discharge status: Home / Self Care | Payer: Self-pay | Source: Ambulatory Visit | Attending: Internal Medicine

## 2023-08-16 DIAGNOSIS — M869 Osteomyelitis, unspecified: Secondary | ICD-10-CM

## 2023-08-16 DIAGNOSIS — I10 Essential (primary) hypertension: Secondary | ICD-10-CM | POA: Diagnosis not present

## 2023-08-16 DIAGNOSIS — E1169 Type 2 diabetes mellitus with other specified complication: Secondary | ICD-10-CM | POA: Diagnosis not present

## 2023-08-16 DIAGNOSIS — Z992 Dependence on renal dialysis: Secondary | ICD-10-CM | POA: Diagnosis not present

## 2023-08-16 DIAGNOSIS — N186 End stage renal disease: Secondary | ICD-10-CM | POA: Diagnosis not present

## 2023-08-16 DIAGNOSIS — Z87891 Personal history of nicotine dependence: Secondary | ICD-10-CM | POA: Diagnosis not present

## 2023-08-16 DIAGNOSIS — I7090 Unspecified atherosclerosis: Secondary | ICD-10-CM

## 2023-08-16 DIAGNOSIS — M86171 Other acute osteomyelitis, right ankle and foot: Secondary | ICD-10-CM | POA: Diagnosis not present

## 2023-08-16 DIAGNOSIS — E1122 Type 2 diabetes mellitus with diabetic chronic kidney disease: Secondary | ICD-10-CM | POA: Diagnosis not present

## 2023-08-16 HISTORY — PX: AMPUTATION: SHX166

## 2023-08-16 LAB — VAS US ABI WITH/WO TBI: Left ABI: 1.01

## 2023-08-16 LAB — RENAL FUNCTION PANEL
Albumin: 2.3 g/dL — ABNORMAL LOW (ref 3.5–5.0)
Anion gap: 16 — ABNORMAL HIGH (ref 5–15)
BUN: 53 mg/dL — ABNORMAL HIGH (ref 6–20)
CO2: 27 mmol/L (ref 22–32)
Calcium: 8.9 mg/dL (ref 8.9–10.3)
Chloride: 95 mmol/L — ABNORMAL LOW (ref 98–111)
Creatinine, Ser: 7.48 mg/dL — ABNORMAL HIGH (ref 0.61–1.24)
GFR, Estimated: 8 mL/min — ABNORMAL LOW (ref >60)
Glucose, Bld: 132 mg/dL — ABNORMAL HIGH (ref 70–99)
Phosphorus: 5 mg/dL — ABNORMAL HIGH (ref 2.5–4.6)
Potassium: 3.8 mmol/L (ref 3.5–5.1)
Sodium: 138 mmol/L (ref 135–145)

## 2023-08-16 LAB — CBC
HCT: 32.4 % — ABNORMAL LOW (ref 39.0–52.0)
Hemoglobin: 10.2 g/dL — ABNORMAL LOW (ref 13.0–17.0)
MCH: 26.8 pg (ref 26.0–34.0)
MCHC: 31.5 g/dL (ref 30.0–36.0)
MCV: 85 fL (ref 80.0–100.0)
Platelets: 327 10*3/uL (ref 150–400)
RBC: 3.81 MIL/uL — ABNORMAL LOW (ref 4.22–5.81)
RDW: 17.8 % — ABNORMAL HIGH (ref 11.5–15.5)
WBC: 14.5 10*3/uL — ABNORMAL HIGH (ref 4.0–10.5)
nRBC: 0 % (ref 0.0–0.2)

## 2023-08-16 LAB — GLUCOSE, CAPILLARY
Glucose-Capillary: 112 mg/dL — ABNORMAL HIGH (ref 70–99)
Glucose-Capillary: 110 mg/dL — ABNORMAL HIGH (ref 70–99)

## 2023-08-16 LAB — HEPATITIS B SURFACE ANTIBODY, QUANTITATIVE: Hep B S AB Quant (Post): <3.5 m[IU]/mL — ABNORMAL LOW

## 2023-08-16 SURGERY — AMPUTATION, FOOT, RAY
Anesthesia: Monitor Anesthesia Care | Site: Foot | Laterality: Right

## 2023-08-16 MED ORDER — TOBRAMYCIN SULFATE 80 MG/2ML IJ SOLN
INTRAMUSCULAR | Status: AC
  Filled 2023-08-16: qty 2

## 2023-08-16 MED ORDER — FENTANYL CITRATE (PF) 250 MCG/5ML IJ SOLN
INTRAMUSCULAR | Status: AC
  Filled 2023-08-16: qty 5

## 2023-08-16 MED ORDER — FENTANYL CITRATE (PF) 250 MCG/5ML IJ SOLN
INTRAMUSCULAR | Status: DC | PRN
  Administered 2023-08-16: 50 ug via INTRAVENOUS

## 2023-08-16 MED ORDER — INSULIN ASPART 100 UNIT/ML IJ SOLN: 0.0000 [IU] | INTRAMUSCULAR | Status: DC | PRN

## 2023-08-16 MED ORDER — HYDROMORPHONE HCL 2 MG PO TABS
1.0000 mg | ORAL_TABLET | ORAL | Status: DC | PRN
  Administered 2023-08-16 – 2023-08-17 (×3): 1 mg via ORAL
  Filled 2023-08-16 (×3): qty 1

## 2023-08-16 MED ORDER — CHLORHEXIDINE GLUCONATE 0.12 % MT SOLN
15.0000 mL | Freq: Once | OROMUCOSAL | Status: AC
  Administered 2023-08-16: 15 mL via OROMUCOSAL

## 2023-08-16 MED ORDER — MIDAZOLAM HCL 2 MG/2ML IJ SOLN
INTRAMUSCULAR | Status: AC
  Filled 2023-08-16: qty 2

## 2023-08-16 MED ORDER — ONDANSETRON HCL 4 MG/2ML IJ SOLN
INTRAMUSCULAR | Status: DC | PRN
  Administered 2023-08-16: 4 mg via INTRAVENOUS

## 2023-08-16 MED ORDER — DEXAMETHASONE SODIUM PHOSPHATE 10 MG/ML IJ SOLN
INTRAMUSCULAR | Status: AC
  Filled 2023-08-16: qty 1

## 2023-08-16 MED ORDER — PROPOFOL 10 MG/ML IV BOLUS
INTRAVENOUS | Status: DC | PRN
  Administered 2023-08-16: 75 ug/kg/min via INTRAVENOUS
  Administered 2023-08-16: 30 mg via INTRAVENOUS

## 2023-08-16 MED ORDER — EPHEDRINE 5 MG/ML INJ
INTRAVENOUS | Status: AC
  Filled 2023-08-16: qty 5

## 2023-08-16 MED ORDER — ONDANSETRON HCL 4 MG/2ML IJ SOLN
INTRAMUSCULAR | Status: AC
Start: 2023-08-16 — End: ?
  Filled 2023-08-16: qty 2

## 2023-08-16 MED ORDER — SODIUM CHLORIDE 0.9 % IR SOLN
Status: DC | PRN
  Administered 2023-08-16: 1000 mL

## 2023-08-16 MED ORDER — LIDOCAINE 2% (20 MG/ML) 5 ML SYRINGE
INTRAMUSCULAR | Status: AC
Start: 2023-08-16 — End: ?
  Filled 2023-08-16: qty 5

## 2023-08-16 MED ORDER — BUPIVACAINE HCL (PF) 0.5 % IJ SOLN
INTRAMUSCULAR | Status: AC
Start: 2023-08-16 — End: ?
  Filled 2023-08-16: qty 30

## 2023-08-16 MED ORDER — VANCOMYCIN HCL 500 MG IV SOLR
INTRAVENOUS | Status: DC | PRN
  Administered 2023-08-16: 500 mg

## 2023-08-16 MED ORDER — MIDAZOLAM HCL 2 MG/2ML IJ SOLN
INTRAMUSCULAR | Status: DC | PRN
  Administered 2023-08-16: 2 mg via INTRAVENOUS

## 2023-08-16 MED ORDER — VANCOMYCIN HCL 500 MG IV SOLR
INTRAVENOUS | Status: AC
  Filled 2023-08-16: qty 10

## 2023-08-16 MED ORDER — ORAL CARE MOUTH RINSE: 15.0000 mL | Freq: Once | OROMUCOSAL | Status: AC

## 2023-08-16 MED ORDER — PHENYLEPHRINE 80 MCG/ML (10ML) SYRINGE FOR IV PUSH (FOR BLOOD PRESSURE SUPPORT)
PREFILLED_SYRINGE | INTRAVENOUS | Status: AC
Start: 2023-08-16 — End: ?
  Filled 2023-08-16: qty 10

## 2023-08-16 MED ORDER — BUPIVACAINE HCL (PF) 0.5 % IJ SOLN
INTRAMUSCULAR | Status: DC | PRN
  Administered 2023-08-16: 10 mL

## 2023-08-16 MED ORDER — LACTATED RINGERS IV SOLN
INTRAVENOUS | Status: DC | PRN
Start: 2023-08-16 — End: 2023-08-16

## 2023-08-16 MED ORDER — LIDOCAINE HCL (PF) 1 % IJ SOLN
INTRAMUSCULAR | Status: DC | PRN
  Administered 2023-08-16: 10 mL

## 2023-08-16 MED ORDER — LIDOCAINE HCL (PF) 1 % IJ SOLN
INTRAMUSCULAR | Status: AC
  Filled 2023-08-16: qty 30

## 2023-08-16 MED ORDER — SODIUM CHLORIDE 0.9 % IV SOLN: INTRAVENOUS | Status: DC

## 2023-08-16 SURGICAL SUPPLY — 28 items
ALLOGRAFT AMNI BIOVANCE 5X5 1L (Graft) IMPLANT
BLADE AVERAGE 25X9 (BLADE) IMPLANT
BLADE SURG 10 STRL SS (BLADE) ×1 IMPLANT
BLADE SURG 15 STRL LF DISP TIS (BLADE) ×1 IMPLANT
BNDG ELASTIC 4INX 5YD STR LF (GAUZE/BANDAGES/DRESSINGS) IMPLANT
BNDG GAUZE DERMACEA FLUFF 4 (GAUZE/BANDAGES/DRESSINGS) IMPLANT
COVER SURGICAL LIGHT HANDLE (MISCELLANEOUS) IMPLANT
ELECTRODE REM PT RTRN 9FT ADLT (ELECTROSURGICAL) ×1 IMPLANT
GAUZE PAD ABD 8X10 STRL (GAUZE/BANDAGES/DRESSINGS) IMPLANT
GAUZE SPONGE 4X4 12PLY STRL (GAUZE/BANDAGES/DRESSINGS) ×1 IMPLANT
GAUZE STRETCH 2X75IN STRL (MISCELLANEOUS) ×1 IMPLANT
GAUZE XEROFORM 1X8 LF (GAUZE/BANDAGES/DRESSINGS) ×1 IMPLANT
GLOVE BIO SURGEON STRL SZ7.5 (GLOVE) ×1 IMPLANT
GLOVE BIOGEL PI IND STRL 7.5 (GLOVE) ×1 IMPLANT
GLOVE SURG SS PI 7.5 STRL IVOR (GLOVE) IMPLANT
GOWN STRL REUS W/ TWL LRG LVL3 (GOWN DISPOSABLE) ×2 IMPLANT
KIT BASIN OR (CUSTOM PROCEDURE TRAY) ×1 IMPLANT
NDL HYPO 25X1 1.5 SAFETY (NEEDLE) ×1 IMPLANT
NEEDLE HYPO 25X1 1.5 SAFETY (NEEDLE) ×1 IMPLANT
PACK ORTHO EXTREMITY (CUSTOM PROCEDURE TRAY) ×1 IMPLANT
SET HNDPC FAN SPRY TIP SCT (DISPOSABLE) IMPLANT
STOCKINETTE 4X48 STRL (DRAPES) IMPLANT
SUT PROLENE 3 0 PS 2 (SUTURE) IMPLANT
SYR CONTROL 10ML LL (SYRINGE) ×1 IMPLANT
TUBE CONNECTING 12X1/4 (SUCTIONS) IMPLANT
UNDERPAD 30X36 HEAVY ABSORB (UNDERPADS AND DIAPERS) ×1 IMPLANT
WATER STERILE IRR 1000ML POUR (IV SOLUTION) ×1 IMPLANT
YANKAUER SUCT BULB TIP NO VENT (SUCTIONS) IMPLANT

## 2023-08-16 NOTE — Progress Notes (Signed)
 History and Physical Interval Note:  08/16/2023 11:46 AM  Samuel Gardner  has presented today for surgery, with the diagnosis of Right foot osteomyelitis of 1st met head and proximal phalanx.  The various methods of treatment have been discussed with the patient and family. After consideration of risks, benefits and other options for treatment, the patient has consented to   Procedure(s) with comments: AMPUTATION, FOOT, RAY (Right) - Right partial first ray amputation as a surgical intervention.  The patient's history has been reviewed, patient examined, no change in status, stable for surgery.  I have reviewed the patient's chart and labs.  Questions were answered to the patient's satisfaction.     Karlene Overcast Shauna Bodkins

## 2023-08-16 NOTE — Op Note (Signed)
 Full Operative Report  Date of Operation: 1:34 PM, 08/16/2023   Patient: Samuel Gardner - 53 y.o. male  Surgeon: Evertt Hoe, DPM   Assistant: None  Diagnosis: Osteomyelitis right 1st Metatarsal Phalanx Joint  Procedure:  1.  Partial first ray amputation, right foot 2.  Application of amniotic graft 5 x 5 cm, right foot    Anesthesia: Monitor Anesthesia Care  No responsible provider has been recorded for the case.  Anesthesiologist: Willian Harrow, MD CRNA: Rochelle Chu, CRNA Student Nurse Anesthetist: Larena Plaza, RN   Estimated Blood Loss: 10 mL  Hemostasis: 1) Anatomical dissection, mechanical compression, electrocautery 2) no tourniquet was used during procedure  Implants: Implant Name Type Inv. Item Serial No. Manufacturer Lot No. LRB No. Used Action  ALLOGRAFT AMNI BIOVANCE 5X5 1L - [id number] BA Graft ALLOGRAFT AMNI BIOVANCE 5X5 1L [id number] BA [employer]  Right 1 Implanted    Materials: Prolene 3-0 and skin staples  Injectables: 1) Pre-operatively: 20 cc of 50:50 mixture 1%lidocaine  plain and 0.5% marcaine plain 2) Post-operatively: None   Specimens: - Pathology: Right first ray for pathology and separately proximal margin first metatarsal for path - Microbiology: Bone culture first metatarsal head for micro   Antibiotics: IV antibiotics given per schedule on the floor  Drains: None  Complications: Patient tolerated the procedure well without complication.   Operative findings: As below in detailed report  Indications for Procedure: [name] presents to [facility], [place] with a chief complaint of chronic ulceration dorsal medial aspect of first MPJ with concern for underlying osteomyelitis on MRI.  The patient has failed conservative treatments of various modalities. At this time the patient has elected to proceed with surgical correction. All alternatives, risks, and complications of the procedures were  thoroughly explained to the patient. Patient exhibits appropriate understanding of all discussion points and informed consent was signed and obtained in the chart with no guarantees to surgical outcome given or implied.  Description of Procedure: Patient was brought to the operating room. Patient remained on their hospital bed in the supine position. A surgical timeout was performed and all members of the operating room, the procedure, and the surgical site were identified. anesthesia occurred as per anesthesia record. Local anesthetic as previously described was then injected about the operative field in a local infiltrative block.  The operative lower extremity as noted above was then prepped and draped in the usual sterile manner. The following procedure then began.  Attention was directed to the RIGHT lower extremity. A racquet type incision was made over the dorsal medial aspect of the 1st metatarsal, including the entire digit. A full thickness incision was made down to bone using a #10 blade. The incision was continued through the soft tissue down to the shaft of the 1st metatarsal. At this time, 3 cc of purulent drainage was noted near the metatarsal head. A deep bone culture was taken from the first metatarsal head with rongeur at this time and passed off the field. Using a #15 blade, the digit was then disarticulated in its entirety at the metatarsophalangeal joint and freed of all soft tissue attachments. The specimen was passed off the field and sent for gross pathology. Next, a 15 blade was then used to free up the periosteum on the metatarsal shaft. Using a sagittal saw, the metatarsal was cut in a dorsal distal to plantar proximal orientation and beveled so that the medial cortex was shorter than the lateral. The bone was passed  off the field and sent as a gross specimen with the right great toe.  The sesamoids were also resected and sent with the above pathology.  Next a second cylindrical piece  of first metatarsal bone was resected with sagittal saw this was inked approximately to indicate the true proximal margin.  This was sent separately for pathology.  All remaining non-viable and necrotic tissues were sharply resected and removed. Extensor and flexors tendons were grasped with a hemostat and cut proximally. The surgical site was then flushed with 3000ml of saline with pulse lavage.    Next 500 mg of vancomycin powder were placed within the surgical cavity.  Then in order to promote healing given soft tissue and vascular compromise decision is made to apply amniotic graft.  A 5 x 5 piece of single-layer bioVance Arthrex amniotic graft was implanted within the surgical cavity.The skin flap was then approximated and partially closed proximally under minimal tension with combination of 3-0 Prolene and skin staples.   The surgical site was then dressed with Xeroform 4 x 4 ABD pad Kerlix Ace. The patient tolerated both the procedure and anesthesia well with vital signs stable throughout. The patient was transferred in good condition and all vital signs stable  from the OR to recovery under the discretion of anesthesia.  Condition: Vital signs stable, neurovascular status unchanged from preoperative   Surgical plan:  Expect length surgical margin though wait for pathology and culture result to determine final length and route of antibiotic therapy.  Nonweightbearing in postop shoe.  Decent bleeding noted intraoperatively relatively good healing potential.  The patient will be nonweightbearing in a postop shoe to the operative limb until further instructed. The dressing is to remain clean, dry, and intact. Will continue to follow unless noted elsewhere.   Russ Course, DPM [facility]

## 2023-08-16 NOTE — Transfer of Care (Signed)
 Immediate Anesthesia Transfer of Care Note  Patient: Samuel Gardner  Procedure(s) Performed: AMPUTATION, FOOT, RAY (Right: Foot)  Patient Location: PACU  Anesthesia Type:MAC  Level of Consciousness: awake, alert , and oriented  Airway & Oxygen Therapy: Patient Spontanous Breathing  Post-op Assessment: Report given to RN and Post -op Vital signs reviewed and stable  Post vital signs: Reviewed and stable  Last Vitals:  Vitals Value Taken Time  BP 103/58 08/16/23 1316  Temp    Pulse 65 08/16/23 1319  Resp 21 08/16/23 1319  SpO2 92 % 08/16/23 1319  Vitals shown include unfiled device data.  Last Pain:  Vitals:   08/16/23 1042  TempSrc: Oral  PainSc:       Patients Stated Pain Goal: 2 (08/15/23 1821)  Complications: No notable events documented.

## 2023-08-16 NOTE — Progress Notes (Addendum)
 Subjective:  Patient Summary: Samuel Gardner is a 53 y.o. male with a pertinent past medical history of peripheral artery disease, ESRD (on MWF HD), hypertension, T2DM, and chronic R foot ulcer admitted for fever and R leg pain, now found to have acute osteomyelitis.   Overnight Events/Interim History: [name] present at bedside during rounds today. Patient reports that his R leg pain feels about the same as yesterday. States that he did not sleep well last night due to some leg/foot discomfort and anxiety surrounding his surgery today. Expresses concerns about daily life and getting to dialysis following his surgery. Otherwise, no new concerns this morning.   Significant Hospital Events:  - MRI R foot 6/3: Findings are consistent with osteomyelitis of the 1st metatarsal head and neck and base of the proximal phalanx with underlying soft tissue ulceration. Small complex effusion of the 1st metatarsophalangeal joint, suspicious for septic arthritis.  Objective:  Vital signs in last 24 hours: Vitals:   08/15/23 2034 08/16/23 0447 08/16/23 0817 08/16/23 1042  BP: (!) 145/76 (!) 165/69 (!) 145/66 133/68  Pulse: 66 74 63 60  Resp: 18 19 16 18   Temp: 98.4 F (36.9 C) 100 F (37.8 C) 98.1 F (36.7 C) 98 F (36.7 C)  TempSrc:    Oral  SpO2: 99% 100% 96% 97%  Weight:    79.4 kg  Height:    5\' 7"  (1.702 m)   Physical Exam -  General: Well-appearing, laying comfortably in NAD.  HEENT: Normocephalic, atraumatic. MMM.  CV: Regular rate, regular rhythm. No murmurs, rubs, or gallops. Normal S1/S2. LUE fistula site with palpable thrill. Delayed capillary refill of the R foot. R PT/DP pulses present but weaker than L.  Pulm: Lungs clear to auscultation bilaterally. No increased WOB on RA.  MSK: Tenderness to light palpation of the top of the R foot, R calf, and R skin extending to knee. No associated erythema. Moving all other extremities equally.  Skin: Warm and dry. Punched-out ulcer  to the medial R foot over the first MTP joint. Serosanguinous, malodorous drainage but no warmth or erythema.  Neuro: Alert and oriented. No gross neurologic deficits.   Assessment/Plan:  Principal Problem:   Osteomyelitis (HCC) Active Problems:   ESRD on dialysis (HCC)   Essential hypertension   Normocytic anemia   PAD (peripheral artery disease) (HCC)   Right leg pain   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)  Samuel Gardner is a 53 y.o. male with a pertinent past medical history of peripheral artery disease, ESRD (on MWF HD), hypertension, T2DM, and chronic R foot ulcer admitted for fever and R leg pain, now found to have acute osteomyelitis.   Acute osteomyelitis  Chronic R foot ulcer   MRI R foot obtained yesterday demonstrated osteomyelitis of the 1st metatarsal head/neck and base of proximal phalanx which underlying soft tissue ulceration, consistent with prior clinical assessment. He has remained afebrile and hemodynamically stable since admission. Leukocytosis stable and somewhat decreased with WBC 14.5. Blood cultures with no growth at 24 hours thus far. Podiatry and vascular surgery were consulted yesterday, and he is now NPO for partial amputation of the first ray today. He has remained on vancomycin and cefepime with Pharmacy assistance. Most recent vanc trough was 19 at 1000 yesterday. Will follow-up op note and surgery recommendations to determine final post-op antibiotic plan.  - Podiatry and vascular surgery consulted, appreciate recommendations   - Partial first ray amputation today  -  Continue vancomycin and cefepime per Pharmacy  - Continue to follow-up blood cultures x 2  Peripheral artery disease  Patient has a history of severe PAD s/p bilateral lower extremity revascularization. It is unclear whether is RLE pain is 2/2 PAD, osteomyelitis, or some component of nerve pain, however likely multifactorial. ABIs and arterial duplex US  were  ordered yesterday - these have not been completed but priority remains surgery today. Most recent ABIs were performed < 1 month ago. If still recommended by vascular post surgery, will follow up new ABIs.  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 He remains euvolemic on physical examination. Plan for HD today.  - Nephrology consulted, appreciate recommendations  - HD today  - Continue home furosemide  80 mg BID   Normocytic anemia  Hgb improved to 10.2 this morning. Suspect multifactorial normocytic anemia given inflammatory state in the setting of CKD and osteomyelitis. Patient remains asymptomatic. No clear source of bleeding beyond chronic drainage from wound.  - Continue to monitor Hgb with routine lab checks   Hypertension  Hyperlipidemia  Continuing home medications as below.  - Amlodipine  5 mg daily  - Carvedilol  25 mg BID - Hydralazine  100 mg q8h - Imdur  90 mg daily  - Rosuvastatin  10 mg daily (renal dosing)   T2DM Not currently on insulin  or other home medications. Blood glucoses appropriate thus far.  - Continue to monitor via routine lab monitoring   Diet: NPO for surgery today  IVF: 10 mL/hr NS pre-op  VTE: Heparin  q8h Code: Full   LOS: 1 day   Leartis Proud, Medical Student 08/16/2023, 11:23 AM

## 2023-08-16 NOTE — Anesthesia Preprocedure Evaluation (Signed)
 Anesthesia Evaluation  Patient identified by MRN, date of birth, ID band Patient awake    Reviewed: Allergy & Precautions, NPO status , Patient's Chart, lab work & pertinent test results  Airway Mallampati: I  TM Distance: >3 FB Neck ROM: Full    Dental  (+) Missing, Dental Advisory Given,    Pulmonary former smoker   breath sounds clear to auscultation       Cardiovascular hypertension, Pt. on medications and Pt. on home beta blockers + Peripheral Vascular Disease  + Valvular Problems/Murmurs MR  Rhythm:Regular Rate:Normal  Echo:  1. Left ventricular ejection fraction, by estimation, is 60 to 65%. The  left ventricle has normal function. The left ventricle has no regional  wall motion abnormalities. There is moderate left ventricular hypertrophy.  Left ventricular diastolic  parameters are consistent with Grade I diastolic dysfunction (impaired  relaxation).   2. Right ventricular systolic function is normal. The right ventricular  size is normal.   3. Left atrial size was moderately dilated.   4. Right atrial size was mildly dilated.   5. The mitral valve is degenerative. Mild mitral valve regurgitation. No  evidence of mitral stenosis.   6. The aortic valve is tricuspid. There is mild calcification of the  aortic valve. Aortic valve regurgitation is not visualized. Aortic valve  sclerosis/calcification is present, without any evidence of aortic  stenosis.   7. Aortic dilatation noted. There is mild dilatation of the ascending  aorta, measuring 40 mm.   8. The inferior vena cava is normal in size with greater than 50%  respiratory variability, suggesting right atrial pressure of 3 mmHg.     Neuro/Psych negative neurological ROS  negative psych ROS   GI/Hepatic negative GI ROS, Neg liver ROS,,,  Endo/Other  diabetes    Renal/GU Renal disease     Musculoskeletal negative musculoskeletal ROS (+)    Abdominal    Peds  Hematology  (+) Blood dyscrasia, anemia   Anesthesia Other Findings   Reproductive/Obstetrics                             Anesthesia Physical Anesthesia Plan  ASA: 3  Anesthesia Plan: MAC   Post-op Pain Management:    Induction: Intravenous  PONV Risk Score and Plan: 2 and Ondansetron , Propofol  infusion and Midazolam   Airway Management Planned: Natural Airway and Simple Face Mask  Additional Equipment: None  Intra-op Plan:   Post-operative Plan:   Informed Consent: I have reviewed the patients History and Physical, chart, labs and discussed the procedure including the risks, benefits and alternatives for the proposed anesthesia with the patient or authorized representative who has indicated his/her understanding and acceptance.     Dental advisory given  Plan Discussed with: CRNA  Anesthesia Plan Comments:        Anesthesia Quick Evaluation

## 2023-08-16 NOTE — Progress Notes (Signed)
 [facility] Progress Note   Subjective:   Report toe is hurting, no other complaints. Denies SOB, CP, dizziness, nausea.   Objective Vitals:   08/15/23 1448 08/15/23 2034 08/16/23 0447 08/16/23 0817  BP: (!) 150/62 (!) 145/76 (!) 165/69 (!) 145/66  Pulse: 65 66 74 63  Resp: 16 18 19 16   Temp: 98.9 F (37.2 C) 98.4 F (36.9 C) 100 F (37.8 C) 98.1 F (36.7 C)  TempSrc:      SpO2: 97% 99% 100% 96%  Weight:      Height:       Physical Exam General: Alert male in NAD Heart: RRR, no murmurs Lungs: CTA bilaterally, respirations unlabored on RA Abdomen: Soft, non-distended, +BS Extremities: No edema, foot wrapped Dialysis Access:  LUE AVF + t/b  Additional Objective Labs: Basic Metabolic Panel: Recent Labs  Lab 08/14/23 1840 08/15/23 0552 08/16/23 0605  NA 137 140 138  K 3.6 3.7 3.8  CL 95* 97* 95*  CO2 26 29 27   GLUCOSE 128* 98 132*  BUN 24* 35* 53*  CREATININE 4.13* 5.57* 7.48*  CALCIUM  8.8* 8.7* 8.9  PHOS  --   --  5.0*   Liver Function Tests: Recent Labs  Lab 08/16/23 0605  ALBUMIN 2.3*   No results for input(s): "LIPASE", "AMYLASE" in the last 168 hours. CBC: Recent Labs  Lab 08/14/23 1840 08/15/23 0552 08/16/23 0605  WBC 16.1*  16.6* 14.8* 14.5*  NEUTROABS 13.0*  --   --   HGB 10.7*  10.7* 9.7* 10.2*  HCT 34.0*  34.6* 30.9* 32.4*  MCV 86.3  86.3 84.7 85.0  PLT 354  334 318 327   Blood Culture    Component Value Date/Time   SDES BLOOD RIGHT FOREARM 08/14/2023 2156   SPECREQUEST  08/14/2023 2156    BOTTLES DRAWN AEROBIC AND ANAEROBIC Blood Culture adequate volume   CULT  08/14/2023 2156    NO GROWTH < 12 HOURS Performed at [facility], [address] [place], [place] [address]    REPTSTATUS PENDING 08/14/2023 2156    Cardiac Enzymes: No results for input(s): "CKTOTAL", "CKMB", "CKMBINDEX", "TROPONINI" in the last 168 hours. CBG: No results for input(s): "GLUCAP" in the last 168 hours. Iron Studies: No  results for input(s): "IRON", "TIBC", "TRANSFERRIN", "FERRITIN" in the last 72 hours. @lablastinr3 @ Studies/Results: MR FOOT RIGHT WO CONTRAST Result Date: 08/15/2023 CLINICAL DATA:  Forefoot wound.  Concern for osteomyelitis. EXAM: MRI OF THE RIGHT FOREFOOT WITHOUT CONTRAST TECHNIQUE: Multiplanar, multisequence MR imaging of the right forefoot was performed. No intravenous contrast was administered. COMPARISON:  Radiographs 08/14/2023 FINDINGS: Bones/Joint/Cartilage Apparent soft tissue wound along the dorsal and medial aspect of the 1st metatarsophalangeal joint. As seen on recent radiographs, there is cortical destruction of the 1st metatarsal head consistent with osteomyelitis. There is corresponding decreased T1 marrow signal throughout the 1st metatarsal head and neck. There is marrow T2 hyperintensity throughout the 1st metatarsal and proximal 1st phalanx with probable early cortical destruction involving the medial base of the proximal phalanx. There is a small complex effusion of the 1st metatarsophalangeal joint. There is also mild nonspecific marrow T2 hyperintensity within the 2nd metatarsal head and medial base of the proximal 2nd phalanx without T1 signal abnormality or cortical destruction. The additional metatarsals and toes appear unremarkable. Probable reactive or neurogenic marrow changes in the cuboid and lateral cuneiform. No other significant joint effusions. Ligaments Intact Lisfranc ligament. The collateral ligaments of the metatarsophalangeal joints appear intact. Muscles and Tendons Nonspecific  generalized forefoot muscular edema. There is a small amount of fluid within the flexor digitorum tendon sheath. No evidence of tendon tear. Soft tissues As above, apparent soft tissue ulceration along the dorsal and medial aspect of the 1st metatarsal-phalangeal joint with an underlying joint effusion and evidence of osteomyelitis as described above. No organized fluid collection identified.  There is mild dorsal subcutaneous edema in the lateral aspect of the forefoot. IMPRESSION: 1. Findings are consistent with osteomyelitis of the 1st metatarsal head and neck and base of the proximal phalanx with underlying soft tissue ulceration along the dorsal and medial aspect of the 1st metatarsophalangeal joint. Marrow infection may extend into the 1st metatarsal shaft and proximal 1st phalanx. 2. Small complex effusion of the 1st metatarsophalangeal joint, suspicious for septic arthritis. 3. Mild nonspecific marrow T2 hyperintensity within the 2nd metatarsal head and medial base of the proximal 2nd phalanx, possibly reactive. 4. Nonspecific generalized forefoot muscular edema. No organized fluid collection identified. Electronically Signed   By: Elmon Hagedorn M.D.   On: 08/15/2023 17:24   DG Ankle Complete Right Result Date: 08/14/2023 CLINICAL DATA:  Wound on the foot. EXAM: RIGHT ANKLE - COMPLETE 3+ VIEW COMPARISON:  None Available. FINDINGS: There is no acute fracture or focal osseous lesion. There is no dislocation. Tibial intramedullary nail and distal screw appear uncomplicated. There is soft tissue swelling surrounding the ankle. Peripheral vascular calcifications are present. IMPRESSION: 1. No acute fracture or dislocation. 2. Soft tissue swelling surrounding the ankle. Electronically Signed   By: Tyron Gallon M.D.   On: 08/14/2023 19:52   DG Foot Complete Right Result Date: 08/14/2023 CLINICAL DATA:  Wound on the foot plate EXAM: RIGHT FOOT COMPLETE - 3+ VIEW COMPARISON:  None Available. FINDINGS: There are focal lucencies and cortical erosions in the medial and lateral aspect of the first metatarsal head. There is overlying soft tissue swelling. No acute fracture or dislocation. Peripheral vascular calcifications are present. IMPRESSION: Focal lucencies and cortical erosions in the medial and lateral aspect of the first metatarsal head, concerning for osteomyelitis. Electronically Signed   By:  Tyron Gallon M.D.   On: 08/14/2023 19:49   Medications:  ceFEPime (MAXIPIME) IV 1 g (08/15/23 1755)   vancomycin      acetaminophen   1,000 mg Oral Q6H   amLODipine   5 mg Oral Daily   aspirin  EC  81 mg Oral Daily   carvedilol   25 mg Oral BID WC   Chlorhexidine  Gluconate Cloth  6 each Topical Q0600   furosemide   80 mg Oral BID   gabapentin   300 mg Oral QHS   heparin   5,000 Units Subcutaneous Q8H   hydrALAZINE   100 mg Oral Q8H   isosorbide  mononitrate  90 mg Oral Daily   rosuvastatin   10 mg Oral Daily   vancomycin variable dose per unstable renal function (pharmacist dosing)   Does not apply See admin instructions    Dialysis Orders: [facility]  4h  80.5kg  B400   AVF   Heparin  none  Last OP HD 6/02, post wt 80.3kg Usual UF 1.5- 3 kg, gets to dry wt regularly Last mircera 50 mcg on 5/02    Assessment/Plan: R foot osteomyelitis: rigor's prior to presentation, WBC 16K here. Blood cx's pending. Podiatry consulting- pt reports plan for amputation today ESRD: on HD MWF. Last HD yesterday. Plan HD today HTN: BP slightly elevated, continue home medications Volume: no sig vol excess on exam.  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

## 2023-08-16 NOTE — Progress Notes (Signed)
 ABI's have been completed. Preliminary results can be found in CV Proc through chart review.   08/16/23 12:06 PM Birda Buffy RVT

## 2023-08-16 NOTE — Anesthesia Postprocedure Evaluation (Signed)
 Anesthesia Post Note  Patient: Samuel Gardner  Procedure(s) Performed: AMPUTATION, FOOT, RAY (Right: Foot)     Patient location during evaluation: PACU Anesthesia Type: MAC Level of consciousness: awake and alert Pain management: pain level controlled Vital Signs Assessment: post-procedure vital signs reviewed and stable Respiratory status: spontaneous breathing, nonlabored ventilation, respiratory function stable and patient connected to nasal cannula oxygen Cardiovascular status: stable and blood pressure returned to baseline Postop Assessment: no apparent nausea or vomiting Anesthetic complications: no  There were no known notable events for this encounter.  Last Vitals:  Vitals:   08/16/23 1430 08/16/23 1500  BP: (!) 102/53 133/60  Pulse: (!) 58 (!) 57  Resp: 11 12  Temp:    SpO2: 94% 92%    Last Pain:  Vitals:   08/16/23 1400  TempSrc:   PainSc: 0-No pain                 Willian Harrow

## 2023-08-16 NOTE — Progress Notes (Signed)
 Orthopedic Tech Progress Note Patient Details:  Samuel Gardner [birth year] [id number]  Ortho Devices Type of Ortho Device: Postop shoe/boot Ortho Device/Splint Location: RLE Ortho Device/Splint Interventions: Ordered   Post Interventions Patient Tolerated: Well Instructions Provided: Care of device  [name] L Emerson Barretto 08/16/2023, 9:06 PM

## 2023-08-16 NOTE — Progress Notes (Signed)
   08/16/23 1800  Vitals  Temp 98.4 F (36.9 C)  Pulse Rate (!) 52  Resp 13  BP (!) 102/55  SpO2 93 %  Weight 80.3 kg  Type of Weight Post-Dialysis  Post Treatment  Dialyzer Clearance Lightly streaked  Hemodialysis Intake (mL) 0 mL  Liters Processed 63.3  Fluid Removed (mL) 2200 mL  Tolerated HD Treatment Yes  AVG/AVF Arterial Site Held (minutes) 6 minutes  AVG/AVF Venous Site Held (minutes) 6 minutes   Received patient in bed to unit.  Alert and oriented.  Informed consent signed and in chart.   TX duration:3.5HRS  Patient tolerated well.  Transported back to the room  Alert, without acute distress.  Hand-off given to patient's nurse.   Access used: LAVF Access issues: NONE  Total UF removed: 2.2L Medication(s) given: NONE    Samuel Gardner [facility]

## 2023-08-17 ENCOUNTER — Encounter (HOSPITAL_COMMUNITY): Payer: Self-pay | Admitting: Podiatry

## 2023-08-17 ENCOUNTER — Other Ambulatory Visit (HOSPITAL_COMMUNITY): Payer: Self-pay

## 2023-08-17 ENCOUNTER — Encounter (HOSPITAL_COMMUNITY): Payer: Self-pay

## 2023-08-17 DIAGNOSIS — M86171 Other acute osteomyelitis, right ankle and foot: Secondary | ICD-10-CM | POA: Diagnosis not present

## 2023-08-17 DIAGNOSIS — E1122 Type 2 diabetes mellitus with diabetic chronic kidney disease: Secondary | ICD-10-CM | POA: Diagnosis not present

## 2023-08-17 DIAGNOSIS — N186 End stage renal disease: Secondary | ICD-10-CM | POA: Diagnosis not present

## 2023-08-17 DIAGNOSIS — Z992 Dependence on renal dialysis: Secondary | ICD-10-CM | POA: Diagnosis not present

## 2023-08-17 LAB — CBC
HCT: 32.7 % — ABNORMAL LOW (ref 39.0–52.0)
Hemoglobin: 10.2 g/dL — ABNORMAL LOW (ref 13.0–17.0)
MCH: 27 pg (ref 26.0–34.0)
MCHC: 31.2 g/dL (ref 30.0–36.0)
MCV: 86.5 fL (ref 80.0–100.0)
Platelets: 368 10*3/uL (ref 150–400)
RBC: 3.78 MIL/uL — ABNORMAL LOW (ref 4.22–5.81)
RDW: 17.6 % — ABNORMAL HIGH (ref 11.5–15.5)
WBC: 13.4 10*3/uL — ABNORMAL HIGH (ref 4.0–10.5)
nRBC: 0 % (ref 0.0–0.2)

## 2023-08-17 LAB — RENAL FUNCTION PANEL
Albumin: 2.4 g/dL — ABNORMAL LOW (ref 3.5–5.0)
Anion gap: 13 (ref 5–15)
BUN: 45 mg/dL — ABNORMAL HIGH (ref 6–20)
CO2: 28 mmol/L (ref 22–32)
Calcium: 8.3 mg/dL — ABNORMAL LOW (ref 8.9–10.3)
Chloride: 94 mmol/L — ABNORMAL LOW (ref 98–111)
Creatinine, Ser: 6.57 mg/dL — ABNORMAL HIGH (ref 0.61–1.24)
GFR, Estimated: 9 mL/min — ABNORMAL LOW (ref >60)
Glucose, Bld: 127 mg/dL — ABNORMAL HIGH (ref 70–99)
Phosphorus: 5.4 mg/dL — ABNORMAL HIGH (ref 2.5–4.6)
Potassium: 3.9 mmol/L (ref 3.5–5.1)
Sodium: 135 mmol/L (ref 135–145)

## 2023-08-17 MED ORDER — CHLORHEXIDINE GLUCONATE CLOTH 2 % EX PADS: 6.0000 | MEDICATED_PAD | Freq: Every day | CUTANEOUS | Status: DC

## 2023-08-17 MED ORDER — ROSUVASTATIN CALCIUM 10 MG PO TABS
10.0000 mg | ORAL_TABLET | Freq: Every day | ORAL | 3 refills | Status: DC
  Filled 2023-08-17: qty 30, 30d supply, fill #0

## 2023-08-17 MED ORDER — AMOXICILLIN-POT CLAVULANATE 875-125 MG PO TABS
1.0000 | ORAL_TABLET | Freq: Two times a day (BID) | ORAL | 0 refills | Status: DC
  Filled 2023-08-17: qty 56, 28d supply, fill #0

## 2023-08-17 MED ORDER — SENNOSIDES-DOCUSATE SODIUM 8.6-50 MG PO TABS: 1.0000 | ORAL_TABLET | Freq: Every day | ORAL | Status: DC

## 2023-08-17 MED ORDER — VANCOMYCIN HCL IN DEXTROSE 1-5 GM/200ML-% IV SOLN: 1000.0000 mg | Freq: Once | INTRAVENOUS | Status: DC

## 2023-08-17 MED ORDER — AMOXICILLIN-POT CLAVULANATE 500-125 MG PO TABS
1.0000 | ORAL_TABLET | Freq: Every day | ORAL | 0 refills | Status: AC
  Filled 2023-08-17: qty 27, 27d supply, fill #0

## 2023-08-17 MED ORDER — VANCOMYCIN HCL 750 MG/150ML IV SOLN
750.0000 mg | INTRAVENOUS | Status: AC
  Administered 2023-08-17: 750 mg via INTRAVENOUS
  Filled 2023-08-17: qty 150

## 2023-08-17 MED ORDER — SODIUM CHLORIDE 0.9 % IV SOLN
3.0000 g | INTRAVENOUS | Status: DC
  Administered 2023-08-17: 3 g via INTRAVENOUS
  Filled 2023-08-17: qty 8

## 2023-08-17 MED ORDER — HYDROMORPHONE HCL 2 MG PO TABS
1.0000 mg | ORAL_TABLET | ORAL | 0 refills | Status: AC | PRN
  Filled 2023-08-17: qty 15, 5d supply, fill #0

## 2023-08-17 MED ORDER — VANCOMYCIN HCL 750 MG/150ML IV SOLN
750.0000 mg | Freq: Once | INTRAVENOUS | Status: DC
  Filled 2023-08-17 (×2): qty 150

## 2023-08-17 NOTE — Evaluation (Signed)
 Occupational Therapy Evaluation Patient Details Name: Samuel Gardner MRN: [id number] DOB: December 13, 1970 Today's Date: 08/17/2023   History of Present Illness   Pt is a 53 y.o. male presented to ED 6/2 post dialysis c/o of chills and chronic ulcer on R foot. 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 PTA pt was living with his wife and daughter and was independent with ADLs and IADLs. Today pt is requiring s for safety to set up  for ADLs. Bed mobility was mod I and functional transfers are  s for safety. Educated pt on dn/doff post-op shoe. Pt with adequate family support available at d/c, no follow up OT needs. DME needs below. OT will continue to follow acutely to maximize functional independence.        If plan is discharge home, recommend the following:   A little help with walking and/or transfers;A little help with bathing/dressing/bathroom     Functional Status Assessment   Patient has had a recent decline in their functional status and demonstrates the ability to make significant improvements in function in a reasonable and predictable amount of time.     Equipment Recommendations   BSC/3in1     Recommendations for Other Services         Precautions/Restrictions   Precautions Precautions: Fall Recall of Precautions/Restrictions: Intact Precaution/Restrictions Comments: Post-op shoe Restrictions Weight Bearing Restrictions Per Provider Order: Yes RLE Weight Bearing Per Provider Order: Non weight bearing     Mobility Bed Mobility Overal bed mobility: Independent       General bed mobility comments: From flat bed, no use of bed features    Transfers Overall transfer level: Needs assistance Equipment used: Rolling walker (2 wheels) Transfers: Sit to/from Stand, Bed to chair/wheelchair/BSC Sit to Stand: Supervision     Step  pivot transfers: Supervision     General transfer comment: With use of RW, able to maintain NWB status      Balance Overall balance assessment: Needs assistance Sitting-balance support: No upper extremity supported, Feet supported Sitting balance-Leahy Scale: Normal     Standing balance support: Bilateral upper extremity supported, During functional activity, Reliant on assistive device for balance Standing balance-Leahy Scale: Fair Standing balance comment: Reliant on RW         ADL either performed or assessed with clinical judgement   ADL Overall ADL's : Needs assistance/impaired Eating/Feeding: Set up;Sitting   Grooming: Wash/dry hands;Wash/dry face;Oral care;Supervision/safety;Standing Grooming Details (indicate cue type and reason): using sink to stabilize in standing         Upper Body Dressing : Set up;Sitting   Lower Body Dressing: Supervision/safety Lower Body Dressing Details (indicate cue type and reason): Able to figure 4 for legs, educated on how to don post-op shoe, benefits from RW to stabilize Toilet Transfer: Supervision/safety;Ambulation;Rolling walker (2 wheels) Toilet Transfer Details (indicate cue type and reason): Simulated in room Toileting- Clothing Manipulation and Hygiene: Supervision/safety;Sit to/from stand Toileting - Clothing Manipulation Details (indicate cue type and reason): Simualted in room     Functional mobility during ADLs: Supervision/safety;Rolling walker (2 wheels) General ADL Comments: Pt wife good family support of wife and daughter available at d/c if needed     Vision Baseline Vision/History: 0 No visual deficits Vision Assessment?: No apparent visual deficits            Pertinent Vitals/Pain Pain Assessment Pain Assessment:  0-10 Pain Score: 5  Pain Descriptors / Indicators: Discomfort Pain Intervention(s): Monitored during session     Extremity/Trunk Assessment Upper Extremity Assessment Upper Extremity  Assessment: Overall WFL for tasks assessed   Lower Extremity Assessment Lower Extremity Assessment: Defer to PT evaluation   Cervical / Trunk Assessment Cervical / Trunk Assessment: Normal   Communication Communication Communication: No apparent difficulties   Cognition Arousal: Alert Behavior During Therapy: WFL for tasks assessed/performed Cognition: No apparent impairments       Following commands: Intact       Cueing  General Comments   Cueing Techniques: Verbal cues  Educated on how to don post-op shoe           Home Living Family/patient expects to be discharged to:: Private residence Living Arrangements: Spouse/significant other;Children Available Help at Discharge: Family;Available 24 hours/day Type of Home: Apartment Home Access: Level entry     Home Layout: One level     Bathroom Shower/Tub: Chief Strategy Officer: Standard Bathroom Accessibility: Yes How Accessible: Accessible via walker Home Equipment: Cane - single point;Grab bars - tub/shower          Prior Functioning/Environment Prior Level of Function : Independent/Modified Independent     Mobility Comments: No AD      OT Problem List: Decreased range of motion;Decreased strength;Decreased activity tolerance;Impaired balance (sitting and/or standing)   OT Treatment/Interventions: Therapeutic exercise;Self-care/ADL training;DME and/or AE instruction;Energy conservation;Therapeutic activities;Patient/family education;Balance training      OT Goals(Current goals can be found in the care plan section)   Acute Rehab OT Goals Patient Stated Goal: To go home OT Goal Formulation: With patient Time For Goal Achievement: 08/31/23 Potential to Achieve Goals: Good   OT Frequency:  Min 2X/week       AM-PAC OT "6 Clicks" Daily Activity     Outcome Measure Help from another person eating meals?: None Help from another person taking care of personal grooming?: A Little Help  from another person toileting, which includes using toliet, bedpan, or urinal?: A Little Help from another person bathing (including washing, rinsing, drying)?: A Little Help from another person to put on and taking off regular upper body clothing?: A Little Help from another person to put on and taking off regular lower body clothing?: A Little 6 Click Score: 19   End of Session Equipment Utilized During Treatment: Rolling walker (2 wheels);Gait belt;Other (comment) (Post-op shoe) Nurse Communication: Mobility status  Activity Tolerance: Patient tolerated treatment well Patient left: in chair;with call bell/phone within reach;with chair alarm set  OT Visit Diagnosis: Unsteadiness on feet (R26.81);Other abnormalities of gait and mobility (R26.89)                Time: 7829-5621 OT Time Calculation (min): 19 min Charges:  OT General Charges $OT Visit: 1 Visit OT Evaluation $OT Eval Low Complexity: 1 Low  Samuel Gardner, OT  Acute Rehabilitation Services Office [phone] Secure chat preferred   Samuel Gardner 08/17/2023, 9:26 AM

## 2023-08-17 NOTE — Progress Notes (Signed)
 [facility] Progress Note   Subjective:   Reports his leg feels much better after his amputation, not having radiating pain anymore. Denies SOB, CP, dizziness. Reports HD went well.   Objective Vitals:   08/16/23 2012 08/17/23 0431 08/17/23 0808 08/17/23 0813  BP: (!) 99/47 (!) 128/52 (!) 138/57 (!) 138/57  Pulse: 67 67 60   Resp: 15  16   Temp: 98.1 F (36.7 C) 99.6 F (37.6 C) 98.3 F (36.8 C)   TempSrc: Oral Oral    SpO2: 95% 97% 95%   Weight:      Height:       Physical Exam General: Alert male in NAD Heart: RRR, no murmurs Lungs: CTA bilaterally, respirations unlabored on RA Abdomen: Soft, non-distended, +BS Extremities: No edema, foot wrapped Dialysis Access:  LUE AVF + t/b  Additional Objective Labs: Basic Metabolic Panel: Recent Labs  Lab 08/15/23 0552 08/16/23 0605 08/17/23 0613  NA 140 138 135  K 3.7 3.8 3.9  CL 97* 95* 94*  CO2 29 27 28   GLUCOSE 98 132* 127*  BUN 35* 53* 45*  CREATININE 5.57* 7.48* 6.57*  CALCIUM  8.7* 8.9 8.3*  PHOS  --  5.0* 5.4*   Liver Function Tests: Recent Labs  Lab 08/16/23 0605 08/17/23 0613  ALBUMIN 2.3* 2.4*   No results for input(s): "LIPASE", "AMYLASE" in the last 168 hours. CBC: Recent Labs  Lab 08/14/23 1840 08/15/23 0552 08/16/23 0605 08/17/23 0613  WBC 16.1*  16.6* 14.8* 14.5* 13.4*  NEUTROABS 13.0*  --   --   --   HGB 10.7*  10.7* 9.7* 10.2* 10.2*  HCT 34.0*  34.6* 30.9* 32.4* 32.7*  MCV 86.3  86.3 84.7 85.0 86.5  PLT 354  334 318 327 368   Blood Culture    Component Value Date/Time   SDES BONE BIOPSY 08/16/2023 1300   SPECREQUEST  1ST METATARSAL HEAD RIGHT PT ON VANC, CEFEPIME 08/16/2023 1300   CULT  08/16/2023 1300    TOO YOUNG TO READ Performed at [facility], [address], [place], [place] [address]    REPTSTATUS PENDING 08/16/2023 1300    Cardiac Enzymes: No results for input(s): "CKTOTAL", "CKMB", "CKMBINDEX", "TROPONINI" in the last 168  hours. CBG: Recent Labs  Lab 08/16/23 1045 08/16/23 1238  GLUCAP 112* 110*   Iron Studies: No results for input(s): "IRON", "TIBC", "TRANSFERRIN", "FERRITIN" in the last 72 hours. @lablastinr3 @ Studies/Results: DG Foot 2 Views Right Result Date: 08/16/2023 CLINICAL DATA:  Postop amputation EXAM: RIGHT FOOT - 2 VIEW COMPARISON:  08/14/2023. FINDINGS: Patient is status post amputation proximal cortex of the first metatarsal. Previous ORIF of the tibia intramedullary rod noted. No acute fracture, dislocation or subluxation. IMPRESSION: Postoperative changes. Electronically Signed   By: Sydell Eva M.D.   On: 08/16/2023 19:47   VAS US  ABI WITH/WO TBI Result Date: 08/16/2023  LOWER EXTREMITY DOPPLER STUDY Patient Name:  Samuel Gardner  Date of Exam:   08/16/2023 Medical Rec #: [id number]          Accession #:    [id number] Date of Birth: March 06, 1971           Patient Gender: M Patient Age:   53 years Exam Location:  [facility] Procedure:      VAS US  ABI WITH/WO TBI Referring Phys: Angela Kell --------------------------------------------------------------------------------  Indications: Peripheral artery disease. High Risk Factors: Hypertension, Diabetes. Other Factors: Left restricted arm.  Limitations: Today's exam was limited due to bandages. Comparison Study:  07/13/2023 - Right: Resting right ankle-brachial index indicates                   noncompressible right                   lower extremity arteries. The right toe-brachial index is                   abnormal.                    Left: Resting left ankle-brachial index indicates                   noncompressible left                   lower extremity arteries. The left toe-brachial index is                   abnormal. Performing Technologist: Birda Buffy RVT  Examination Guidelines: A complete evaluation includes at minimum, Doppler waveform signals and systolic blood pressure reading at the level of bilateral brachial, anterior tibial, and  posterior tibial arteries, when vessel segments are accessible. Bilateral testing is considered an integral part of a complete examination. Photoelectric Plethysmograph (PPG) waveforms and toe systolic pressure readings are included as required and additional duplex testing as needed. Limited examinations for reoccurring indications may be performed as noted.  ABI Findings: +---------+------------------+-----+----------+--------+ Right    Rt Pressure (mmHg)IndexWaveform  Comment  +---------+------------------+-----+----------+--------+ Brachial 161                    triphasic          +---------+------------------+-----+----------+--------+ PTA      254               1.58 monophasic         +---------+------------------+-----+----------+--------+ DP       245               1.52 monophasic         +---------+------------------+-----+----------+--------+ Great Toe                       Absent             +---------+------------------+-----+----------+--------+ +---------+------------------+-----+----------+----------+ Left     Lt Pressure (mmHg)IndexWaveform  Comment    +---------+------------------+-----+----------+----------+ Brachial                                  Restricted +---------+------------------+-----+----------+----------+ PTA      141               0.88 monophasic           +---------+------------------+-----+----------+----------+ DP       163               1.01 monophasic           +---------+------------------+-----+----------+----------+ Great Toe                       Absent               +---------+------------------+-----+----------+----------+ +-------+-----------+-----------+------------+------------+ ABI/TBIToday's ABIToday's TBIPrevious ABIPrevious TBI +-------+-----------+-----------+------------+------------+ Right  [place]                    [place]          0.41          +-------+-----------+-----------+------------+------------+  Left   1.01                  [place]          0.23         +-------+-----------+-----------+------------+------------+  Summary: Right: Resting right ankle-brachial index indicates noncompressible right lower extremity arteries. Unable to obtain TBI due to absent waveforms. Left: Resting left ankle-brachial index is within normal range. ABI is likely falsely elevated due to medial calcification. Unable to obtain TBI due to absent waveforms. *See table(s) above for measurements and observations.  Electronically signed by Jimmye Moulds MD on 08/16/2023 at 2:16:27 PM.    Final    MR FOOT RIGHT WO CONTRAST Result Date: 08/15/2023 CLINICAL DATA:  Forefoot wound.  Concern for osteomyelitis. EXAM: MRI OF THE RIGHT FOREFOOT WITHOUT CONTRAST TECHNIQUE: Multiplanar, multisequence MR imaging of the right forefoot was performed. No intravenous contrast was administered. COMPARISON:  Radiographs 08/14/2023 FINDINGS: Bones/Joint/Cartilage Apparent soft tissue wound along the dorsal and medial aspect of the 1st metatarsophalangeal joint. As seen on recent radiographs, there is cortical destruction of the 1st metatarsal head consistent with osteomyelitis. There is corresponding decreased T1 marrow signal throughout the 1st metatarsal head and neck. There is marrow T2 hyperintensity throughout the 1st metatarsal and proximal 1st phalanx with probable early cortical destruction involving the medial base of the proximal phalanx. There is a small complex effusion of the 1st metatarsophalangeal joint. There is also mild nonspecific marrow T2 hyperintensity within the 2nd metatarsal head and medial base of the proximal 2nd phalanx without T1 signal abnormality or cortical destruction. The additional metatarsals and toes appear unremarkable. Probable reactive or neurogenic marrow changes in the cuboid and lateral cuneiform. No other significant joint effusions. Ligaments  Intact Lisfranc ligament. The collateral ligaments of the metatarsophalangeal joints appear intact. Muscles and Tendons Nonspecific generalized forefoot muscular edema. There is a small amount of fluid within the flexor digitorum tendon sheath. No evidence of tendon tear. Soft tissues As above, apparent soft tissue ulceration along the dorsal and medial aspect of the 1st metatarsal-phalangeal joint with an underlying joint effusion and evidence of osteomyelitis as described above. No organized fluid collection identified. There is mild dorsal subcutaneous edema in the lateral aspect of the forefoot. IMPRESSION: 1. Findings are consistent with osteomyelitis of the 1st metatarsal head and neck and base of the proximal phalanx with underlying soft tissue ulceration along the dorsal and medial aspect of the 1st metatarsophalangeal joint. Marrow infection may extend into the 1st metatarsal shaft and proximal 1st phalanx. 2. Small complex effusion of the 1st metatarsophalangeal joint, suspicious for septic arthritis. 3. Mild nonspecific marrow T2 hyperintensity within the 2nd metatarsal head and medial base of the proximal 2nd phalanx, possibly reactive. 4. Nonspecific generalized forefoot muscular edema. No organized fluid collection identified. Electronically Signed   By: Elmon Hagedorn M.D.   On: 08/15/2023 17:24   Medications:  ampicillin-sulbactam (UNASYN) IV     vancomycin      acetaminophen   1,000 mg Oral Q6H   amLODipine   5 mg Oral Daily   aspirin  EC  81 mg Oral Daily   carvedilol   25 mg Oral BID WC   Chlorhexidine  Gluconate Cloth  6 each Topical Q0600   furosemide   80 mg Oral BID   gabapentin   300 mg Oral QHS   heparin   5,000 Units Subcutaneous Q8H   hydrALAZINE   100 mg Oral Q8H   isosorbide  mononitrate  90 mg Oral Daily   rosuvastatin   10 mg Oral  Daily   senna-docusate  1 tablet Oral QHS   vancomycin variable dose per unstable renal function (pharmacist dosing)   Does not apply See admin  instructions    Dialysis Orders: [facility]  4h  80.5kg  B400   AVF   Heparin  none  Last OP HD 6/02, post wt 80.3kg Usual UF 1.5- 3 kg, gets to dry wt regularly Last mircera 50 mcg on 5/02  Assessment/Plan: R foot osteomyelitis: rigor's prior to presentation, WBC 16K here. Blood cx's pending. S/p amputation, mgt per podiatry  ESRD: on HD MWF. Continue on schedule, HD tomorrow.  HTN: BP slightly elevated, continue home medications Volume: no sig vol excess on exam.  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

## 2023-08-17 NOTE — Discharge Summary (Signed)
 Name: SAAGAR TORTORELLA MRN: [id number] DOB: [birth year] 53 y.o. PCP: Cleven Dallas, DO  Date of Admission: 08/14/2023  5:54 PM Date of Discharge:  08/17/2023 Attending Physician: Dr. 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   albuterol  108 (90 Base) MCG/ACT inhaler Commonly known as: VENTOLIN  HFA Inhale 2 puffs into the lungs every 6 (six) hours as needed for wheezing or shortness of breath.   amLODipine  5 MG tablet Commonly known as: NORVASC  Take 1 tablet (5 mg total) by mouth daily.   amoxicillin-clavulanate 500-125 MG tablet Commonly known as: Augmentin Take 1 tablet by mouth at bedtime for 27 doses. Administer dose after hemodialysis Start taking on: August 18, 2023   aspirin  EC 81 MG tablet Take 1 tablet (81 mg total) by mouth daily. Swallow whole.   calcitRIOL 0.25 MCG capsule Commonly known as: ROCALTROL Take 0.25 mcg by mouth daily.   carvedilol  25 MG tablet Commonly known as: COREG  TAKE 1 TABLET 2 TIMES DAILY   clopidogrel  75 MG tablet Commonly known as: PLAVIX  TAKE 1 TABLET(75 MG) BY MOUTH DAILY WITH BREAKFAST   furosemide  80 MG tablet Commonly known as: LASIX  TAKE 1 TABLET BY MOUTH TWICE DAILY   gabapentin  300 MG capsule Commonly known as: Neurontin  Take 1 capsule (300 mg total) by mouth at  bedtime.   hydrALAZINE  100 MG tablet Commonly known as: APRESOLINE  Take 1 tablet (100 mg total) by mouth 3 (three) times daily.   HYDROmorphone  2 MG tablet Commonly known as: DILAUDID  Take 0.5 tablets (1 mg total) by mouth every 4 (four) hours as needed for up to 5 days for severe pain (pain score 7-10).   isosorbide  mononitrate 30 MG 24 hr tablet Commonly known as: IMDUR  TAKE 3 TABLETS(90 MG) BY MOUTH DAILY   nitroGLYCERIN  0.4 MG SL tablet Commonly known as: NITROSTAT  Place 1 tablet (0.4 mg total) under the tongue every 5 (five) minutes as needed for chest pain.   Pro Comfort Spacer Adult Misc Use spacer as needed with inhalers.   rosuvastatin  10 MG tablet Commonly known as: CRESTOR  Take 1 tablet (10 mg total) by mouth daily. Start taking on: August 18, 2023 What changed:  medication strength how much to take               Durable Medical Equipment  (From admission, onward)           Start     Ordered   08/17/23 1130  For home use only DME Walker rolling  Once       Question Answer Comment  Walker: With 5 Inch Wheels   Patient needs a walker to treat with the following condition Weakness      08/17/23 1130   08/17/23 1130  For  home use only DME Bedside commode  Once       Question:  Patient needs a bedside commode to treat with the following condition  Answer:  Weakness   08/17/23 1130              Discharge Care Instructions  (From admission, onward)           Start     Ordered   08/17/23 0000  Leave dressing on - Keep it clean, dry, and intact until clinic visit        08/17/23 1204            DISPOSITION AND FOLLOW-UP:  Mr.Rowland L Genest was discharged from [facility] in stable condition. At the hospital follow up visit please address:  Follow-up Recommendations: Consults: Vascular surgery, Nephrology, Podiatry Medications: Short course of Dilaudid  was prescribed for breakthrough pain (1/2 tablet every 4 hours  as needed). Please ensure pain remains well controlled off of this.   Follow-up Appointments: - [facility] will call patient to schedule follow-up appointment.    HOSPITAL COURSE:  Patient Summary: Samuel Gardner is a 53 y.o. male with a pertinent past medical history of peripheral artery disease, ESRD (on MWF HD), hypertension, T2DM, and chronic R foot ulcer admitted for fever and R leg pain, found to have acute osteomyelitis.   Below is a summary of the patient's hospitalization by problem.   Acute osteomyelitis  Chronic R foot ulcer  XR R foot/ankle on admission demonstrated focal lucencies and cortical erosions in the medial and lateral aspect of the first metatarsal head, concerning for osteomyelitis. Initially febrile with leukocytosis but since ED presentation he remained afebrile and hemodynamically stable. Consulted Podiatry, who recommended MRI R foot w/o contrast, which demonstrated osteomyelitis of the 1st metatarsal head/neck and base of proximal phalanx which underlying soft tissue ulceration, consistent with prior clinical assessment. 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 Antibiotic regimen of vancomycin/cefepime was narrowed to vancomycin/Augmentin (total duration 4 weeks) at the time of discharge. He was evaluated by PT/OT who determined he did not require further therapy. He was discharged home with a post-op shoe and wrapping in place with instructions to not bear weight on the foot. A short course of Dilaudid  was prescribed for pain control.   Peripheral artery disease  Vascular surgery was consulted during admission given patient's history of severe PAD s/p BLE revascularization. ABIs were ordered which demonstrated non compressible RLE arteries.   ESRD on MWF HD  Remained euvolemic on physical examinations. Nephrology was consulted and he received HD on 6/4. His home furosemide  was continued.   Normocytic anemia  Found to  have a normocytic anemia with Hgb 9-10 and MCV 80's. Suspect multifactorial given inflammatory state in the setting of CKD and osteomyelitis. He remained asymptomatic and hemodynamically stable throughout admission and did not require transfusion.   Hypertension  Hyperlipidemia  His home medications - amlodipine , carvedilol , hydralazine , Imdur , Rosuvastatin  - were continued throughout admission.    DISCHARGE INSTRUCTIONS:   Discharge Instructions     Call MD for:  persistant dizziness or light-headedness   Complete by: As directed    Call MD for:  persistant nausea and vomiting   Complete by: As directed    Call MD for:  redness, tenderness, or signs of infection (pain, swelling, redness, odor or green/yellow discharge around incision site)   Complete by: As directed    Call MD for:  severe  uncontrolled pain   Complete by: As directed    Call MD for:  temperature >100.4   Complete by: As directed    Discharge instructions   Complete by: As directed    You were admitted to the hospital for osteomyelitis (bone infection). You were treated with antibiotics and underwent surgery to remove the infected part of your toe.   After discharge, you will take 2 antibiotics for a total of 4 weeks (until 7/3): 1. Vancomycin: You will receive this during dialysis. 2. Augmentin: You will take this medicine twice daily.  You were prescribed a short course of dilaudid . You may take 1 mg (1/2 tablet) every 4 hours as needed for severe pain. You can also take tylenol  and continue your prior dose of gabapentin .  As instructed by the surgery team, you should not bear weight on your right foot until you follow-up in clinic and receive further instructions. Please keep your dressing in place until clinic-follow up and ensure it remains clean and dry.  We wish you a speedy recovery!   Increase activity slowly   Complete by: As directed    Leave dressing on - Keep it clean, dry, and intact until clinic  visit   Complete by: As directed        SUBJECTIVE:  Patient reports he is feeling much better this morning. He denies any pain to his R foot and states that the previous pain he had to the R leg has resolved. He is eating and drinking at baseline without nausea. No new concerns this morning.   Discharge Vitals:   BP (!) 138/57   Pulse 60   Temp 98.3 F (36.8 C)   Resp 16   Ht 5\' 7"  (1.702 m)   Wt 80.3 kg   SpO2 95%   BMI 27.73 kg/m   OBJECTIVE:  Physical Exam Constitutional:      General: He is not in acute distress. HENT:     Mouth/Throat:     Mouth: Mucous membranes are moist.  Eyes:     Conjunctiva/sclera: Conjunctivae normal.  Cardiovascular:     Rate and Rhythm: Normal rate and regular rhythm.     Heart sounds: Normal heart sounds.  Pulmonary:     Effort: Pulmonary effort is normal.     Breath sounds: Normal breath sounds.  Abdominal:     General: Abdomen is flat. There is no distension.     Palpations: Abdomen is soft.     Tenderness: There is no abdominal tenderness.  Musculoskeletal:        General: No tenderness. Normal range of motion.     Comments: Dressing to RLE. Clean, dry, intact.   Skin:    General: Skin is warm and dry.  Neurological:     General: No focal deficit present.     Mental Status: He is alert and oriented to person, place, and time.  Psychiatric:        Mood and Affect: Mood normal.        Behavior: Behavior normal.     Pertinent Labs, Studies, and Procedures:     Latest Ref Rng & Units 08/17/2023    6:13 AM 08/16/2023    6:05 AM 08/15/2023    5:52 AM  CBC  WBC 4.0 - 10.5 K/uL 13.4  14.5  14.8   Hemoglobin 13.0 - 17.0 g/dL 11.9  14.7  9.7   Hematocrit 39.0 - 52.0 % 32.7  32.4  30.9   Platelets 150 -  400 K/uL 368  327  318        Latest Ref Rng & Units 08/17/2023    6:13 AM 08/16/2023    6:05 AM 08/15/2023    5:52 AM  CMP  Glucose 70 - 99 mg/dL 161  096  98   BUN 6 - 20 mg/dL 45  53  35   Creatinine 0.61 - 1.24 mg/dL 0.45  4.09   8.11   Sodium 135 - 145 mmol/L 135  138  140   Potassium 3.5 - 5.1 mmol/L 3.9  3.8  3.7   Chloride 98 - 111 mmol/L 94  95  97   CO2 22 - 32 mmol/L 28  27  29    Calcium  8.9 - 10.3 mg/dL 8.3  8.9  8.7     MR FOOT RIGHT WO CONTRAST Result Date: 08/15/2023 CLINICAL DATA:  Forefoot wound.  Concern for osteomyelitis. EXAM: MRI OF THE RIGHT FOREFOOT WITHOUT CONTRAST TECHNIQUE: Multiplanar, multisequence MR imaging of the right forefoot was performed. No intravenous contrast was administered. COMPARISON:  Radiographs 08/14/2023 FINDINGS: Bones/Joint/Cartilage Apparent soft tissue wound along the dorsal and medial aspect of the 1st metatarsophalangeal joint. As seen on recent radiographs, there is cortical destruction of the 1st metatarsal head consistent with osteomyelitis. There is corresponding decreased T1 marrow signal throughout the 1st metatarsal head and neck. There is marrow T2 hyperintensity throughout the 1st metatarsal and proximal 1st phalanx with probable early cortical destruction involving the medial base of the proximal phalanx. There is a small complex effusion of the 1st metatarsophalangeal joint. There is also mild nonspecific marrow T2 hyperintensity within the 2nd metatarsal head and medial base of the proximal 2nd phalanx without T1 signal abnormality or cortical destruction. The additional metatarsals and toes appear unremarkable. Probable reactive or neurogenic marrow changes in the cuboid and lateral cuneiform. No other significant joint effusions. Ligaments Intact Lisfranc ligament. The collateral ligaments of the metatarsophalangeal joints appear intact. Muscles and Tendons Nonspecific generalized forefoot muscular edema. There is a small amount of fluid within the flexor digitorum tendon sheath. No evidence of tendon tear. Soft tissues As above, apparent soft tissue ulceration along the dorsal and medial aspect of the 1st metatarsal-phalangeal joint with an underlying joint effusion and  evidence of osteomyelitis as described above. No organized fluid collection identified. There is mild dorsal subcutaneous edema in the lateral aspect of the forefoot. IMPRESSION: 1. Findings are consistent with osteomyelitis of the 1st metatarsal head and neck and base of the proximal phalanx with underlying soft tissue ulceration along the dorsal and medial aspect of the 1st metatarsophalangeal joint. Marrow infection may extend into the 1st metatarsal shaft and proximal 1st phalanx. 2. Small complex effusion of the 1st metatarsophalangeal joint, suspicious for septic arthritis. 3. Mild nonspecific marrow T2 hyperintensity within the 2nd metatarsal head and medial base of the proximal 2nd phalanx, possibly reactive. 4. Nonspecific generalized forefoot muscular edema. No organized fluid collection identified. Electronically Signed   By: Elmon Hagedorn M.D.   On: 08/15/2023 17:24   DG Ankle Complete Right Result Date: 08/14/2023 CLINICAL DATA:  Wound on the foot. EXAM: RIGHT ANKLE - COMPLETE 3+ VIEW COMPARISON:  None Available. FINDINGS: There is no acute fracture or focal osseous lesion. There is no dislocation. Tibial intramedullary nail and distal screw appear uncomplicated. There is soft tissue swelling surrounding the ankle. Peripheral vascular calcifications are present. IMPRESSION: 1. No acute fracture or dislocation. 2. Soft tissue swelling surrounding the ankle. Electronically Signed   By: Egbert Grass  Naaman Au M.D.   On: 08/14/2023 19:52   DG Foot Complete Right Result Date: 08/14/2023 CLINICAL DATA:  Wound on the foot plate EXAM: RIGHT FOOT COMPLETE - 3+ VIEW COMPARISON:  None Available. FINDINGS: There are focal lucencies and cortical erosions in the medial and lateral aspect of the first metatarsal head. There is overlying soft tissue swelling. No acute fracture or dislocation. Peripheral vascular calcifications are present. IMPRESSION: Focal lucencies and cortical erosions in the medial and lateral aspect  of the first metatarsal head, concerning for osteomyelitis. Electronically Signed   By: Tyron Gallon M.D.   On: 08/14/2023 19:49     Signed:  Harlie Lieu, MS4

## 2023-08-17 NOTE — Progress Notes (Addendum)
 Pharmacy Antibiotic Note  Samuel Gardner is a 53 y.o. male with medical history significant for PAD and ESRD on HD MWF who is admitted on 08/14/2023 with concerns for osteomyelitis. His dialysis team gave him vancomycin and cefepime before he left the dialysis center. Random level 19 on 6/03. Pharmacy has been consulted for vancomycin dosing.   Vancomycin not administered after HD on 6/4. Per [facility] antibiotic nomogram, pt could receive 750mg  or 1000mg  vancomycin post-HD as he is 80kg. Due to low flow rate (300) at 3.5 hours, lower dose of 750mg  was chosen with plan to be administered 6/05.   Plan: Vancomycin 750mg  IV qHD MWF  Cefepime 1g IV Q24h ---> Unasyn 3g Q24h (administer first dose @ 1300 - plan for Augmentin 500/125mg  Q24 post-HD at discharge) Trend WBC, fever, clinical progress,  F/u cultures, levels as indicated De-escalate when able   Height: 5\' 7"  (170.2 cm) Weight: 80.3 kg (177 lb 0.5 oz) IBW/kg (Calculated) : 66.1  Temp (24hrs), Avg:98.5 F (36.9 C), Min:98 F (36.7 C), Max:99.6 F (37.6 C)  Recent Labs  Lab 08/14/23 1840 08/14/23 1845 08/15/23 0552 08/15/23 1004 08/16/23 0605 08/17/23 0613  WBC 16.1*  16.6*  --  14.8*  --  14.5* 13.4*  CREATININE 4.13*  --  5.57*  --  7.48* 6.57*  LATICACIDVEN  --  1.0  --   --   --   --   VANCORANDOM  --   --   --  19  --   --     Estimated Creatinine Clearance: 13.2 mL/min (A) (by C-G formula based on SCr of 6.57 mg/dL (H)).    Allergies  Allergen Reactions   Atorvastatin      Muscle cramping    Microbiology results: 6/02 BCx: pending, no growth at 48h 6/04 surgical path: rare GPC, pending  Thank you for allowing pharmacy to be a part of this patient's care.  Dollie Freshwater, PharmD Candidate   ATTESTATION:   I have read and reviewed the note with assessment and plan as written by Dollie Freshwater, Student-pharmacist and agree with the plan as documented.   Chrystie Crass, PharmD Clinical Pharmacist  08/17/2023 10:48  AM

## 2023-08-17 NOTE — Evaluation (Signed)
 Physical Therapy Evaluation Patient Details Name: Samuel Gardner MRN: [id number] DOB: October 14, 1970 Today's Date: 08/17/2023  History of Present Illness  Pt is a 53 y.o. male presented to ED 6/2 post dialysis c/o of chills and chronic ulcer on R foot. S/p partial first ray amputation 6/04  PMH: PAD, ESRD, HTN, DM, HFpEH  Clinical Impression  Pt presents with R foot discomfort post-op, otherwise demonstrates good strength, balance, maintenance of NWB RLE. Pt ambulating 30 ft with use of RW and supervision for safety, no physical assist needed. Pt likely will be limited to short-distance gait with RW in home since he is strictly NWB RLE at this time, pt has a knee scooter at home so PT told pt to ask podiatry team if they are okay with him using this. Pt has good balance and would be a good knee scooter candidate, declines wanting to practice it today. Pt with no further acute or post-acute PT needs at this time, okay to d/c from a PT perspective.        If plan is discharge home, recommend the following:  N/a   Can travel by private vehicle    Yes     Equipment Recommendations Rolling walker (2 wheels);BSC/3in1  Recommendations for Other Services       Functional Status Assessment Patient has had a recent decline in their functional status and demonstrates the ability to make significant improvements in function in a reasonable and predictable amount of time.     Precautions / Restrictions Precautions Precautions: Fall Recall of Precautions/Restrictions: Intact Restrictions Weight Bearing Restrictions Per Provider Order: Yes RLE Weight Bearing Per Provider Order: Non weight bearing Other Position/Activity Restrictions: in post op shoe      Mobility  Bed Mobility               General bed mobility comments: up in chair    Transfers Overall transfer level: Needs assistance Equipment used: Rolling walker (2 wheels) Transfers: Sit to/from Stand Sit to Stand: Supervision            General transfer comment: for safety, cues for NWB RLE which pt followed well    Ambulation/Gait Ambulation/Gait assistance: Supervision Gait Distance (Feet): 30 Feet Assistive device: Rolling walker (2 wheels) Gait Pattern/deviations: Step-through pattern, Decreased stride length, Trunk flexed Gait velocity: decr     General Gait Details: hop-to gait pattern with good maintenace of NWB RLE, cues for slowing down and sequencing  Stairs            Wheelchair Mobility     Tilt Bed    Modified Rankin (Stroke Patients Only)       Balance Overall balance assessment: Needs assistance Sitting-balance support: No upper extremity supported, Feet supported Sitting balance-Leahy Scale: Normal     Standing balance support: Bilateral upper extremity supported, During functional activity, Reliant on assistive device for balance Standing balance-Leahy Scale: Fair Standing balance comment: Reliant on RW                             Pertinent Vitals/Pain Pain Assessment Pain Assessment: Faces Faces Pain Scale: Hurts a little bit Pain Descriptors / Indicators: Discomfort Pain Intervention(s): Limited activity within patient's tolerance, Monitored during session, Repositioned    Home Living Family/patient expects to be discharged to:: Private residence Living Arrangements: Spouse/significant other;Children Available Help at Discharge: Family;Available 24 hours/day Type of Home: Apartment Home Access: Level entry       Home  Layout: One level Home Equipment: Cane - single point;Grab bars - tub/shower Additional Comments: has a knee scooter that he used last time for L foot NWB    Prior Function Prior Level of Function : Independent/Modified Independent;Driving             Mobility Comments: No AD for gait, drives       Extremity/Trunk Assessment   Upper Extremity Assessment Upper Extremity Assessment: Defer to OT evaluation    Lower  Extremity Assessment Lower Extremity Assessment: Overall WFL for tasks assessed    Cervical / Trunk Assessment Cervical / Trunk Assessment: Normal  Communication   Communication Communication: No apparent difficulties    Cognition Arousal: Alert Behavior During Therapy: WFL for tasks assessed/performed   PT - Cognitive impairments: No apparent impairments                       PT - Cognition Comments: good adherence to NWB RLE And good understanding of importance of this Following commands: Intact       Cueing Cueing Techniques: Verbal cues     General Comments General comments (skin integrity, edema, etc.): pt states he has a knee scooter, pt declines practicing knee scooter use. PT encouraged use of RW for short distances in home and only use knee scooter with permission from podiatry team. Knee scooter would be good for longer distances while he is NWB RLE    Exercises     Assessment/Plan    PT Assessment Patient does not need any further PT services  PT Problem List         PT Treatment Interventions      PT Goals (Current goals can be found in the Care Plan section)  Acute Rehab PT Goals PT Goal Formulation: With patient Time For Goal Achievement: 08/17/23 Potential to Achieve Goals: Good    Frequency       Co-evaluation               AM-PAC PT "6 Clicks" Mobility  Outcome Measure Help needed turning from your back to your side while in a flat bed without using bedrails?: None Help needed moving from lying on your back to sitting on the side of a flat bed without using bedrails?: None Help needed moving to and from a bed to a chair (including a wheelchair)?: None Help needed standing up from a chair using your arms (e.g., wheelchair or bedside chair)?: None Help needed to walk in hospital room?: A Little Help needed climbing 3-5 steps with a railing? : A Little 6 Click Score: 22    End of Session   Activity Tolerance: Patient  tolerated treatment well Patient left: in chair;with call bell/phone within reach;with chair alarm set Nurse Communication: Mobility status PT Visit Diagnosis: Other abnormalities of gait and mobility (R26.89);Muscle weakness (generalized) (M62.81)    Time: 2836-6294 PT Time Calculation (min) (ACUTE ONLY): 14 min   Charges:   PT Evaluation $PT Eval Low Complexity: 1 Low   PT General Charges $$ ACUTE PT VISIT: 1 Visit         Shirlene Doughty, PT DPT Acute Rehabilitation Services Secure Chat Preferred  Office [phone]   Coy Rochford Cydney Draft 08/17/2023, 9:36 AM

## 2023-08-17 NOTE — Progress Notes (Signed)
    Durable Medical Equipment  (From admission, onward)           Start     Ordered   08/17/23 1130  For home use only DME Walker rolling  Once       Question Answer Comment  Walker: With 5 Inch Wheels   Patient needs a walker to treat with the following condition Weakness      08/17/23 1130   08/17/23 1130  For home use only DME Bedside commode  Once       Question:  Patient needs a bedside commode to treat with the following condition  Answer:  Weakness   08/17/23 1130            BSC:  The patient is confined to one level of the home environment and there is no toilet on the that level of the home.

## 2023-08-17 NOTE — TOC Initial Note (Signed)
 Transition of Care [facility]) - Initial/Assessment Note    Patient Details  Name: Samuel Gardner MRN: [id number] Date of Birth: [birth year]  Transition of Care [facility]) CM/SW Contact:    Jonathan Neighbor, RN Phone Number: 08/17/2023, 11:40 AM  Clinical Narrative:                  Pt is from home with his spouse. She works from home and is able to provide needed support.  Walker/ BSC ordered through [employer] and will be delivered to the room.  No therapy needs per PT/OT. Wife provides needed transportation and manages his medications at home.  Expected Discharge Plan: Home/Self Care     Patient Goals and CMS Choice     Choice offered to / list presented to : Patient      Expected Discharge Plan and Services   Discharge Planning Services: CM Consult   Living arrangements for the past 2 months: Apartment                 DME Arranged: Bedside commode, Walker rolling DME Agency: [employer] Date DME Agency Contacted: 08/17/23   Representative spoke with at DME Agency: Zack            Prior Living Arrangements/Services Living arrangements for the past 2 months: Apartment Lives with:: Spouse Patient language and need for interpreter reviewed:: Yes Do you feel safe going back to the place where you live?: Yes        Care giver support system in place?: Yes (comment)   Criminal Activity/Legal Involvement Pertinent to Current Situation/Hospitalization: No - Comment as needed  Activities of Daily Living      Permission Sought/Granted                  Emotional Assessment Appearance:: Appears stated age Attitude/Demeanor/Rapport: Engaged Affect (typically observed): Accepting Orientation: : Oriented to Self, Oriented to Place, Oriented to  Time, Oriented to Situation   Psych Involvement: No (comment)  Admission diagnosis:  Diabetic osteomyelitis (HCC) [E11.69, M86.9] Osteomyelitis of metatarsal (HCC) [M86.9] Leukocytosis, unspecified type [D72.829] Patient  Active Problem List   Diagnosis Date Noted   Osteomyelitis of metatarsal (HCC) 08/16/2023   Osteomyelitis (HCC) 08/15/2023   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) 08/15/2023   Right leg pain 08/08/2023   Nonhealing ulcer of left lower extremity (HCC) 04/26/2023   PAD (peripheral artery disease) (HCC) 04/19/2023   Ulcer of foot (HCC) 04/18/2023   Screening for colon cancer 04/11/2023   Benign neoplasm of ascending colon 04/11/2023   Chronic kidney disease (CKD), stage IV (severe) (HCC) 04/11/2023   Ascending aorta dilatation (HCC)    Mitral regurgitation    Dyspnea 11/11/2022   Colon cancer screening declined 07/06/2022   Tobacco use 05/23/2022   Normocytic anemia 02/23/2022   ESRD on dialysis (HCC) 02/10/2022   (HFpEF) heart failure with preserved ejection fraction (HCC) 02/10/2022   Essential hypertension 02/10/2022   Hyperkalemia 02/09/2022   PCP:  Cleven Dallas, DO Pharmacy:   [id number] Jonette Nestle,  - [address] AT [facility] OF [facility] [address] [place] [address] Phone: [phone] Fax: [phone]     Social Drivers of Health (SDOH) Social History: SDOH Screenings   Food Insecurity: No Food Insecurity (08/16/2023)  Housing: Low Risk  (08/16/2023)  Transportation Needs: No Transportation Needs (08/16/2023)  Utilities: Not At Risk (08/16/2023)  Depression (PHQ2-9): Low Risk  (08/08/2023)  Social Connections:  Moderately Isolated (02/23/2022)  Tobacco Use: Medium Risk (08/16/2023)   SDOH Interventions:     Readmission Risk Interventions     No data to display

## 2023-08-17 NOTE — Progress Notes (Signed)
  Subjective:  Patient ID: Samuel Gardner, male    DOB: 1970-10-28,  MRN: [id number]   DOS: 08/16/2023 Procedure: 1.  Partial first ray amputation, right foot 2.  Application of amniotic graft 5 x 5 cm, right foot  53 y.o. male seen for post op check. He reports he is doing well no pain currently. Has heel shoe in room and worked with PT was able to maintain Nwb with walker.  Discussed findings from surgery and plan going forward.   Review of Systems: Negative except as noted in the HPI. Denies N/V/F/Ch.   Objective:   Constitutional Well developed. Well nourished.  Vascular Foot warm and well perfused. Capillary refill normal to all digits.   No calf pain with palpation  Neurologic Normal speech. Oriented to person, place, and time. Epicritic sensation diminished to right foot  Dermatologic Dressing clean dry and intact right foot  Orthopedic: Status post right partial first ray amputation   Radiographs: Interval amputation of the first metatarsal at the proximal shaft  Pathology: Pending  Micro: Rare gram-positive cocci pending  Assessment:   1. Osteomyelitis of metatarsal (HCC)   2. Leukocytosis, unspecified type   Status post right foot partial first ray amputation with amniotic graft application  Plan:  Patient was evaluated and treated and all questions answered.  POD # 1 s/p right partial first ray amp -Progressing well post op, pain controlled, dressing intact -XR: Expected postoperative changes -WB Status: Nonweightbearing in postop shoe for protection -Sutures: Remain intact 2 to 3 weeks. -Medications/ABX: Follow culture and pathology to ensure clearance of osseous infection as outpatient, if positive at proximal margin will need to extend IV abx at HD - Agree with 4 weeks IV vancomycin at HD and PO augmentin  -Dressing: Dressing remain clean dry and intact until office follow up next week - F/u Plan: Pt to follow up next week Thursday, office to call to  arrange. Will sign off.        [name], DPM [facility] / [facility]

## 2023-08-17 NOTE — Discharge Planning (Signed)
 [facility] Patient Discharge Orders- [facility] CLINIC: [place]  Patient's name: Samuel Gardner Admit/DC Dates: 08/14/2023 - 08/17/23  Discharge Diagnoses: R foot osteomyelitis s/p  partial ray amputation    Aranesp : Given: none   Date and amount of last dose: N/A  Last Hgb: 10.2 PRBC's Given: none Date/# of units: N/A ESA dose for discharge: mircera 50 mcg IV q 2 weeks  IV Iron dose at discharge: none  Heparin  change: no  EDW Change: no New EDW:   [place] Change: no  Access intervention/Change: no Details:  Hectorol/Calcitriol change: no  Discharge Labs: Calcium  8.3 Phosphorus 5.4 Albumin 2.4 K+ 3.9  IV Antibiotics: yes Details: vancomycin 750mg  IV q HD- stop date 09/14/23. Weekly vanc trough  On Coumadin ?: no Last INR: Next INR: Managed By:   OTHER/APPTS/LAB ORDERS:    D/C Meds to be reconciled by nurse after every discharge.  Completed By: [name], [name] 08/17/2023, 12:51 PM  [facility] Pager: [phone]    Reviewed by: MD:______ RN_______

## 2023-08-18 ENCOUNTER — Telehealth: Payer: Self-pay | Admitting: Physician Assistant

## 2023-08-18 ENCOUNTER — Encounter (HOSPITAL_COMMUNITY): Payer: Self-pay

## 2023-08-18 ENCOUNTER — Telehealth: Payer: Self-pay

## 2023-08-18 ENCOUNTER — Encounter: Payer: Self-pay | Admitting: Student

## 2023-08-18 DIAGNOSIS — S98921A Partial traumatic amputation of right foot, level unspecified, initial encounter: Secondary | ICD-10-CM | POA: Insufficient documentation

## 2023-08-18 LAB — SURGICAL PATHOLOGY

## 2023-08-18 MED ORDER — ASPIRIN 81 MG PO TBEC: 81.0000 mg | DELAYED_RELEASE_TABLET | Freq: Every day | ORAL | 0 refills | Status: DC

## 2023-08-18 NOTE — Transitions of Care (Post Inpatient/ED Visit) (Signed)
 08/18/2023  Name: Samuel Gardner MRN: [id number] DOB: 1970-05-14  Today's TOC FU Call Status: Today's TOC FU Call Status:: Successful TOC FU Call Completed TOC FU Call Complete Date: 08/18/23 Patient's Name and Date of Birth confirmed.  Transition Care Management Follow-up Telephone Call Date of Discharge: 08/17/23 Discharge Facility: [facility]) Type of Discharge: Inpatient Admission Primary Inpatient Discharge Diagnosis:: Osteomyelitis How have you been since you were released from the hospital?: Better Any questions or concerns?: No  Items Reviewed: Did you receive and understand the discharge instructions provided?: Yes Medications obtained,verified, and reconciled?: Yes (Medications Reviewed) Any new allergies since your discharge?: No Dietary orders reviewed?: Yes Type of Diet Ordered:: Renal Do you have support at home?: Yes People in Home [RPT]: spouse Name of Support/Comfort Primary Source: Garlan Juniper, Spouse  Medications Reviewed Today: Medications Reviewed Today     Reviewed by Claudene Crystal, RN (Case Manager) on 08/18/23 at 0932  Med List Status: <None>   Medication Order Taking? Sig Documenting Provider Last Dose Status Informant  acetaminophen  (TYLENOL ) 500 MG tablet [id number] No Take 1,000 mg by mouth every 6 (six) hours as needed for mild pain (pain score 1-3) or headache. [provider] 08/14/2023 Morning Active Self, Pharmacy Records  albuterol  (VENTOLIN  HFA) 108 (90 Base) MCG/ACT inhaler [id number] No Inhale 2 puffs into the lungs every 6 (six) hours as needed for wheezing or shortness of breath. Osborn Blaze, MD Unknown Active Self, Pharmacy Records           Med Note (LEE, NICOLE   Tue Aug 15, 2023  6:33 AM) Rescue Inhaler: Last used 2 months ago  amLODipine  (NORVASC ) 5 MG tablet [id number] No Take 1 tablet (5 mg total) by mouth daily. Cleven Dallas, DO 08/14/2023 Morning Active Self, Pharmacy Records  amoxicillin-clavulanate  (AUGMENTIN) 500-125 MG tablet [id number]  Take 1 tablet by mouth at bedtime for 27 doses. Administer dose after hemodialysis Carleen Chary, DO  Active   aspirin  EC 81 MG tablet [id number] No Take 1 tablet (81 mg total) by mouth daily. Swallow whole. Cleven Dallas, DO 08/14/2023 Evening Active Self, Pharmacy Records  calcitRIOL (ROCALTROL) 0.25 MCG capsule [id number] No Take 0.25 mcg by mouth daily. [provider] 08/14/2023 Morning Active Self, Pharmacy Records  carvedilol  (COREG ) 25 MG tablet [id number] No TAKE 1 TABLET 2 TIMES DAILY Jacqueline Matsu, MD 08/14/2023 Morning Active Self, Pharmacy Records  clopidogrel  (PLAVIX ) 75 MG tablet [id number] No TAKE 1 TABLET(75 MG) BY MOUTH DAILY WITH BREAKFAST Cleven Dallas, DO 08/14/2023 Morning Active Self, Pharmacy Records  furosemide  (LASIX ) 80 MG tablet [id number] No TAKE 1 TABLET BY MOUTH TWICE DAILY Cleven Dallas, DO 08/14/2023 Noon Active Self, Pharmacy Records  gabapentin  (NEURONTIN ) 300 MG capsule [id number] No Take 1 capsule (300 mg total) by mouth at bedtime. Adria Hopkins, MD Past Week Active Self, Pharmacy Records  hydrALAZINE  (APRESOLINE ) 100 MG tablet [id number] No Take 1 tablet (100 mg total) by mouth 3 (three) times daily. Jacqueline Matsu, MD 08/14/2023 Kathie Panther Self, Pharmacy Records  HYDROmorphone  (DILAUDID ) 2 MG tablet [id number]  Take 0.5 tablets (1 mg total) by mouth every 4 (four) hours as needed for up to 5 days for severe pain (pain score 7-10). Driscilla George, MD  Active   isosorbide  mononitrate (IMDUR ) 30 MG 24 hr tablet [id number] No TAKE 3 TABLETS(90 MG) BY MOUTH DAILY Flo Hummingbird, [name] 08/14/2023 Noon Active Self, Pharmacy Records  nitroGLYCERIN  (NITROSTAT ) 0.4 MG SL tablet [id number] No Place 1 tablet (0.4 mg  total) under the tongue every 5 (five) minutes as needed for chest pain. Von Grumbling, [name] Unknown Active Self, Pharmacy Records           Med Note Bevin Bucks, [facility] D   Tue Apr 18, 2023 12:39 PM) Never had to take   rosuvastatin  (CRESTOR ) 10 MG tablet [id number]  Take 1 tablet (10 mg total) by mouth daily. Carleen Chary, DO  Active   Spacer/Aero-Holding Chambers (PRO COMFORT SPACER ADULT) MISC [id number] No Use spacer as needed with inhalers. Cleven Dallas, DO Unknown Active Self, Pharmacy Records  Med List Note Tonja Fray 08/15/23 2536): Dialysis: [facility].             Home Care and Equipment/Supplies: Were Home Health Services Ordered?: NA Any new equipment or medical supplies ordered?: Yes Name of Medical supply agency?: [employer] Were you able to get the equipment/medical supplies?: Yes Do you have any questions related to the use of the equipment/supplies?: No  Functional Questionnaire: Do you need assistance with bathing/showering or dressing?: No Do you need assistance with meal preparation?: No Do you need assistance with eating?: No Do you have difficulty maintaining continence: No Do you need assistance with getting out of bed/getting out of a chair/moving?: No Do you have difficulty managing or taking your medications?: Yes (The spouse manages his medications)  Follow up appointments reviewed: PCP Follow-up appointment confirmed?: NA Specialist Hospital Follow-up appointment confirmed?: Yes Date of Specialist follow-up appointment?: 08/24/23 Follow-Up Specialty Provider:: Surgical Team Do you need transportation to your follow-up appointment?: No Do you understand care options if your condition(s) worsen?: Yes-patient verbalized understanding  SDOH Interventions Today    Flowsheet Row Most Recent Value  SDOH Interventions   Food Insecurity Interventions Intervention Not Indicated  Housing Interventions Intervention Not Indicated  Transportation Interventions Intervention Not Indicated  Utilities Interventions Intervention Not Indicated      Gareld June, BSN, RN [place]  VBCI - Population Health RN Care  Manager [phone]

## 2023-08-18 NOTE — Telephone Encounter (Signed)
 Transition of Care - Initial Contact after Hospitalization  Date of discharge:  08/17/23 Date of contact: 08/18/23  Method: Phone Spoke to: [name]  Patient contacted to discuss transition of care from recent inpatient hospitalization. Patient was admitted to [facility] from 08/14/23 to 08/17/23 with discharge diagnosis of: osteomyelitis   The discharge medication list was reviewed. Patient understands the changes and has no concerns. Taking augmentin at home and vancomycin at dialysis  Patient will return to his/her outpatient HD unit on: today (at HD now)  Reports he is using a scooter to minimize weight bearing. Otherwise getting around well, no PT was ordered at discharge. No other concerns at this time.  [name], [name] 08/18/2023, 1:55 PM  [facility] Pager: [phone]

## 2023-08-19 LAB — CULTURE, BLOOD (ROUTINE X 2)
Culture: NO GROWTH
Culture: NO GROWTH
Special Requests: ADEQUATE

## 2023-08-21 ENCOUNTER — Ambulatory Visit: Payer: Self-pay | Admitting: Internal Medicine

## 2023-08-21 LAB — AEROBIC/ANAEROBIC CULTURE W GRAM STAIN (SURGICAL/DEEP WOUND)

## 2023-08-24 ENCOUNTER — Ambulatory Visit: Payer: Self-pay | Admitting: Podiatry

## 2023-08-24 ENCOUNTER — Encounter: Payer: Self-pay | Admitting: Podiatry

## 2023-08-24 ENCOUNTER — Ambulatory Visit (INDEPENDENT_AMBULATORY_CARE_PROVIDER_SITE_OTHER): Admitting: Podiatry

## 2023-08-24 DIAGNOSIS — M869 Osteomyelitis, unspecified: Secondary | ICD-10-CM

## 2023-08-24 DIAGNOSIS — Z9889 Other specified postprocedural states: Secondary | ICD-10-CM

## 2023-08-24 MED ORDER — OXYCODONE-ACETAMINOPHEN 5-325 MG PO TABS: 1.0000 | ORAL_TABLET | ORAL | 0 refills | Status: AC | PRN

## 2023-08-24 NOTE — Progress Notes (Signed)
  Subjective:  Patient ID: Samuel Gardner, male    DOB: 14-Nov-1970,  MRN: [id number]   DOS: 08/16/2023 Procedure: 1.  Partial first ray amputation, right foot 2.  Application of amniotic graft 5 x 5 cm, right foot  53 y.o. male seen for post op check.  Patient is now approximately 1 week status post above procedures.  He has remained nonweightbearing in a postop shoe.  Left dressing clean dry and intact as instructed.  He is requesting pain medication refill  Review of Systems: Negative except as noted in the HPI. Denies N/V/F/Ch.   Objective:   Constitutional Well developed. Well nourished.  Vascular Foot warm and well perfused. Capillary refill normal to all digits.   No calf pain with palpation  Neurologic Normal speech. Oriented to person, place, and time. Epicritic sensation diminished to right foot  Dermatologic Right foot partial first ray amputation site with some maceration and peeling necrotic normal tissue though underlying appears relatively healthy with no dehiscence of the amputation line and no active drainage.   Orthopedic: Status post right partial first ray amputation   Radiographs: Interval amputation of the first metatarsal at the proximal shaft  Pathology: A. FOOT, RIGHT FIRST RAY, AMPUTATION:  --Gangrenous necrosis of skin and subcutis, involving proximal margin.  - Severe acute osteomyelitis.  - No malignancy identified.   B. BONE, RIGHT PROXIMAL, MARGIN:  - No acute osteomyelitis identified.   Micro: KLEBSIELLA PNEUMONIAE   Assessment:   1. Osteomyelitis of metatarsal (HCC)   2. Post-operative state    Status post right foot partial first ray amputation with amniotic graft application  Plan:  Patient was evaluated and treated and all questions answered.  1 week s/p right partial first ray amp -Progressing well post op, mild maceration however well coapted amputation site.  Will proceed with Betadine wet-to-dry dressing today and believe the  amputation site is fully viable at this time -XR: Expected postoperative changes -WB Status: Nonweightbearing in postop shoe for protection -Sutures: Remain intact 1-2 more -Medications/ABX: Vancomycin  at dialysis - Continue on 4 weeks IV vancomycin  at HD and PO augmentin   -Dressing: Dressing remain clean dry and intact until office follow up next week - F/u Plan: Pt to follow up next week Thursday         [name], DPM [facility] / [facility]

## 2023-08-28 ENCOUNTER — Encounter (HOSPITAL_COMMUNITY): Payer: Self-pay

## 2023-08-31 ENCOUNTER — Ambulatory Visit (INDEPENDENT_AMBULATORY_CARE_PROVIDER_SITE_OTHER): Admitting: Podiatry

## 2023-08-31 DIAGNOSIS — Z9889 Other specified postprocedural states: Secondary | ICD-10-CM

## 2023-08-31 DIAGNOSIS — M869 Osteomyelitis, unspecified: Secondary | ICD-10-CM

## 2023-08-31 NOTE — Progress Notes (Signed)
  Subjective:  Patient ID: Samuel Gardner, male    DOB: 06/18/1970,  MRN: [id number]   DOS: 08/16/2023 Procedure: 1.  Partial first ray amputation, right foot 2.  Application of amniotic graft 5 x 5 cm, right foot  53 y.o. male seen for post op check.  Patient is now approximately 2 week status post above procedures.  He has remained nonweightbearing in a postop shoe using a knee scooter for mobility. dressing clean dry and intact as instructed.  Says pain is decreasing.  Review of Systems: Negative except as noted in the HPI. Denies N/V/F/Ch.   Objective:   Constitutional Well developed. Well nourished.  Vascular Foot warm and well perfused. Capillary refill normal to all digits.   No calf pain with palpation  Neurologic Normal speech. Oriented to person, place, and time. Epicritic sensation diminished to right foot  Dermatologic Right foot partial first ray amputation site with some maceration and peeling necrotic tissue distally and there is a dehiscence at the midportion of the incision line that probes to subcutaneous fat tissue without significant depth or concern for infection   Orthopedic: Status post right partial first ray amputation   Radiographs: Interval amputation of the first metatarsal at the proximal shaft  Pathology: A. FOOT, RIGHT FIRST RAY, AMPUTATION:  --Gangrenous necrosis of skin and subcutis, involving proximal margin.  - Severe acute osteomyelitis.  - No malignancy identified.   B. BONE, RIGHT PROXIMAL, MARGIN:  - No acute osteomyelitis identified.   Micro: KLEBSIELLA PNEUMONIAE   Assessment:   1. Post-operative state   2. Osteomyelitis of metatarsal (HCC)     Status post right foot partial first ray amputation with amniotic graft application  Plan:  Patient was evaluated and treated and all questions answered.  2 week s/p right partial first ray amp -Progressing well post op, dehiscence noted at the midportion of the incision line though  overall still progressing, will continue local wound care as below for dehiscence and monitor for improvement -XR: Expected postoperative changes -WB Status: Nonweightbearing in postop shoe for protection, continue use of knee scooter for mobility -Sutures: Removed staples in total, left sutures intact until next appointment -Medications/ABX: Vancomycin  at dialysis - Continue on 4 weeks IV vancomycin  at HD and PO augmentin   -Dressing: Dressing changed today with Betadine paint to the incision site Steri-Strips and 4 x 4 gauze Kerlix roll and Ace wrap.  Begin Monday Wednesday Friday dressing changes beginning next week - F/u Plan: Pt to follow up in 2 weeks        [name], DPM [facility] / [facility]

## 2023-09-04 ENCOUNTER — Other Ambulatory Visit: Payer: Self-pay | Admitting: Student

## 2023-09-04 DIAGNOSIS — M79604 Pain in right leg: Secondary | ICD-10-CM

## 2023-09-05 ENCOUNTER — Ambulatory Visit: Admitting: Podiatry

## 2023-09-07 ENCOUNTER — Other Ambulatory Visit (HOSPITAL_COMMUNITY): Payer: Self-pay

## 2023-09-11 ENCOUNTER — Encounter

## 2023-09-12 ENCOUNTER — Encounter: Payer: Self-pay | Admitting: Podiatry

## 2023-09-12 ENCOUNTER — Ambulatory Visit (INDEPENDENT_AMBULATORY_CARE_PROVIDER_SITE_OTHER): Admitting: Podiatry

## 2023-09-12 DIAGNOSIS — Z9889 Other specified postprocedural states: Secondary | ICD-10-CM

## 2023-09-12 DIAGNOSIS — M869 Osteomyelitis, unspecified: Secondary | ICD-10-CM

## 2023-09-12 DIAGNOSIS — E11621 Type 2 diabetes mellitus with foot ulcer: Secondary | ICD-10-CM

## 2023-09-12 DIAGNOSIS — L97422 Non-pressure chronic ulcer of left heel and midfoot with fat layer exposed: Secondary | ICD-10-CM

## 2023-09-12 NOTE — Progress Notes (Signed)
  Subjective:  Patient ID: Samuel Gardner, male    DOB: 1970/09/30,  MRN: [id number]   DOS: 08/16/2023 Procedure: 1.  Partial first ray amputation, right foot 2.  Application of amniotic graft 5 x 5 cm, right foot  53 y.o. male seen for post op check.  Patient is now approximately 4 week status post above procedures.  He has remained nonweightbearing in a postop shoe using a knee scooter for mobility.  Has been doing Monday Wednesday Friday dressing changes with Betadine and gauze as recommended.  Review of Systems: Negative except as noted in the HPI. Denies N/V/F/Ch.   Objective:   Constitutional Well developed. Well nourished.  Vascular Foot warm and well perfused. Capillary refill normal to all digits.   No calf pain with palpation  Neurologic Normal speech. Oriented to person, place, and time. Epicritic sensation diminished to right foot  Dermatologic Right foot partial first ray amputation site with some maceration and peeling necrotic tissue distally and there is a dehiscence at the midportion of the incision line that probes to subcutaneous fat tissue without significant depth or concern for infection, bleeding tissue upon debridement   Orthopedic: Status post right partial first ray amputation   Radiographs: Interval amputation of the first metatarsal at the proximal shaft  Pathology: A. FOOT, RIGHT FIRST RAY, AMPUTATION:  --Gangrenous necrosis of skin and subcutis, involving proximal margin.  - Severe acute osteomyelitis.  - No malignancy identified.   B. BONE, RIGHT PROXIMAL, MARGIN:  - No acute osteomyelitis identified.   Micro: KLEBSIELLA PNEUMONIAE   Assessment:   1. Post-operative state   2. Osteomyelitis of metatarsal (HCC)   3. Diabetic ulcer of left midfoot associated with type 2 diabetes mellitus, with fat layer exposed (HCC)      Status post right foot partial first ray amputation with amniotic graft application  Plan:  Patient was evaluated and  treated and all questions answered.  4 week s/p right partial first ray amp -Progressing well post op, overall doing very well continues to improve with local wound care -XR: Expected postoperative changes -WB Status: Nonweightbearing in postop shoe for protection, continue use of knee scooter for mobility -Sutures: Removed sutures today -Medications/ABX:  -Dressing: Continue Monday Wednesday Friday dressing changes beginning next week.  Recommend silver collagen dressing base layer followed by gauze 4 x 4 and Kerlix roll and Ace wrap - F/u Plan: Pt to follow up in 2 weeks for wound check        Marolyn JULIANNA Honour, DPM [facility] / [facility]

## 2023-09-16 ENCOUNTER — Other Ambulatory Visit (HOSPITAL_COMMUNITY): Payer: Self-pay

## 2023-10-03 ENCOUNTER — Ambulatory Visit (INDEPENDENT_AMBULATORY_CARE_PROVIDER_SITE_OTHER): Admitting: Podiatry

## 2023-10-03 ENCOUNTER — Encounter: Payer: Self-pay | Admitting: Podiatry

## 2023-10-03 DIAGNOSIS — Z9889 Other specified postprocedural states: Secondary | ICD-10-CM

## 2023-10-03 DIAGNOSIS — M869 Osteomyelitis, unspecified: Secondary | ICD-10-CM

## 2023-10-03 NOTE — Progress Notes (Signed)
  Subjective:  Patient ID: Samuel Gardner, male    DOB: [birth year],  MRN: [id number]   DOS: 08/16/2023 Procedure: 1.  Partial first ray amputation, right foot 2.  Application of amniotic graft 5 x 5 cm, right foot  53 y.o. male seen for post op check.  Patient is now approximately 6 week status post above procedures.  Patient reports he is doing well he has been doing Betadine dressing changes 3 times a week Monday Wednesday Friday.  Has been using knee scooter to get around.  Wearing postop shoe.  Feels the wound is healing in nicely and has not seen any drainage recently.  Review of Systems: Negative except as noted in the HPI. Denies N/V/F/Ch.   Objective:   Constitutional Well developed. Well nourished.  Vascular Foot warm and well perfused. Capillary refill normal to all digits.   No calf pain with palpation  Neurologic Normal speech. Oriented to person, place, and time. Epicritic sensation diminished to right foot  Dermatologic Right foot partial first ray amputation site is nearly fully healed.  There is a small eschar at the distal aspect of the amputation line which 1 removed yielded a very superficial small 1 cm ulceration.  No active drainage no evidence of dehiscence or residual infection.     Orthopedic: Status post right partial first ray amputation   Radiographs: Interval amputation of the first metatarsal at the proximal shaft  Pathology: A. FOOT, RIGHT FIRST RAY, AMPUTATION:  --Gangrenous necrosis of skin and subcutis, involving proximal margin.  - Severe acute osteomyelitis.  - No malignancy identified.   B. BONE, RIGHT PROXIMAL, MARGIN:  - No acute osteomyelitis identified.   Micro: KLEBSIELLA PNEUMONIAE   Assessment:   1. Osteomyelitis of metatarsal (HCC)   2. Post-operative state   Status post right foot partial first ray amputation with amniotic graft application  Plan:  Patient was evaluated and treated and all questions answered.  6 week s/p  right partial first ray amp -Progressing well post op, overall doing very well continues to improve with local wound care -nearly fully healed aside from very small superficial ulceration of the distal aspect of the amputation site.  The prior surgical cavity is filled in with healthy granular tissue. -XR: Expected postoperative changes -WB Status: Okay to transition back to weightbearing as tolerated in regular shoe gear continue to monitor the area however if there are any changes go back to postop shoe and nonweightbearing -Sutures: Previously removed -Medications/ABX: No further antibiotics indicated -Dressing: Okay to wash right foot with warm soapy water and then dry thoroughly and apply Betadine and Band-Aid dressing.  Do not allow excessive moisture on the distal aspect of the incision - F/u Plan: Pt to follow up in 4 weeks to ensure complete healing        Marolyn JULIANNA Honour, DPM [facility] / [facility]

## 2023-10-04 ENCOUNTER — Other Ambulatory Visit: Payer: Self-pay | Admitting: Student

## 2023-10-04 DIAGNOSIS — M79604 Pain in right leg: Secondary | ICD-10-CM

## 2023-10-06 ENCOUNTER — Emergency Department (HOSPITAL_COMMUNITY)
Admission: EM | Admit: 2023-10-06 | Discharge: 2023-10-06 | Disposition: A | Discharge status: Home / Self Care | Attending: Emergency Medicine | Admitting: Emergency Medicine

## 2023-10-06 ENCOUNTER — Other Ambulatory Visit: Payer: Self-pay

## 2023-10-06 ENCOUNTER — Encounter (HOSPITAL_COMMUNITY): Payer: Self-pay | Admitting: *Deleted

## 2023-10-06 ENCOUNTER — Telehealth: Payer: Self-pay | Admitting: Podiatry

## 2023-10-06 DIAGNOSIS — T148XXA Other injury of unspecified body region, initial encounter: Secondary | ICD-10-CM

## 2023-10-06 DIAGNOSIS — N186 End stage renal disease: Secondary | ICD-10-CM | POA: Insufficient documentation

## 2023-10-06 DIAGNOSIS — E1122 Type 2 diabetes mellitus with diabetic chronic kidney disease: Secondary | ICD-10-CM | POA: Diagnosis not present

## 2023-10-06 DIAGNOSIS — Z7902 Long term (current) use of antithrombotics/antiplatelets: Secondary | ICD-10-CM | POA: Diagnosis not present

## 2023-10-06 DIAGNOSIS — I509 Heart failure, unspecified: Secondary | ICD-10-CM | POA: Insufficient documentation

## 2023-10-06 DIAGNOSIS — Z992 Dependence on renal dialysis: Secondary | ICD-10-CM | POA: Diagnosis not present

## 2023-10-06 DIAGNOSIS — S90822A Blister (nonthermal), left foot, initial encounter: Secondary | ICD-10-CM | POA: Insufficient documentation

## 2023-10-06 DIAGNOSIS — X58XXXA Exposure to other specified factors, initial encounter: Secondary | ICD-10-CM | POA: Diagnosis not present

## 2023-10-06 DIAGNOSIS — Z7982 Long term (current) use of aspirin: Secondary | ICD-10-CM | POA: Insufficient documentation

## 2023-10-06 NOTE — Telephone Encounter (Signed)
 Patient is on the way to dialysis. Is it okay to go to emergency room after?

## 2023-10-06 NOTE — Discharge Instructions (Signed)
 Continue with wound care.  Keep area clean dry and covered.  Apply Neosporin twice daily to area.  Call podiatry on Monday to schedule follow-up.  Return to ER with new or worsening symptoms.

## 2023-10-06 NOTE — ED Provider Notes (Signed)
 [place] EMERGENCY DEPARTMENT AT [facility] Provider Note   CSN: [id number] Arrival date & time: 10/06/23  1702     Patient presents with: Foot Problem (Left)   Samuel Gardner is a 53 y.o. male.  And is end-stage renal disease on dialysis, heart failure, diabetes presents to emergency room with complaint of blister near left heel.  It started 3 days ago and he has been seen by podiatry for this.  The blister did pop and drain serosanguineous appearing fluid.  No acute change in symptoms, no fever no surrounding cellulitis, no purulent drainage.  Have been using blister Band-Aids, Epsom salt soaks and keeping close eye on things.  They will follow-up with podiatry for this.   HPI     Prior to Admission medications   Medication Sig Start Date End Date Taking? Authorizing Provider  acetaminophen  (TYLENOL ) 500 MG tablet Take 1,000 mg by mouth every 6 (six) hours as needed for mild pain (pain score 1-3) or headache.    [provider]  albuterol  (VENTOLIN  HFA) 108 (90 Base) MCG/ACT inhaler Inhale 2 puffs into the lungs every 6 (six) hours as needed for wheezing or shortness of breath. 02/02/22   [name], [name], MD  amLODipine  (NORVASC ) 5 MG tablet Take 1 tablet (5 mg total) by mouth daily. 05/04/23   [name], DO  aspirin  EC 81 MG tablet Take 1 tablet (81 mg total) by mouth daily. Swallow whole. 08/18/23   [name], DO  calcitRIOL (ROCALTROL) 0.25 MCG capsule Take 0.25 mcg by mouth daily. 06/19/23   [provider]  carvedilol  (COREG ) 25 MG tablet TAKE 1 TABLET 2 TIMES DAILY 03/17/23   [name], MD  clopidogrel  (PLAVIX ) 75 MG tablet TAKE 1 TABLET(75 MG) BY MOUTH DAILY WITH BREAKFAST 08/10/23   [name], DO  furosemide  (LASIX ) 80 MG tablet TAKE 1 TABLET BY MOUTH TWICE DAILY 07/10/23   [name], DO  gabapentin  (NEURONTIN ) 300 MG capsule TAKE 1 CAPSULE BY MOUTH AT BEDTIME 10/05/23   [name], DO  hydrALAZINE  (APRESOLINE ) 100 MG  tablet Take 1 tablet (100 mg total) by mouth 3 (three) times daily. 11/09/22   [name], MD  isosorbide  mononitrate (IMDUR ) 30 MG 24 hr tablet TAKE 3 TABLETS(90 MG) BY MOUTH DAILY 06/16/23   Parthenia Olivia HERO, [name]  nitroGLYCERIN  (NITROSTAT ) 0.4 MG SL tablet Place 1 tablet (0.4 mg total) under the tongue every 5 (five) minutes as needed for chest pain. 02/07/22   Lucien Orren SAILOR, [name]  rosuvastatin  (CRESTOR ) 10 MG tablet Take 1 tablet (10 mg total) by mouth daily. 08/18/23   Harrie Bruckner, DO  Spacer/Aero-Holding Chambers (PRO COMFORT SPACER ADULT) MISC Use spacer as needed with inhalers. 05/03/23   [name], DO    Allergies: Atorvastatin     Review of Systems  Skin:  Positive for wound.    Updated Vital Signs BP 120/64 (BP Location: Right Arm)   Pulse (!) 59   Temp 98.6 F (37 C) (Oral)   Resp 16   Ht 5' 7 (1.702 m)   Wt 79.4 kg   SpO2 98%   BMI 27.42 kg/m   Physical Exam Vitals and nursing note reviewed.  Constitutional:      General: He is not in acute distress.    Appearance: He is not toxic-appearing.  HENT:     Head: Normocephalic and atraumatic.  Eyes:     General: No scleral icterus.    Conjunctiva/sclera: Conjunctivae normal.  Cardiovascular:  Rate and Rhythm: Normal rate and regular rhythm.     Pulses: Normal pulses.     Heart sounds: Normal heart sounds.  Pulmonary:     Effort: Pulmonary effort is normal. No respiratory distress.     Breath sounds: Normal breath sounds.  Abdominal:     General: Abdomen is flat. Bowel sounds are normal.     Palpations: Abdomen is soft.     Tenderness: There is no abdominal tenderness.  Skin:    General: Skin is warm and dry.     Findings: No lesion.     Comments: Patient has a small blister to the left side of his heel.  No purulent drainage.  No surrounding erythema.  Strong dorsal pedal pulse, good cap refill.  Neurological:     General: No focal deficit present.     Mental Status: He is alert and  oriented to person, place, and time. Mental status is at baseline.     (all labs ordered are listed, but only abnormal results are displayed) Labs Reviewed - No data to display  EKG: None  Radiology: No results found.   Procedures   Medications Ordered in the ED - No data to display                                  Medical Decision Making  This patient presents to the ED for concern of foot pain, this involves an extensive number of treatment options, and is a complaint that carries with it a high risk of complications and morbidity.  The differential diagnosis includes osteomyelitis, DVT, ulcer, abscess, blister, cellulitis   Problem List / ED Course / Critical interventions / Medication management  Patient has had approximately 1 cm x 1 cm blister near her left heel for about 3 days.  Today when they were doing routine wound care they noticed spontaneous drainage from the blister.  They note clear blood-tinged fluid with no color or foul smell.  On my exam he does have a small blister that appears to have drained.  He has no surrounding erythema, abscess or ulceration.  He is neurovascularly intact.  He has no fever and no sign of systemic illness.  Feel continuing wound care, following up with podiatry and Neosporin twice daily with close follow-up as appropriate.  I have reviewed the patients home medicines and have made adjustments as needed   Plan F/u w/ PCP in 2-3d to ensure resolution of sx.  Patient was given return precautions. Patient stable for discharge at this time.  Patient educated on sx/dx and verbalized understanding of plan. Return to ER w/ new or worsening sx.       Final diagnoses:  Blister    ED Discharge Orders     None          Shermon Warren SAILOR, [name] 10/06/23 1933    Emil Share, DO 10/11/23 1416

## 2023-10-06 NOTE — Telephone Encounter (Signed)
 Blister looks bruised through the skin, and it's Pinkish red color and Leaking. She is concerned he may need antibiotic or appointment. What should she do?

## 2023-10-06 NOTE — ED Triage Notes (Signed)
 Pt to ED c/o left foot wound near heel, noticed it for 3 days. Reports contacted podiatry, unable to see today, recommended to come to ER.  HX DM, dialysis pt. MWF, last treatment today.

## 2023-10-09 NOTE — Telephone Encounter (Signed)
 Patient was seen in the ER no signs of infection is continuing care at home will be seen in office in Aug.Patient advised if any changes any signs of infection or changes in effected area call our office for appointment.

## 2023-10-12 ENCOUNTER — Other Ambulatory Visit: Payer: Self-pay | Admitting: Student

## 2023-10-12 DIAGNOSIS — M79604 Pain in right leg: Secondary | ICD-10-CM

## 2023-10-27 ENCOUNTER — Other Ambulatory Visit: Payer: Self-pay

## 2023-10-27 MED ORDER — ROSUVASTATIN CALCIUM 10 MG PO TABS: 10.0000 mg | ORAL_TABLET | Freq: Every day | ORAL | 1 refills | Status: DC

## 2023-10-27 NOTE — Telephone Encounter (Signed)
 Pharmacy is requesting a 90 day supply.  Medication sent to pharmacy.

## 2023-10-31 ENCOUNTER — Encounter: Payer: Self-pay | Admitting: Podiatry

## 2023-10-31 ENCOUNTER — Ambulatory Visit (INDEPENDENT_AMBULATORY_CARE_PROVIDER_SITE_OTHER): Admitting: Podiatry

## 2023-10-31 ENCOUNTER — Ambulatory Visit: Admitting: Student

## 2023-10-31 VITALS — BP 150/73 | HR 56 | Temp 97.5°F

## 2023-10-31 VITALS — BP 149/84 | HR 59 | Temp 98.5°F | Ht 67.0 in | Wt 186.4 lb

## 2023-10-31 DIAGNOSIS — Z9889 Other specified postprocedural states: Secondary | ICD-10-CM

## 2023-10-31 DIAGNOSIS — Z1159 Encounter for screening for other viral diseases: Secondary | ICD-10-CM

## 2023-10-31 DIAGNOSIS — I10 Essential (primary) hypertension: Secondary | ICD-10-CM

## 2023-10-31 DIAGNOSIS — E785 Hyperlipidemia, unspecified: Secondary | ICD-10-CM

## 2023-10-31 DIAGNOSIS — Z0000 Encounter for general adult medical examination without abnormal findings: Secondary | ICD-10-CM

## 2023-10-31 DIAGNOSIS — I739 Peripheral vascular disease, unspecified: Secondary | ICD-10-CM

## 2023-10-31 DIAGNOSIS — Z992 Dependence on renal dialysis: Secondary | ICD-10-CM

## 2023-10-31 DIAGNOSIS — N186 End stage renal disease: Secondary | ICD-10-CM | POA: Diagnosis not present

## 2023-10-31 DIAGNOSIS — I120 Hypertensive chronic kidney disease with stage 5 chronic kidney disease or end stage renal disease: Secondary | ICD-10-CM | POA: Diagnosis not present

## 2023-10-31 DIAGNOSIS — E1122 Type 2 diabetes mellitus with diabetic chronic kidney disease: Secondary | ICD-10-CM | POA: Diagnosis not present

## 2023-10-31 DIAGNOSIS — M869 Osteomyelitis, unspecified: Secondary | ICD-10-CM

## 2023-10-31 LAB — POCT GLYCOSYLATED HEMOGLOBIN (HGB A1C): HbA1c, POC (controlled diabetic range): 6.7 % (ref 0.0–7.0)

## 2023-10-31 LAB — GLUCOSE, CAPILLARY: Glucose-Capillary: 111 mg/dL — ABNORMAL HIGH (ref 70–99)

## 2023-10-31 MED ORDER — HYDRALAZINE HCL 100 MG PO TABS: 100.0000 mg | ORAL_TABLET | Freq: Three times a day (TID) | ORAL | 3 refills | Status: AC

## 2023-10-31 MED ORDER — CLOPIDOGREL BISULFATE 75 MG PO TABS
75.0000 mg | ORAL_TABLET | Freq: Every day | ORAL | 3 refills | Status: AC
Start: 2023-10-31 — End: ?

## 2023-10-31 MED ORDER — ROSUVASTATIN CALCIUM 10 MG PO TABS: 10.0000 mg | ORAL_TABLET | Freq: Every day | ORAL | 3 refills | Status: AC

## 2023-10-31 NOTE — Progress Notes (Unsigned)
 CC: Follow-up  HPI:  Samuel Gardner is a 53 y.o. male living with a history stated below and presents today for follow-up. Please see problem based assessment and plan for additional details.  Past Medical History:  Diagnosis Date   (HFpEF) heart failure with preserved ejection fraction (HCC)    Ascending aorta dilatation (HCC)    40mm by echo 11/2022   Diabetes mellitus without complication (HCC)    ESRD on hemodialysis (HCC)    Hypertension    Mitral regurgitation    mild by echo 11/2022   Normocytic anemia     Current Outpatient Medications on File Prior to Visit  Medication Sig Dispense Refill   acetaminophen  (TYLENOL ) 500 MG tablet Take 1,000 mg by mouth every 6 (six) hours as needed for mild pain (pain score 1-3) or headache.     albuterol  (VENTOLIN  HFA) 108 (90 Base) MCG/ACT inhaler Inhale 2 puffs into the lungs every 6 (six) hours as needed for wheezing or shortness of breath. 6.7 g 2   amLODipine  (NORVASC ) 5 MG tablet Take 1 tablet (5 mg total) by mouth daily. 90 tablet 3   aspirin  EC 81 MG tablet Take 1 tablet (81 mg total) by mouth daily. Swallow whole. 90 tablet 0   carvedilol  (COREG ) 25 MG tablet TAKE 1 TABLET 2 TIMES DAILY 60 tablet 9   furosemide  (LASIX ) 80 MG tablet TAKE 1 TABLET BY MOUTH TWICE DAILY 218 tablet 1   gabapentin  (NEURONTIN ) 300 MG capsule TAKE 1 CAPSULE BY MOUTH AT BEDTIME 30 capsule 2   isosorbide  mononitrate (IMDUR ) 30 MG 24 hr tablet TAKE 3 TABLETS(90 MG) BY MOUTH DAILY 270 tablet 3   nitroGLYCERIN  (NITROSTAT ) 0.4 MG SL tablet Place 1 tablet (0.4 mg total) under the tongue every 5 (five) minutes as needed for chest pain. 25 tablet 3   Spacer/Aero-Holding Chambers (PRO COMFORT SPACER ADULT) MISC Use spacer as needed with inhalers. 1 each 0   No current facility-administered medications on file prior to visit.    Family History  Adopted: Yes  Problem Relation Age of Onset   Diabetes Mother    Hypertension Mother    Kidney disease Mother     Cancer Father    Diabetes Sister    Hypertension Sister     Social History   Socioeconomic History   Marital status: Married    Spouse name: Not on file   Number of children: Not on file   Years of education: Not on file   Highest education level: Not on file  Occupational History   Not on file  Tobacco Use   Smoking status: Former    Current packs/day: 0.00    Types: Cigarettes    Quit date: 01/30/2022    Years since quitting: 1.7   Smokeless tobacco: Never  Vaping Use   Vaping status: Never Used  Substance and Sexual Activity   Alcohol use: Not Currently   Drug use: No   Sexual activity: Not on file  Other Topics Concern   Not on file  Social History Narrative   Not on file   Social Drivers of Health   Financial Resource Strain: Not on file  Food Insecurity: No Food Insecurity (08/18/2023)   Hunger Vital Sign    Worried About Running Out of Food in the Last Year: Never true    Ran Out of Food in the Last Year: Never true  Transportation Needs: No Transportation Needs (08/18/2023)   PRAPARE - Transportation  Lack of Transportation (Medical): No    Lack of Transportation (Non-Medical): No  Physical Activity: Not on file  Stress: Not on file  Social Connections: Moderately Isolated (02/23/2022)   Social Connection and Isolation Panel    Frequency of Communication with Friends and Family: More than three times a week    Frequency of Social Gatherings with Friends and Family: More than three times a week    Attends Religious Services: Never    Database administrator or Organizations: No    Attends Banker Meetings: Never    Marital Status: Married  Catering manager Violence: Not At Risk (08/18/2023)   Humiliation, Afraid, Rape, and Kick questionnaire    Fear of Current or Ex-Partner: No    Emotionally Abused: No    Physically Abused: No    Sexually Abused: No    Review of Systems: ROS negative except for what is noted on the assessment and  plan.  Vitals:   10/31/23 1056 10/31/23 1058  BP: (!) 157/83 (!) 149/84  Pulse: (!) 59 (!) 59  Temp: 98.5 F (36.9 C)   TempSrc: Oral   SpO2: 94%   Weight: 186 lb 6.4 oz (84.6 kg)   Height: 5' 7 (1.702 m)     Physical Exam: Constitutional: well-appearing, sitting in chair, in no acute distress Cardiovascular: regular rate and rhythm, no m/r/g, DP pulses 2+, no LEE Pulmonary/Chest: normal work of breathing on room air, lungs clear to auscultation bilaterally Abdominal: soft, non-tender, non-distended MSK: normal bulk and tone Skin: warm and dry Psych: normal mood and behavior  Assessment & Plan:     Patient discussed with Dr. Shawn  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) A1c today is 6.7.  Managed with lifestyle modifications.  It was 6.1 approximately 1 year ago.  He is unsurprised by increase today as his appetite has come back over the last few months and he has been eating a lot of pasta.  Counseled on dietary approaches for diabetes and he will continue to work on lifestyle modifications including reducing carbohydrate intake specifically.  Referral sent for ophthalmology.  PAD (peripheral artery disease) (HCC) Follows with vascular surgery.  Denies claudication.  Continue Plavix  and ASA and follow-up with vascular 11/6 with plan to repeat ABIs at that time.  Essential hypertension BP is 157/83 and 149/84.  Will begin working on recording more ambulatory readings in addition to lifestyle modifications.  For now continue Norvasc  5 mg daily, Coreg  25 mg daily, furosemide  80 mg twice daily, hydralazine  100 mg 3 times daily, Imdur  30 mg daily.  If BP continually elevated at follow-up and with at home readings, consider increasing amlodipine  to 10 mg daily.  ESRD on dialysis (HCC) Compliant with MWF schedule.  No signs of hypervolemia today.  Labs have been stable.  Healthcare maintenance HCV screening today.   Norman Lobstein, D.O. [facility]  Internal Medicine, PGY-2 Phone: [phone] Date 11/01/2023 Time 1:19 PM

## 2023-10-31 NOTE — Progress Notes (Signed)
  Subjective:  Patient ID: Samuel Gardner, male    DOB: 1970/04/01,  MRN: [id number]   DOS: 08/16/2023 Procedure: 1.  Partial first ray amputation, right foot 2.  Application of amniotic graft 5 x 5 cm, right foot  53 y.o. male seen for post op check.  Patient is now approximately 10 week status post above procedures.  Patient reports he has been doing well in regards to the right foot nearly fully healed there.  On the left foot there is a new wound on the lateral aspect of the heel that popped up recently.  Has had some peeling skin around the area.  Has been putting Betadine Band-Aid on the area  Review of Systems: Negative except as noted in the HPI. Denies N/V/F/Ch.   Objective:   Constitutional Well developed. Well nourished.  Vascular Foot warm and well perfused. Capillary refill normal to all digits.   No calf pain with palpation  Neurologic Normal speech. Oriented to person, place, and time. Epicritic sensation diminished to right foot  Dermatologic Right foot partial first ray amputation site nearly fully healed tiny 2 x 2 mm  superficial wound at the distal aspect of the amputation site nearly fully healed.  No evidence of infection  Left foot with superficial ulceration of the lateral aspect of the left heel with some maceration surrounding and peeling skin which was debrided no malodor no erythema or drainage.   Orthopedic: Status post right partial first ray amputation   Radiographs: Interval amputation of the first metatarsal at the proximal shaft  Pathology: A. FOOT, RIGHT FIRST RAY, AMPUTATION:  --Gangrenous necrosis of skin and subcutis, involving proximal margin.  - Severe acute osteomyelitis.  - No malignancy identified.   B. BONE, RIGHT PROXIMAL, MARGIN:  - No acute osteomyelitis identified.   Micro: KLEBSIELLA PNEUMONIAE   Assessment:   1. Osteomyelitis of metatarsal (HCC)   2. Post-operative state    Status post right foot partial first ray  amputation with amniotic graft application  Plan:  Patient was evaluated and treated and all questions answered.  10 week s/p right partial first ray amp -Progressing well post op, right foot amputation site nearly fully healed.  Continue with Betadine and Band-Aid dressing until fully healed -XR: Expected postoperative changes -WB Status: WBAT in regular shoe -Sutures: Previously removed -Medications/ABX: No further antibiotics indicated -Dressing: Betadine and Band-Aid as above - F/u Plan: 3 weeks  # Left heel ulceration new superficial lateral aspect - Small superficial ulceration on the lateral aspect.  Recommend we proceed with local wound care including Betadine and dry Band-Aid no Neosporin or anything that would overly moisten the area - Monitor for any worsening infection no evidence of infection today no indication for antibiotics If he develops any fever chills nausea vomiting or sees worsening changes in the wound call the office and or come in sooner or go to the ED if develops fever        Marolyn JULIANNA Honour, DPM [facility] / [facility]

## 2023-11-01 LAB — HCV INTERPRETATION

## 2023-11-01 LAB — HCV AB W REFLEX TO QUANT PCR: HCV Ab: NONREACTIVE

## 2023-11-01 NOTE — Assessment & Plan Note (Addendum)
 A1c today is 6.7.  Managed with lifestyle modifications.  It was 6.1 approximately 1 year ago.  He is unsurprised by increase today as his appetite has come back over the last few months and he has been eating a lot of pasta.  Counseled on dietary approaches for diabetes and he will continue to work on lifestyle modifications including reducing carbohydrate intake specifically.  Referral sent for ophthalmology.

## 2023-11-01 NOTE — Assessment & Plan Note (Signed)
 Compliant with MWF schedule.  No signs of hypervolemia today.  Labs have been stable.

## 2023-11-01 NOTE — Addendum Note (Signed)
 Addended by: SHAWN SICK on: 11/01/2023 02:01 PM   Modules accepted: Level of Service

## 2023-11-01 NOTE — Assessment & Plan Note (Signed)
 Follows with vascular surgery.  Denies claudication.  Continue Plavix  and ASA and follow-up with vascular 11/6 with plan to repeat ABIs at that time.

## 2023-11-01 NOTE — Assessment & Plan Note (Signed)
 BP is 157/83 and 149/84.  Will begin working on recording more ambulatory readings in addition to lifestyle modifications.  For now continue Norvasc  5 mg daily, Coreg  25 mg daily, furosemide  80 mg twice daily, hydralazine  100 mg 3 times daily, Imdur  30 mg daily.  If BP continually elevated at follow-up and with at home readings, consider increasing amlodipine  to 10 mg daily.

## 2023-11-01 NOTE — Assessment & Plan Note (Signed)
-   HCV screening today

## 2023-11-02 ENCOUNTER — Encounter: Payer: Self-pay | Admitting: Student

## 2023-11-02 ENCOUNTER — Ambulatory Visit: Payer: Self-pay | Admitting: Student

## 2023-11-20 ENCOUNTER — Other Ambulatory Visit: Payer: Self-pay | Admitting: Student

## 2023-11-21 ENCOUNTER — Ambulatory Visit (INDEPENDENT_AMBULATORY_CARE_PROVIDER_SITE_OTHER): Admitting: Podiatry

## 2023-11-21 DIAGNOSIS — E11621 Type 2 diabetes mellitus with foot ulcer: Secondary | ICD-10-CM | POA: Diagnosis not present

## 2023-11-21 DIAGNOSIS — M869 Osteomyelitis, unspecified: Secondary | ICD-10-CM | POA: Diagnosis not present

## 2023-11-21 DIAGNOSIS — Z9889 Other specified postprocedural states: Secondary | ICD-10-CM | POA: Diagnosis not present

## 2023-11-21 DIAGNOSIS — L97422 Non-pressure chronic ulcer of left heel and midfoot with fat layer exposed: Secondary | ICD-10-CM

## 2023-11-21 NOTE — Progress Notes (Signed)
  Subjective:  Patient ID: Samuel Gardner, male    DOB: [birth year],  MRN: [id number]   DOS: 08/16/2023 Procedure: 1.  Partial first ray amputation, right foot 2.  Application of amniotic graft 5 x 5 cm, right foot  53 y.o. male seen for post op check.  Patient is now approximately 13 week status post above procedures.   Patient reports he is doing well wounds are now fully healed on both the right foot amputation site as well as the left heel.  He denies drainage from either 1.  He is putting Band-Aids over them to protect them.  Review of Systems: Negative except as noted in the HPI. Denies N/V/F/Ch.   Objective:   Constitutional Well developed. Well nourished.  Vascular Foot warm and well perfused. Capillary refill normal to all digits.   No calf pain with palpation  Neurologic Normal speech. Oriented to person, place, and time. Epicritic sensation diminished to right foot  Dermatologic Right foot partial first ray amputation site now fully healed no open wound or evidence of infection  Left foot with small piece of eschar overlying the left lateral heel ulceration which is fully healed        Orthopedic: Status post right partial first ray amputation   Radiographs: Interval amputation of the first metatarsal at the proximal shaft  Pathology: A. FOOT, RIGHT FIRST RAY, AMPUTATION:  --Gangrenous necrosis of skin and subcutis, involving proximal margin.  - Severe acute osteomyelitis.  - No malignancy identified.   B. BONE, RIGHT PROXIMAL, MARGIN:  - No acute osteomyelitis identified.   Micro: KLEBSIELLA PNEUMONIAE   Assessment:   1. Osteomyelitis of metatarsal (HCC)   2. Diabetic ulcer of left midfoot associated with type 2 diabetes mellitus, with fat layer exposed (HCC)   3. Post-operative state     Status post right foot partial first ray amputation with amniotic graft application  Plan:  Patient was evaluated and treated and all questions answered.  13 week  s/p right partial first ray amp -Progressing well post op, right foot amputation site is fully healed at this time no further wound care required. -Recommend ongoing monitoring for any ulceration drainage or swelling. -XR: Expected postoperative changes -WB Status: WBAT in regular shoe -recommend he set up an appoint with our pedorthist Trish for custom inserts -Sutures: Previously removed -Medications/ABX: No further antibiotics indicated  # Left heel ulceration new superficial lateral aspect - Fully healed at this time he can use lotion to keep the area moisturized and free of cracked peeling skin - Monitor for any worsening ulceration or infection -If he notices any wounds develop or worsen on the left heel or elsewhere recommended he call the office we will get him in for appointment otherwise follow-up in 6 months for checkup        Marolyn JULIANNA Honour, DPM [facility] / [facility]

## 2023-11-21 NOTE — Progress Notes (Signed)
 Internal Medicine Clinic Attending  Case discussed with the resident at the time of the visit.  We reviewed the resident's history and exam and pertinent patient test results.  I agree with the assessment, diagnosis, and plan of care documented in the resident's note.

## 2023-12-14 ENCOUNTER — Ambulatory Visit

## 2023-12-14 DIAGNOSIS — L97511 Non-pressure chronic ulcer of other part of right foot limited to breakdown of skin: Secondary | ICD-10-CM

## 2023-12-14 DIAGNOSIS — E11621 Type 2 diabetes mellitus with foot ulcer: Secondary | ICD-10-CM

## 2023-12-14 DIAGNOSIS — L97422 Non-pressure chronic ulcer of left heel and midfoot with fat layer exposed: Secondary | ICD-10-CM

## 2023-12-14 DIAGNOSIS — M2142 Flat foot [pes planus] (acquired), left foot: Secondary | ICD-10-CM

## 2023-12-14 DIAGNOSIS — M2141 Flat foot [pes planus] (acquired), right foot: Secondary | ICD-10-CM

## 2023-12-14 NOTE — Progress Notes (Signed)
 Orthotics   Patient was present and evaluated for Custom molded foot orthotics. Patient will benefit from CFO's to provide total contact to BIL MLA's helping to balance and distribute body weight more evenly across BIL feet helping to reduce plantar pressure and pain. Orthotic will also encourage FF / RF alignment  Patient was scanned today and will return for fitting upon receipt  Deductible and oop max met for the year   Patient did not want toefiller. Adding plastizote top cover to greater reduce sheer, pressure and friction to help prevent likelihood of new ulceration   Lolita Schultze CPed, CFo, CFm

## 2024-01-02 ENCOUNTER — Telehealth: Payer: Self-pay

## 2024-01-02 NOTE — Telephone Encounter (Signed)
 Orthotics are here Balance $542.00 Financial form SIGNED AND ON FILE Appt set for 10/23

## 2024-01-04 ENCOUNTER — Ambulatory Visit (INDEPENDENT_AMBULATORY_CARE_PROVIDER_SITE_OTHER)

## 2024-01-04 DIAGNOSIS — M21961 Unspecified acquired deformity of right lower leg: Secondary | ICD-10-CM | POA: Diagnosis not present

## 2024-01-04 DIAGNOSIS — L97511 Non-pressure chronic ulcer of other part of right foot limited to breakdown of skin: Secondary | ICD-10-CM

## 2024-01-04 DIAGNOSIS — Z9889 Other specified postprocedural states: Secondary | ICD-10-CM | POA: Diagnosis not present

## 2024-01-04 DIAGNOSIS — M21962 Unspecified acquired deformity of left lower leg: Secondary | ICD-10-CM | POA: Diagnosis not present

## 2024-01-04 DIAGNOSIS — M869 Osteomyelitis, unspecified: Secondary | ICD-10-CM | POA: Diagnosis not present

## 2024-01-04 DIAGNOSIS — E11621 Type 2 diabetes mellitus with foot ulcer: Secondary | ICD-10-CM | POA: Diagnosis not present

## 2024-01-04 NOTE — Progress Notes (Signed)
 Removed charges as DX driven pes planus not a covered DX  Adding additional DX on DOS of fitting [id number] Lolita Schultze Cped, CFo, CFm

## 2024-01-04 NOTE — Progress Notes (Signed)
 Patient presents today to pick up custom molded foot orthotics, diagnosed with BIL foot deformity by Dr. Malvin with Great toe amputation as well .   Orthotics were dispensed and fit was satisfactory. Reviewed instructions for break-in and wear. Written instructions given to patient.  Patient will follow up as needed.   Lolita Schultze Cped, CFo, CFm

## 2024-01-17 NOTE — Progress Notes (Unsigned)
 HISTORY AND PHYSICAL     CC:  follow up. Requesting Provider:  [name], DO  HPI: This is a 53 y.o. male who is here today for follow up for PAD.  Pt has hx of aortogram LLE via right CFA with balloon lithotripsy left TPT, peroneal artery and PTA 04/19/2023 by Dr. Lanis.  He subsequently underwent aortogram RLE with balloon angioplasty right SFA, drug coated balloon angioplasty right SFA via left CFA 06/07/2023.  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 Pt has hx of ESRD with creation of left BC AVF on 12/13/2022 by Dr. Lanis.   On exam today, it Samuel Gardner was doing well, accompanied by his wife.  He states that he is feeling good.  Denies symptoms of claudication, ischemic rest pain, tissue loss.  Continues to be compliant with aspirin , Plavix , statin.  He is a non-smoker.  Wounds have healed since his last visit  He is on HD M/W/F at [facility] location.  He states his fistula is working well.    The pt is on a statin for cholesterol management.    The pt is on an aspirin .    Other AC:  Plavix  The pt is on BB, CCB, hydralazine  for hypertension.  The pt is  on medication for diabetes. Tobacco hx:  former    Past Medical History:  Diagnosis Date   (HFpEF) heart failure with preserved ejection fraction (HCC)    Ascending aorta dilatation    40mm by echo 11/2022   Diabetes mellitus without complication (HCC)    ESRD on hemodialysis (HCC)    Hypertension    Mitral regurgitation    mild by echo 11/2022   Normocytic anemia     Past Surgical History:  Procedure Laterality Date   ABDOMINAL AORTOGRAM N/A 06/07/2023   Procedure: ABDOMINAL AORTOGRAM;  Surgeon: [name], MD;  Location: [facility] INVASIVE CV LAB;  Service: Cardiovascular;  Laterality: N/A;   ABDOMINAL AORTOGRAM W/LOWER EXTREMITY Bilateral 04/19/2023   Procedure: ABDOMINAL AORTOGRAM W/LOWER EXTREMITY;  Surgeon: [name], MD;  Location: [facility] INVASIVE CV LAB;  Service: Cardiovascular;  Laterality:  Bilateral;   AMPUTATION Right 08/16/2023   Procedure: AMPUTATION, FOOT, RAY;  Surgeon: Malvin Marsa FALCON, DPM;  Location: [facility] OR;  Service: Orthopedics/Podiatry;  Laterality: Right;  Right partial first ray amputation   AV FISTULA PLACEMENT Left 12/13/2022   Procedure: LEFT ARM BRACHIOCEPHALIC ARTERIOVENOUS (AV) FISTULA CREATION;  Surgeon: [name], MD;  Location: [facility] OR;  Service: Vascular;  Laterality: Left;   COLONOSCOPY WITH PROPOFOL  N/A 04/11/2023   Procedure: COLONOSCOPY WITH PROPOFOL ;  Surgeon: [name], MD;  Location: [facility] ENDOSCOPY;  Service: Gastroenterology;  Laterality: N/A;   LEG SURGERY Right    teenager   LOWER EXTREMITY ANGIOGRAPHY N/A 06/07/2023   Procedure: Lower Extremity Angiography;  Surgeon: [name], MD;  Location: [facility] INVASIVE CV LAB;  Service: Cardiovascular;  Laterality: N/A;   LOWER EXTREMITY INTERVENTION  06/07/2023   Procedure: LOWER EXTREMITY INTERVENTION;  Surgeon: [name], MD;  Location: [facility] INVASIVE CV LAB;  Service: Cardiovascular;;   PERIPHERAL INTRAVASCULAR LITHOTRIPSY Left 04/19/2023   Procedure: PERIPHERAL INTRAVASCULAR LITHOTRIPSY;  Surgeon: [name], MD;  Location: [facility] INVASIVE CV LAB;  Service: Cardiovascular;  Laterality: Left;   PERIPHERAL VASCULAR BALLOON ANGIOPLASTY Left 04/19/2023   Procedure: PERIPHERAL VASCULAR BALLOON ANGIOPLASTY;  Surgeon: [name], MD;  Location: [facility] INVASIVE CV LAB;  Service: Cardiovascular;  Laterality: Left;   POLYPECTOMY  04/11/2023   Procedure:  POLYPECTOMY;  Surgeon: [name], MD;  Location: [facility];  Service: Gastroenterology;;    Allergies  Allergen Reactions   Atorvastatin      Muscle cramping    Current Outpatient Medications  Medication Sig Dispense Refill   acetaminophen  (TYLENOL ) 500 MG tablet Take 1,000 mg by mouth every 6 (six) hours as needed for mild pain (pain score 1-3) or headache.     albuterol  (VENTOLIN  HFA) 108 (90 Base) MCG/ACT inhaler Inhale 2  puffs into the lungs every 6 (six) hours as needed for wheezing or shortness of breath. 6.7 g 2   amLODipine  (NORVASC ) 5 MG tablet Take 1 tablet (5 mg total) by mouth daily. 90 tablet 3   ASPIRIN  LOW DOSE 81 MG tablet TAKE 1 TABLET(81 MG) BY MOUTH DAILY. SWALLOW WHOLE 90 tablet 0   carvedilol  (COREG ) 25 MG tablet TAKE 1 TABLET 2 TIMES DAILY 60 tablet 9   clopidogrel  (PLAVIX ) 75 MG tablet Take 1 tablet (75 mg total) by mouth daily. 90 tablet 3   furosemide  (LASIX ) 80 MG tablet TAKE 1 TABLET BY MOUTH TWICE DAILY 218 tablet 1   gabapentin  (NEURONTIN ) 300 MG capsule TAKE 1 CAPSULE BY MOUTH AT BEDTIME 30 capsule 2   hydrALAZINE  (APRESOLINE ) 100 MG tablet Take 1 tablet (100 mg total) by mouth 3 (three) times daily. 270 tablet 3   isosorbide  mononitrate (IMDUR ) 30 MG 24 hr tablet TAKE 3 TABLETS(90 MG) BY MOUTH DAILY 270 tablet 3   nitroGLYCERIN  (NITROSTAT ) 0.4 MG SL tablet Place 1 tablet (0.4 mg total) under the tongue every 5 (five) minutes as needed for chest pain. 25 tablet 3   rosuvastatin  (CRESTOR ) 10 MG tablet Take 1 tablet (10 mg total) by mouth daily. 90 tablet 3   sevelamer carbonate (RENVELA) 800 MG tablet Take 800 mg by mouth 3 (three) times daily with meals.     Spacer/Aero-Holding Chambers (PRO COMFORT SPACER ADULT) MISC Use spacer as needed with inhalers. 1 each 0   No current facility-administered medications for this visit.    Family History  Adopted: Yes  Problem Relation Age of Onset   Diabetes Mother    Hypertension Mother    Kidney disease Mother    Cancer Father    Diabetes Sister    Hypertension Sister     Social History   Socioeconomic History   Marital status: Married    Spouse name: Not on file   Number of children: Not on file   Years of education: Not on file   Highest education level: Not on file  Occupational History   Not on file  Tobacco Use   Smoking status: Former    Current packs/day: 0.00    Types: Cigarettes    Quit date: 01/30/2022    Years  since quitting: 1.9   Smokeless tobacco: Never  Vaping Use   Vaping status: Never Used  Substance and Sexual Activity   Alcohol use: Not Currently   Drug use: No   Sexual activity: Not on file  Other Topics Concern   Not on file  Social History Narrative   Not on file   Social Drivers of Health   Financial Resource Strain: Not on file  Food Insecurity: No Food Insecurity (08/18/2023)   Hunger Vital Sign    Worried About Running Out of Food in the Last Year: Never true    Ran Out of Food in the Last Year: Never true  Transportation Needs: No Transportation Needs (08/18/2023)   PRAPARE - Transportation  Lack of Transportation (Medical): No    Lack of Transportation (Non-Medical): No  Physical Activity: Not on file  Stress: Not on file  Social Connections: Moderately Isolated (02/23/2022)   Social Connection and Isolation Panel    Frequency of Communication with Friends and Family: More than three times a week    Frequency of Social Gatherings with Friends and Family: More than three times a week    Attends Religious Services: Never    Database Administrator or Organizations: No    Attends Banker Meetings: Never    Marital Status: Married  Catering Manager Violence: Not At Risk (08/18/2023)   Humiliation, Afraid, Rape, and Kick questionnaire    Fear of Current or Ex-Partner: No    Emotionally Abused: No    Physically Abused: No    Sexually Abused: No     REVIEW OF SYSTEMS:   [X]  denotes positive finding, [ ]  denotes negative finding Cardiac  Comments:  Chest pain or chest pressure:    Shortness of breath upon exertion:    Short of breath when lying flat:    Irregular heart rhythm:        Vascular    Pain in calf, thigh, or hip brought on by ambulation:    Pain in feet at night that wakes you up from your sleep:     Blood clot in your veins:    Leg swelling:         Pulmonary    Oxygen at home:    Productive cough:     Wheezing:         Neurologic     Sudden weakness in arms or legs:     Sudden numbness in arms or legs:     Sudden onset of difficulty speaking or slurred speech:    Temporary loss of vision in one eye:     Problems with dizziness:         Gastrointestinal    Blood in stool:     Vomited blood:         Genitourinary    Burning when urinating:     Blood in urine:        Psychiatric    Major depression:         Hematologic    Bleeding problems:    Problems with blood clotting too easily:        Skin    Rashes or ulcers: x       Constitutional    Fever or chills:      PHYSICAL EXAMINATION:  There were no vitals filed for this visit.  There is no height or weight on file to calculate BMI.   General:  WDWN in NAD; vital signs documented above Gait: Not observed HENT: WNL, normocephalic Pulmonary: normal non-labored breathing , without wheezing Cardiac: regular HR, without carotid bruits Skin: without rashes Vascular Exam/Pulses:  Right Left  DP Brisk doppler flow Brisk doppler flow  PT Brisk doppler flow Brisk doppler flow  Peroneal Brisk doppler flow Brisk doppler flow   Extremities: without ischemic changes, without Gangrene , without cellulitis; with open wounds; excellent thrill in left arm fistula Musculoskeletal: no muscle wasting or atrophy  Neurologic: A&O X 3 Psychiatric:  The pt has Normal affect.   +-------+-----------+-----------+------------+------------+  ABI/TBIToday's ABIToday's TBIPrevious ABIPrevious TBI  +-------+-----------+-----------+------------+------------+  Right [place]                    [place]  0.41          +-------+-----------+-----------+------------+------------+  Left  1.01                  [place]          0.23          +-------+-----------+-----------+------------+------------+     ASSESSMENT/PLAN:: 54 y.o. male here for follow up for PAD with hx of aortogram LLE via right CFA with balloon lithotripsy left TPT, peroneal artery and PTA 04/19/2023  by Dr. Lanis.  He subsequently underwent aortogram RLE with balloon angioplasty right SFA, drug coated balloon angioplasty right SFA via left CFA 06/07/2023.  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  PAD -pt with brisk doppler flow bilateral feet.  Wounds have completely healed -ABIs nondiagnostic due to calcific disease, but no major changes. -continue asa/statin/plavix   ESRD -fistula working well with good thrill -discussed access does not last forever and at some point will need intervention or even new access.  He and his wife expressed understanding.  -pt will f/u in 6 months with ABI.

## 2024-01-18 ENCOUNTER — Ambulatory Visit (HOSPITAL_BASED_OUTPATIENT_CLINIC_OR_DEPARTMENT_OTHER)
Admission: RE | Admit: 2024-01-18 | Discharge: 2024-01-18 | Disposition: A | Discharge status: Home / Self Care | Source: Ambulatory Visit | Attending: Vascular Surgery | Admitting: Vascular Surgery

## 2024-01-18 ENCOUNTER — Encounter: Payer: Self-pay | Admitting: Vascular Surgery

## 2024-01-18 ENCOUNTER — Ambulatory Visit: Admitting: Vascular Surgery

## 2024-01-18 ENCOUNTER — Ambulatory Visit (HOSPITAL_COMMUNITY)
Admission: RE | Admit: 2024-01-18 | Discharge: 2024-01-18 | Disposition: A | Discharge status: Home / Self Care | Source: Ambulatory Visit | Attending: Vascular Surgery | Admitting: Vascular Surgery

## 2024-01-18 VITALS — BP 150/73 | HR 58 | Temp 98.2°F | Resp 18 | Ht 67.0 in | Wt 196.6 lb

## 2024-01-18 DIAGNOSIS — N186 End stage renal disease: Secondary | ICD-10-CM

## 2024-01-18 DIAGNOSIS — Z992 Dependence on renal dialysis: Secondary | ICD-10-CM | POA: Diagnosis present

## 2024-01-18 DIAGNOSIS — I70235 Atherosclerosis of native arteries of right leg with ulceration of other part of foot: Secondary | ICD-10-CM | POA: Insufficient documentation

## 2024-01-18 DIAGNOSIS — Z9889 Other specified postprocedural states: Secondary | ICD-10-CM | POA: Insufficient documentation

## 2024-01-18 DIAGNOSIS — I739 Peripheral vascular disease, unspecified: Secondary | ICD-10-CM | POA: Diagnosis present

## 2024-01-18 LAB — VAS US ABI WITH/WO TBI: Left ABI: 1.04

## 2024-01-19 ENCOUNTER — Other Ambulatory Visit: Payer: Self-pay | Admitting: *Deleted

## 2024-01-19 DIAGNOSIS — I70223 Atherosclerosis of native arteries of extremities with rest pain, bilateral legs: Secondary | ICD-10-CM

## 2024-01-19 DIAGNOSIS — I739 Peripheral vascular disease, unspecified: Secondary | ICD-10-CM

## 2024-01-30 ENCOUNTER — Telehealth: Admitting: Student

## 2024-01-30 DIAGNOSIS — N186 End stage renal disease: Secondary | ICD-10-CM | POA: Diagnosis not present

## 2024-01-30 DIAGNOSIS — I739 Peripheral vascular disease, unspecified: Secondary | ICD-10-CM | POA: Diagnosis not present

## 2024-01-30 DIAGNOSIS — E1122 Type 2 diabetes mellitus with diabetic chronic kidney disease: Secondary | ICD-10-CM

## 2024-01-30 DIAGNOSIS — M25511 Pain in right shoulder: Secondary | ICD-10-CM

## 2024-01-30 DIAGNOSIS — I120 Hypertensive chronic kidney disease with stage 5 chronic kidney disease or end stage renal disease: Secondary | ICD-10-CM | POA: Diagnosis not present

## 2024-01-30 DIAGNOSIS — M25531 Pain in right wrist: Secondary | ICD-10-CM

## 2024-01-30 DIAGNOSIS — Z992 Dependence on renal dialysis: Secondary | ICD-10-CM

## 2024-01-30 NOTE — Progress Notes (Signed)
 Virtual Visit via Video Note  I connected with Samuel Gardner on 01/30/24 at 10:15 AM EST by a video enabled telemedicine application and verified that I am speaking with the correct person using two identifiers.  Patient Location: Home Provider Location: Home Office  I discussed the limitations, risks, security, and privacy concerns of performing an evaluation and management service by video and the availability of in person appointments. I also discussed with the patient that there may be a patient responsible charge related to this service. The patient expressed understanding and agreed to proceed.  Subjective: PCP: [name], DO  Follow-up  [name] is a 53 y.o. male living with a history stated below and presents today for follow-up. Please see problem based assessment and plan for additional details.   Past Medical History:  Diagnosis Date   (HFpEF) heart failure with preserved ejection fraction (HCC)    Ascending aorta dilatation    40mm by echo 11/2022   Diabetes mellitus without complication (HCC)    ESRD on hemodialysis (HCC)    Hypertension    Mitral regurgitation    mild by echo 11/2022   Normocytic anemia    Peripheral vascular disease       ROS: Per HPI  Current Outpatient Medications:    acetaminophen  (TYLENOL ) 500 MG tablet, Take 1,000 mg by mouth every 6 (six) hours as needed for mild pain (pain score 1-3) or headache., Disp: , Rfl:    albuterol  (VENTOLIN  HFA) 108 (90 Base) MCG/ACT inhaler, Inhale 2 puffs into the lungs every 6 (six) hours as needed for wheezing or shortness of breath., Disp: 6.7 g, Rfl: 2   amLODipine  (NORVASC ) 5 MG tablet, Take 1 tablet (5 mg total) by mouth daily., Disp: 90 tablet, Rfl: 3   ASPIRIN  LOW DOSE 81 MG tablet, TAKE 1 TABLET(81 MG) BY MOUTH DAILY. SWALLOW WHOLE, Disp: 90 tablet, Rfl: 0   carvedilol  (COREG ) 25 MG tablet, TAKE 1 TABLET 2 TIMES DAILY, Disp: 60 tablet, Rfl: 9   clopidogrel  (PLAVIX ) 75 MG tablet,  Take 1 tablet (75 mg total) by mouth daily., Disp: 90 tablet, Rfl: 3   furosemide  (LASIX ) 80 MG tablet, TAKE 1 TABLET BY MOUTH TWICE DAILY, Disp: 218 tablet, Rfl: 1   gabapentin  (NEURONTIN ) 300 MG capsule, TAKE 1 CAPSULE BY MOUTH AT BEDTIME, Disp: 30 capsule, Rfl: 2   hydrALAZINE  (APRESOLINE ) 100 MG tablet, Take 1 tablet (100 mg total) by mouth 3 (three) times daily., Disp: 270 tablet, Rfl: 3   isosorbide  mononitrate (IMDUR ) 30 MG 24 hr tablet, TAKE 3 TABLETS(90 MG) BY MOUTH DAILY, Disp: 270 tablet, Rfl: 3   nitroGLYCERIN  (NITROSTAT ) 0.4 MG SL tablet, Place 1 tablet (0.4 mg total) under the tongue every 5 (five) minutes as needed for chest pain., Disp: 25 tablet, Rfl: 3   rosuvastatin  (CRESTOR ) 10 MG tablet, Take 1 tablet (10 mg total) by mouth daily., Disp: 90 tablet, Rfl: 3   sevelamer carbonate (RENVELA) 800 MG tablet, Take 800 mg by mouth 3 (three) times daily with meals., Disp: , Rfl:    Spacer/Aero-Holding Chambers (PRO COMFORT SPACER ADULT) MISC, Use spacer as needed with inhalers., Disp: 1 each, Rfl: 0  Observations/Objective: There were no vitals filed for this visit. Physical Exam  Assessment and Plan 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) A1c 10/2023 was is 6.7.  Managed with lifestyle modifications which he continues to improve upon. Will check A1c at follow-up.   PAD (peripheral artery disease) (HCC)  Follows with vascular surgery.  Denies claudication.  Continue Plavix  and ASA.   Essential hypertension Will check BP at follow-up but home readings consistently 120-130's. For now continue Norvasc  5 mg daily, Coreg  25 mg daily, furosemide  80 mg twice daily, hydralazine  100 mg 3 times daily, Imdur  30 mg daily.    ESRD on dialysis (HCC) Compliant with MWF schedule.  Has appointment with [facility] on 11/20 for transplant candidacy,  Right Shoulder/Wrist Pain Started a few weeks ago. No trauma/inciting event. Worse when laying on it. No overt weakness but pain  at times causes weakness. Explained the limitations in evaluation without being seen in person. Will follow-up next week in person and will use Tylenol /Voltaren gel in the meantime.     Follow Up Instructions: Call clinic for in-person visit within the next week or two     The patient was advised to call back or seek an in-person evaluation if the symptoms worsen or if the condition fails to improve as anticipated.    Patient discussed with Dr. Jeanelle Norman Lobstein, DO

## 2024-02-01 ENCOUNTER — Encounter: Payer: Self-pay | Admitting: Student

## 2024-02-05 ENCOUNTER — Other Ambulatory Visit: Payer: Self-pay

## 2024-02-05 DIAGNOSIS — I5032 Chronic diastolic (congestive) heart failure: Secondary | ICD-10-CM

## 2024-02-05 DIAGNOSIS — R079 Chest pain, unspecified: Secondary | ICD-10-CM

## 2024-02-06 ENCOUNTER — Ambulatory Visit: Payer: Self-pay | Admitting: Student

## 2024-02-07 MED ORDER — CARVEDILOL 25 MG PO TABS: 25.0000 mg | ORAL_TABLET | Freq: Two times a day (BID) | ORAL | 3 refills | Status: AC

## 2024-02-08 ENCOUNTER — Other Ambulatory Visit: Payer: Self-pay | Admitting: Student

## 2024-02-12 NOTE — Telephone Encounter (Signed)
 Medication sent to pharmacy

## 2024-02-13 ENCOUNTER — Ambulatory Visit: Payer: Self-pay | Admitting: Student

## 2024-02-13 VITALS — BP 151/73 | HR 57 | Temp 97.9°F | Ht 67.0 in | Wt 195.6 lb

## 2024-02-13 DIAGNOSIS — N186 End stage renal disease: Secondary | ICD-10-CM

## 2024-02-13 DIAGNOSIS — I10 Essential (primary) hypertension: Secondary | ICD-10-CM

## 2024-02-13 DIAGNOSIS — E1122 Type 2 diabetes mellitus with diabetic chronic kidney disease: Secondary | ICD-10-CM | POA: Diagnosis not present

## 2024-02-13 DIAGNOSIS — Z23 Encounter for immunization: Secondary | ICD-10-CM | POA: Diagnosis not present

## 2024-02-13 DIAGNOSIS — I120 Hypertensive chronic kidney disease with stage 5 chronic kidney disease or end stage renal disease: Secondary | ICD-10-CM | POA: Diagnosis not present

## 2024-02-13 DIAGNOSIS — M25531 Pain in right wrist: Secondary | ICD-10-CM | POA: Diagnosis not present

## 2024-02-13 DIAGNOSIS — Z0000 Encounter for general adult medical examination without abnormal findings: Secondary | ICD-10-CM

## 2024-02-13 DIAGNOSIS — Z992 Dependence on renal dialysis: Secondary | ICD-10-CM

## 2024-02-13 LAB — POCT GLYCOSYLATED HEMOGLOBIN (HGB A1C): HbA1c, POC (controlled diabetic range): 6.1 % (ref 0.0–7.0)

## 2024-02-13 LAB — GLUCOSE, CAPILLARY: Glucose-Capillary: 137 mg/dL — ABNORMAL HIGH (ref 70–99)

## 2024-02-13 NOTE — Progress Notes (Signed)
 Internal Medicine Clinic Attending  Case discussed with the resident at the time of the visit.  We reviewed the resident's history and exam and pertinent patient test results.  I agree with the assessment, diagnosis, and plan of care documented in the resident's note.

## 2024-02-13 NOTE — Progress Notes (Unsigned)
 CC: Follow-up  HPI:  [name] is a 53 y.o. male living with a history stated below and presents today for follow-up. Please see problem based assessment and plan for additional details.  Past Medical History:  Diagnosis Date   (HFpEF) heart failure with preserved ejection fraction (HCC)    Ascending aorta dilatation    40mm by echo 11/2022   Diabetes mellitus without complication (HCC)    ESRD on hemodialysis (HCC)    Hypertension    Mitral regurgitation    mild by echo 11/2022   Normocytic anemia    Peripheral vascular disease     Current Outpatient Medications on File Prior to Visit  Medication Sig Dispense Refill   acetaminophen  (TYLENOL ) 500 MG tablet Take 1,000 mg by mouth every 6 (six) hours as needed for mild pain (pain score 1-3) or headache.     albuterol  (VENTOLIN  HFA) 108 (90 Base) MCG/ACT inhaler Inhale 2 puffs into the lungs every 6 (six) hours as needed for wheezing or shortness of breath. 6.7 g 2   amLODipine  (NORVASC ) 5 MG tablet TAKE 1 TABLET(5 MG) BY MOUTH DAILY 90 tablet 3   ASPIRIN  LOW DOSE 81 MG tablet TAKE 1 TABLET(81 MG) BY MOUTH DAILY. SWALLOW WHOLE 90 tablet 0   carvedilol  (COREG ) 25 MG tablet Take 1 tablet (25 mg total) by mouth 2 (two) times daily with a meal. 180 tablet 3   clopidogrel  (PLAVIX ) 75 MG tablet Take 1 tablet (75 mg total) by mouth daily. 90 tablet 3   furosemide  (LASIX ) 80 MG tablet TAKE 1 TABLET BY MOUTH TWICE DAILY 218 tablet 1   gabapentin  (NEURONTIN ) 300 MG capsule TAKE 1 CAPSULE BY MOUTH AT BEDTIME 30 capsule 2   hydrALAZINE  (APRESOLINE ) 100 MG tablet Take 1 tablet (100 mg total) by mouth 3 (three) times daily. 270 tablet 3   isosorbide  mononitrate (IMDUR ) 30 MG 24 hr tablet TAKE 3 TABLETS(90 MG) BY MOUTH DAILY 270 tablet 3   nitroGLYCERIN  (NITROSTAT ) 0.4 MG SL tablet Place 1 tablet (0.4 mg total) under the tongue every 5 (five) minutes as needed for chest pain. 25 tablet 3   rosuvastatin  (CRESTOR ) 10 MG tablet Take 1 tablet  (10 mg total) by mouth daily. 90 tablet 3   sevelamer carbonate (RENVELA) 800 MG tablet Take 800 mg by mouth 3 (three) times daily with meals.     Spacer/Aero-Holding Chambers (PRO COMFORT SPACER ADULT) MISC Use spacer as needed with inhalers. 1 each 0   No current facility-administered medications on file prior to visit.    Family History  Adopted: Yes  Problem Relation Age of Onset   Diabetes Mother    Hypertension Mother    Kidney disease Mother    Cancer Father    Diabetes Sister    Hypertension Sister     Social History   Socioeconomic History   Marital status: Married    Spouse name: Not on file   Number of children: Not on file   Years of education: Not on file   Highest education level: Not on file  Occupational History   Not on file  Tobacco Use   Smoking status: Former    Current packs/day: 0.00    Types: Cigarettes    Quit date: 01/30/2022    Years since quitting: 2.0   Smokeless tobacco: Never  Vaping Use   Vaping status: Never Used  Substance and Sexual Activity   Alcohol use: Not Currently   Drug use: No  Sexual activity: Not on file  Other Topics Concern   Not on file  Social History Narrative   Not on file   Social Drivers of Health   Financial Resource Strain: Not on file  Food Insecurity: No Food Insecurity (08/18/2023)   Hunger Vital Sign    Worried About Running Out of Food in the Last Year: Never true    Ran Out of Food in the Last Year: Never true  Transportation Needs: No Transportation Needs (08/18/2023)   PRAPARE - Administrator, Civil Service (Medical): No    Lack of Transportation (Non-Medical): No  Physical Activity: Not on file  Stress: Not on file  Social Connections: Moderately Isolated (02/23/2022)   Social Connection and Isolation Panel    Frequency of Communication with Friends and Family: More than three times a week    Frequency of Social Gatherings with Friends and Family: More than three times a week     Attends Religious Services: Never    Database Administrator or Organizations: No    Attends Banker Meetings: Never    Marital Status: Married  Catering Manager Violence: Not At Risk (08/18/2023)   Humiliation, Afraid, Rape, and Kick questionnaire    Fear of Current or Ex-Partner: No    Emotionally Abused: No    Physically Abused: No    Sexually Abused: No    Review of Systems: ROS negative except for what is noted on the assessment and plan.  Vitals:   02/13/24 1038 02/13/24 1119  BP: (!) 156/68 (!) 151/73  Pulse: 62 (!) 57  Temp: 97.9 F (36.6 C)   TempSrc: Oral   SpO2: 100%   Weight: 195 lb 9.6 oz (88.7 kg)   Height: 5' 7 (1.702 m)     Physical Exam: Constitutional: well-appearing, sitting in chair, in no acute distress Cardiovascular: regular rate and rhythm, no m/r/g Pulmonary/Chest: normal work of breathing on room air, lungs clear to auscultation bilaterally Abdominal: soft, non-tender, non-distended MSK: normal bulk and tone Skin: warm and dry Psych: normal mood and behavior  Assessment & Plan:  Patient discussed with Dr. Jeanelle  Wrist pain, acute, right Ongoing for the past few weeks.  No fall/trauma.  Pain is specifically over the lateral wrist near the anatomical snuffbox.  Finkelstein maneuver positive.  Presentation consistent with de Quervain's tenosynovitis.  Will opt for conservative measures for now and will escalate care if refractory.  Essential hypertension BP is 156/68 and 151/73.  This is a Tuesday and he does have dialysis MWF.  Also takes Norvasc  5 mg daily, Coreg  25 mg daily, furosemide  80 mg twice daily, hydralazine  100 mg 3 times daily, Imdur  30 mg daily.  I do wonder if there is a secondary component of hypertension and will perhaps address this in the future but there are a lot of moving parts right now and his healthcare so we will defer for now.  Ambulatory readings have been in 120s-130s.  Denies hypertensive symptoms.  ESRD  on dialysis (HCC) Compliant with MWF schedule.  No signs of volume overload.  Labs have been stable.  He is going to [facility] today for transplant candidacy meeting.  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) A1c decreased to 6.1. Diet controlled. Continue lifestyle modifications.   Healthcare maintenance Flu vaccine today.   Norman Lobstein, D.O. [facility] Internal Medicine, PGY-2 Phone: [phone] Date 02/14/2024 Time 3:13 PM

## 2024-02-14 DIAGNOSIS — M25531 Pain in right wrist: Secondary | ICD-10-CM | POA: Insufficient documentation

## 2024-02-14 NOTE — Assessment & Plan Note (Signed)
 Compliant with MWF schedule.  No signs of volume overload.  Labs have been stable.  He is going to [facility] today for transplant candidacy meeting.

## 2024-02-14 NOTE — Assessment & Plan Note (Signed)
 Ongoing for the past few weeks.  No fall/trauma.  Pain is specifically over the lateral wrist near the anatomical snuffbox.  Finkelstein maneuver positive.  Presentation consistent with de Quervain's tenosynovitis.  Will opt for conservative measures for now and will escalate care if refractory.

## 2024-02-14 NOTE — Assessment & Plan Note (Signed)
 BP is 156/68 and 151/73.  This is a Tuesday and he does have dialysis MWF.  Also takes Norvasc  5 mg daily, Coreg  25 mg daily, furosemide  80 mg twice daily, hydralazine  100 mg 3 times daily, Imdur  30 mg daily.  I do wonder if there is a secondary component of hypertension and will perhaps address this in the future but there are a lot of moving parts right now and his healthcare so we will defer for now.  Ambulatory readings have been in 120s-130s.  Denies hypertensive symptoms.

## 2024-02-14 NOTE — Assessment & Plan Note (Signed)
 A1c decreased to 6.1. Diet controlled. Continue lifestyle modifications.

## 2024-02-14 NOTE — Assessment & Plan Note (Signed)
 Flu vaccine today

## 2024-02-15 NOTE — Progress Notes (Signed)
 Internal Medicine Clinic Attending  Case discussed with the resident at the time of the visit.  We reviewed the resident's history and exam and pertinent patient test results.  I agree with the assessment, diagnosis, and plan of care documented in the resident's note.

## 2024-02-20 ENCOUNTER — Other Ambulatory Visit: Payer: Self-pay | Admitting: Student

## 2024-02-21 ENCOUNTER — Other Ambulatory Visit: Payer: Self-pay | Admitting: Student

## 2024-02-21 ENCOUNTER — Encounter: Payer: Self-pay | Admitting: Student

## 2024-02-27 ENCOUNTER — Ambulatory Visit: Payer: Self-pay

## 2024-02-27 DIAGNOSIS — Z23 Encounter for immunization: Secondary | ICD-10-CM

## 2024-03-01 ENCOUNTER — Encounter: Payer: Self-pay | Admitting: Student

## 2024-03-01 DIAGNOSIS — M79604 Pain in right leg: Secondary | ICD-10-CM

## 2024-03-04 MED ORDER — GABAPENTIN 300 MG PO CAPS: 300.0000 mg | ORAL_CAPSULE | Freq: Every day | ORAL | 2 refills | Status: DC

## 2024-03-04 MED ORDER — NITROGLYCERIN 0.4 MG SL SUBL: 0.4000 mg | SUBLINGUAL_TABLET | SUBLINGUAL | 3 refills | Status: AC | PRN

## 2024-03-05 MED ORDER — GABAPENTIN 300 MG PO CAPS: 300.0000 mg | ORAL_CAPSULE | Freq: Every day | ORAL | 0 refills | Status: AC

## 2024-03-05 NOTE — Telephone Encounter (Signed)
 Copied from CRM #[id number]. Topic: Clinical - Prescription Issue >> Mar 05, 2024  8:32 AM Diannia H wrote: Reason for CRM: Patients wife sent the message below and is needing a follow up. She states the patient will need his meds today after Dialysis. He's completely out Could you please follow up with the patients wife [phone].   Afternoon Dr marylu I contacted the pharmacy and they did receive the gabapentin  prescription however you only sent a 30-day script and our insurance will only cover if it's 90 days can this be updated.

## 2024-04-02 ENCOUNTER — Other Ambulatory Visit: Payer: Self-pay | Admitting: Physician Assistant

## 2024-04-02 DIAGNOSIS — I2481 Acute coronary microvascular dysfunction: Secondary | ICD-10-CM

## 2024-05-21 ENCOUNTER — Ambulatory Visit: Admitting: Podiatry

## 2024-07-18 ENCOUNTER — Ambulatory Visit

## 2024-07-18 ENCOUNTER — Ambulatory Visit (HOSPITAL_COMMUNITY)
